# Patient Record
Sex: Female | Born: 1975
Health system: Southern US, Community
[De-identification: ages and names within clinical notes are randomized; demographics above are authoritative.]

## PROBLEM LIST (undated history)

## (undated) DIAGNOSIS — E669 Obesity, unspecified: Secondary | ICD-10-CM

## (undated) DIAGNOSIS — R42 Dizziness and giddiness: Secondary | ICD-10-CM

## (undated) DIAGNOSIS — K5909 Other constipation: Secondary | ICD-10-CM

## (undated) DIAGNOSIS — J45909 Unspecified asthma, uncomplicated: Secondary | ICD-10-CM

## (undated) DIAGNOSIS — F339 Major depressive disorder, recurrent, unspecified: Secondary | ICD-10-CM

## (undated) DIAGNOSIS — F411 Generalized anxiety disorder: Secondary | ICD-10-CM

## (undated) DIAGNOSIS — F4310 Post-traumatic stress disorder, unspecified: Secondary | ICD-10-CM

## (undated) DIAGNOSIS — R7309 Other abnormal glucose: Secondary | ICD-10-CM

## (undated) DIAGNOSIS — D219 Benign neoplasm of connective and other soft tissue, unspecified: Secondary | ICD-10-CM

## (undated) DIAGNOSIS — E0590 Thyrotoxicosis, unspecified without thyrotoxic crisis or storm: Secondary | ICD-10-CM

## (undated) HISTORY — DX: Major depressive disorder, recurrent, unspecified: F33.9

## (undated) HISTORY — DX: Generalized anxiety disorder: F41.1

## (undated) HISTORY — DX: Thyrotoxicosis, unspecified without thyrotoxic crisis or storm: E05.90

## (undated) HISTORY — DX: Dizziness and giddiness: R42

## (undated) HISTORY — DX: Post-traumatic stress disorder, unspecified: F43.10

## (undated) HISTORY — DX: Other constipation: K59.09

## (undated) HISTORY — DX: Unspecified asthma, uncomplicated: J45.909

## (undated) HISTORY — PX: PILONIDAL CYST EXCISION: SHX744

## (undated) HISTORY — DX: Other abnormal glucose: R73.09

## (undated) HISTORY — DX: Obesity, unspecified: E66.9

---

## 1998-02-25 ENCOUNTER — Inpatient Hospital Stay (HOSPITAL_COMMUNITY): Admission: AD | Admit: 1998-02-25 | Discharge: 1998-02-25 | Payer: Self-pay | Admitting: Obstetrics & Gynecology

## 1998-03-16 ENCOUNTER — Inpatient Hospital Stay (HOSPITAL_COMMUNITY): Admission: AD | Admit: 1998-03-16 | Discharge: 1998-03-16 | Payer: Self-pay | Admitting: *Deleted

## 1998-04-06 ENCOUNTER — Inpatient Hospital Stay (HOSPITAL_COMMUNITY): Admission: AD | Admit: 1998-04-06 | Discharge: 1998-04-06 | Payer: Self-pay | Admitting: *Deleted

## 1998-04-09 ENCOUNTER — Inpatient Hospital Stay (HOSPITAL_COMMUNITY): Admission: AD | Admit: 1998-04-09 | Discharge: 1998-04-11 | Payer: Self-pay | Admitting: Obstetrics

## 1998-04-16 ENCOUNTER — Inpatient Hospital Stay (HOSPITAL_COMMUNITY): Admission: AD | Admit: 1998-04-16 | Discharge: 1998-04-16 | Payer: Self-pay | Admitting: Obstetrics

## 1998-04-23 ENCOUNTER — Inpatient Hospital Stay (HOSPITAL_COMMUNITY): Admission: AD | Admit: 1998-04-23 | Discharge: 1998-04-23 | Payer: Self-pay | Admitting: Obstetrics & Gynecology

## 1998-05-14 ENCOUNTER — Inpatient Hospital Stay (HOSPITAL_COMMUNITY): Admission: AD | Admit: 1998-05-14 | Discharge: 1998-05-14 | Payer: Self-pay | Admitting: *Deleted

## 1998-05-17 ENCOUNTER — Inpatient Hospital Stay (HOSPITAL_COMMUNITY): Admission: AD | Admit: 1998-05-17 | Discharge: 1998-05-17 | Payer: Self-pay | Admitting: *Deleted

## 1998-06-07 ENCOUNTER — Inpatient Hospital Stay (HOSPITAL_COMMUNITY): Admission: AD | Admit: 1998-06-07 | Discharge: 1998-06-07 | Payer: Self-pay | Admitting: *Deleted

## 1998-06-08 ENCOUNTER — Inpatient Hospital Stay (HOSPITAL_COMMUNITY): Admission: AD | Admit: 1998-06-08 | Discharge: 1998-06-08 | Payer: Self-pay | Admitting: *Deleted

## 1998-06-24 ENCOUNTER — Inpatient Hospital Stay (HOSPITAL_COMMUNITY): Admission: AD | Admit: 1998-06-24 | Discharge: 1998-06-27 | Payer: Self-pay | Admitting: Obstetrics & Gynecology

## 1998-11-01 ENCOUNTER — Emergency Department (HOSPITAL_COMMUNITY): Admission: EM | Admit: 1998-11-01 | Discharge: 1998-11-01 | Payer: Self-pay | Admitting: Emergency Medicine

## 1999-09-20 ENCOUNTER — Emergency Department (HOSPITAL_COMMUNITY): Admission: EM | Admit: 1999-09-20 | Discharge: 1999-09-20 | Payer: Self-pay | Admitting: Emergency Medicine

## 1999-10-01 ENCOUNTER — Inpatient Hospital Stay (HOSPITAL_COMMUNITY): Admission: AD | Admit: 1999-10-01 | Discharge: 1999-10-01 | Payer: Self-pay | Admitting: *Deleted

## 1999-10-11 ENCOUNTER — Inpatient Hospital Stay (HOSPITAL_COMMUNITY): Admission: AD | Admit: 1999-10-11 | Discharge: 1999-10-11 | Payer: Self-pay | Admitting: *Deleted

## 1999-11-11 ENCOUNTER — Inpatient Hospital Stay (HOSPITAL_COMMUNITY): Admission: AD | Admit: 1999-11-11 | Discharge: 1999-11-11 | Payer: Self-pay | Admitting: *Deleted

## 2000-02-05 ENCOUNTER — Inpatient Hospital Stay (HOSPITAL_COMMUNITY): Admission: AD | Admit: 2000-02-05 | Discharge: 2000-02-05 | Payer: Self-pay | Admitting: *Deleted

## 2001-11-23 ENCOUNTER — Emergency Department (HOSPITAL_COMMUNITY): Admission: EM | Admit: 2001-11-23 | Discharge: 2001-11-24 | Payer: Self-pay | Admitting: Emergency Medicine

## 2002-05-04 ENCOUNTER — Emergency Department (HOSPITAL_COMMUNITY): Admission: EM | Admit: 2002-05-04 | Discharge: 2002-05-04 | Payer: Self-pay | Admitting: Emergency Medicine

## 2003-02-25 ENCOUNTER — Emergency Department (HOSPITAL_COMMUNITY): Admission: EM | Admit: 2003-02-25 | Discharge: 2003-02-26 | Payer: Self-pay | Admitting: Emergency Medicine

## 2003-03-29 ENCOUNTER — Inpatient Hospital Stay (HOSPITAL_COMMUNITY): Admission: AD | Admit: 2003-03-29 | Discharge: 2003-03-29 | Payer: Self-pay | Admitting: Obstetrics

## 2003-03-31 ENCOUNTER — Inpatient Hospital Stay (HOSPITAL_COMMUNITY): Admission: AD | Admit: 2003-03-31 | Discharge: 2003-03-31 | Payer: Self-pay | Admitting: Obstetrics

## 2003-04-01 ENCOUNTER — Ambulatory Visit (HOSPITAL_COMMUNITY): Admission: RE | Admit: 2003-04-01 | Discharge: 2003-04-01 | Payer: Self-pay | Admitting: Obstetrics

## 2003-04-08 ENCOUNTER — Ambulatory Visit (HOSPITAL_COMMUNITY): Admission: RE | Admit: 2003-04-08 | Discharge: 2003-04-08 | Payer: Self-pay | Admitting: Family Medicine

## 2003-04-10 ENCOUNTER — Inpatient Hospital Stay (HOSPITAL_COMMUNITY): Admission: AD | Admit: 2003-04-10 | Discharge: 2003-04-11 | Payer: Self-pay | Admitting: Obstetrics

## 2003-04-16 ENCOUNTER — Inpatient Hospital Stay (HOSPITAL_COMMUNITY): Admission: AD | Admit: 2003-04-16 | Discharge: 2003-04-18 | Payer: Self-pay | Admitting: Obstetrics

## 2003-04-30 ENCOUNTER — Inpatient Hospital Stay (HOSPITAL_COMMUNITY): Admission: AD | Admit: 2003-04-30 | Discharge: 2003-05-05 | Payer: Self-pay | Admitting: Obstetrics

## 2003-05-20 ENCOUNTER — Inpatient Hospital Stay (HOSPITAL_COMMUNITY): Admission: AD | Admit: 2003-05-20 | Discharge: 2003-05-25 | Payer: Self-pay | Admitting: Obstetrics

## 2003-08-26 ENCOUNTER — Inpatient Hospital Stay (HOSPITAL_COMMUNITY): Admission: AD | Admit: 2003-08-26 | Discharge: 2003-08-26 | Payer: Self-pay | Admitting: Obstetrics

## 2003-10-02 ENCOUNTER — Observation Stay (HOSPITAL_COMMUNITY): Admission: AD | Admit: 2003-10-02 | Discharge: 2003-10-03 | Payer: Self-pay | Admitting: Obstetrics

## 2003-10-13 ENCOUNTER — Ambulatory Visit (HOSPITAL_COMMUNITY): Admission: RE | Admit: 2003-10-13 | Discharge: 2003-10-13 | Payer: Self-pay | Admitting: Obstetrics

## 2003-11-02 ENCOUNTER — Inpatient Hospital Stay (HOSPITAL_COMMUNITY): Admission: AD | Admit: 2003-11-02 | Discharge: 2003-11-02 | Payer: Self-pay | Admitting: Obstetrics

## 2003-11-16 ENCOUNTER — Inpatient Hospital Stay (HOSPITAL_COMMUNITY): Admission: AD | Admit: 2003-11-16 | Discharge: 2003-11-18 | Payer: Self-pay | Admitting: Obstetrics

## 2004-02-23 ENCOUNTER — Ambulatory Visit: Payer: Self-pay | Admitting: Family Medicine

## 2004-03-25 ENCOUNTER — Ambulatory Visit: Payer: Self-pay | Admitting: Sports Medicine

## 2004-03-30 ENCOUNTER — Inpatient Hospital Stay (HOSPITAL_COMMUNITY): Admission: AD | Admit: 2004-03-30 | Discharge: 2004-03-30 | Payer: Self-pay | Admitting: Obstetrics and Gynecology

## 2004-04-01 ENCOUNTER — Ambulatory Visit: Payer: Self-pay | Admitting: Sports Medicine

## 2004-05-10 ENCOUNTER — Ambulatory Visit: Payer: Self-pay | Admitting: Family Medicine

## 2004-06-14 ENCOUNTER — Ambulatory Visit: Payer: Self-pay | Admitting: Family Medicine

## 2004-06-29 ENCOUNTER — Emergency Department (HOSPITAL_COMMUNITY): Admission: EM | Admit: 2004-06-29 | Discharge: 2004-06-29 | Payer: Self-pay | Admitting: Emergency Medicine

## 2004-08-02 ENCOUNTER — Ambulatory Visit: Payer: Self-pay | Admitting: Family Medicine

## 2004-08-08 ENCOUNTER — Emergency Department (HOSPITAL_COMMUNITY): Admission: EM | Admit: 2004-08-08 | Discharge: 2004-08-08 | Payer: Self-pay | Admitting: Emergency Medicine

## 2004-08-09 ENCOUNTER — Emergency Department (HOSPITAL_COMMUNITY): Admission: EM | Admit: 2004-08-09 | Discharge: 2004-08-09 | Payer: Self-pay | Admitting: Emergency Medicine

## 2004-09-27 ENCOUNTER — Ambulatory Visit: Payer: Self-pay | Admitting: Family Medicine

## 2004-10-10 ENCOUNTER — Ambulatory Visit: Payer: Self-pay | Admitting: Family Medicine

## 2004-10-11 ENCOUNTER — Ambulatory Visit: Payer: Self-pay | Admitting: Family Medicine

## 2004-10-17 ENCOUNTER — Ambulatory Visit: Payer: Self-pay | Admitting: Family Medicine

## 2004-10-18 ENCOUNTER — Ambulatory Visit: Payer: Self-pay | Admitting: Family Medicine

## 2004-10-28 ENCOUNTER — Ambulatory Visit: Payer: Self-pay | Admitting: Family Medicine

## 2004-12-12 ENCOUNTER — Ambulatory Visit: Payer: Self-pay | Admitting: Psychology

## 2004-12-22 ENCOUNTER — Ambulatory Visit: Payer: Self-pay | Admitting: Family Medicine

## 2004-12-28 ENCOUNTER — Ambulatory Visit: Payer: Self-pay | Admitting: Psychology

## 2005-01-11 ENCOUNTER — Ambulatory Visit: Payer: Self-pay | Admitting: Family Medicine

## 2005-02-17 ENCOUNTER — Ambulatory Visit: Payer: Self-pay | Admitting: Family Medicine

## 2005-04-04 ENCOUNTER — Ambulatory Visit: Payer: Self-pay | Admitting: Family Medicine

## 2005-04-05 ENCOUNTER — Encounter: Admission: RE | Admit: 2005-04-05 | Discharge: 2005-04-05 | Payer: Self-pay | Admitting: Sports Medicine

## 2005-07-21 ENCOUNTER — Emergency Department (HOSPITAL_COMMUNITY): Admission: EM | Admit: 2005-07-21 | Discharge: 2005-07-21 | Payer: Self-pay | Admitting: Family Medicine

## 2005-07-25 ENCOUNTER — Ambulatory Visit: Payer: Self-pay | Admitting: Sports Medicine

## 2005-09-05 ENCOUNTER — Ambulatory Visit: Payer: Self-pay | Admitting: Sports Medicine

## 2005-09-07 ENCOUNTER — Ambulatory Visit (HOSPITAL_COMMUNITY): Admission: RE | Admit: 2005-09-07 | Discharge: 2005-09-07 | Payer: Self-pay | Admitting: Family Medicine

## 2005-11-07 ENCOUNTER — Ambulatory Visit: Payer: Self-pay | Admitting: Family Medicine

## 2005-11-09 ENCOUNTER — Ambulatory Visit: Payer: Self-pay | Admitting: Family Medicine

## 2005-11-10 ENCOUNTER — Ambulatory Visit: Payer: Self-pay | Admitting: Family Medicine

## 2005-11-13 ENCOUNTER — Ambulatory Visit: Payer: Self-pay | Admitting: Family Medicine

## 2005-11-17 ENCOUNTER — Emergency Department (HOSPITAL_COMMUNITY): Admission: EM | Admit: 2005-11-17 | Discharge: 2005-11-17 | Payer: Self-pay | Admitting: Family Medicine

## 2006-01-09 ENCOUNTER — Ambulatory Visit: Payer: Self-pay | Admitting: Family Medicine

## 2006-02-02 ENCOUNTER — Ambulatory Visit: Payer: Self-pay | Admitting: Family Medicine

## 2006-02-22 ENCOUNTER — Emergency Department (HOSPITAL_COMMUNITY): Admission: EM | Admit: 2006-02-22 | Discharge: 2006-02-22 | Payer: Self-pay | Admitting: Family Medicine

## 2006-02-26 ENCOUNTER — Encounter (INDEPENDENT_AMBULATORY_CARE_PROVIDER_SITE_OTHER): Payer: Self-pay | Admitting: *Deleted

## 2006-02-26 HISTORY — PX: IUD REMOVAL: SHX5392

## 2006-03-06 ENCOUNTER — Encounter (INDEPENDENT_AMBULATORY_CARE_PROVIDER_SITE_OTHER): Payer: Self-pay | Admitting: *Deleted

## 2006-03-06 ENCOUNTER — Ambulatory Visit: Payer: Self-pay | Admitting: Family Medicine

## 2006-03-06 ENCOUNTER — Other Ambulatory Visit: Admission: RE | Admit: 2006-03-06 | Discharge: 2006-03-06 | Payer: Self-pay | Admitting: Family Medicine

## 2006-03-25 ENCOUNTER — Emergency Department (HOSPITAL_COMMUNITY): Admission: EM | Admit: 2006-03-25 | Discharge: 2006-03-25 | Payer: Self-pay | Admitting: Family Medicine

## 2006-03-31 ENCOUNTER — Emergency Department (HOSPITAL_COMMUNITY): Admission: EM | Admit: 2006-03-31 | Discharge: 2006-03-31 | Payer: Self-pay | Admitting: Family Medicine

## 2006-05-06 ENCOUNTER — Emergency Department (HOSPITAL_COMMUNITY): Admission: AD | Admit: 2006-05-06 | Discharge: 2006-05-06 | Payer: Self-pay | Admitting: Family Medicine

## 2006-05-29 HISTORY — PX: TUBAL LIGATION: SHX77

## 2006-06-18 ENCOUNTER — Inpatient Hospital Stay (HOSPITAL_COMMUNITY): Admission: AD | Admit: 2006-06-18 | Discharge: 2006-06-18 | Payer: Self-pay | Admitting: Obstetrics & Gynecology

## 2006-06-28 ENCOUNTER — Inpatient Hospital Stay (HOSPITAL_COMMUNITY): Admission: AD | Admit: 2006-06-28 | Discharge: 2006-06-28 | Payer: Self-pay | Admitting: Gynecology

## 2006-07-04 ENCOUNTER — Inpatient Hospital Stay (HOSPITAL_COMMUNITY): Admission: AD | Admit: 2006-07-04 | Discharge: 2006-07-05 | Payer: Self-pay | Admitting: Obstetrics and Gynecology

## 2006-07-17 ENCOUNTER — Inpatient Hospital Stay (HOSPITAL_COMMUNITY): Admission: AD | Admit: 2006-07-17 | Discharge: 2006-07-17 | Payer: Self-pay | Admitting: Obstetrics and Gynecology

## 2006-07-26 DIAGNOSIS — F411 Generalized anxiety disorder: Secondary | ICD-10-CM

## 2006-07-26 DIAGNOSIS — F339 Major depressive disorder, recurrent, unspecified: Secondary | ICD-10-CM

## 2006-07-26 DIAGNOSIS — J45909 Unspecified asthma, uncomplicated: Secondary | ICD-10-CM

## 2006-07-26 DIAGNOSIS — K5900 Constipation, unspecified: Secondary | ICD-10-CM | POA: Insufficient documentation

## 2006-07-26 DIAGNOSIS — F39 Unspecified mood [affective] disorder: Secondary | ICD-10-CM

## 2006-07-26 HISTORY — DX: Unspecified asthma, uncomplicated: J45.909

## 2006-07-26 HISTORY — DX: Generalized anxiety disorder: F41.1

## 2006-07-26 HISTORY — DX: Major depressive disorder, recurrent, unspecified: F33.9

## 2006-07-27 ENCOUNTER — Encounter (INDEPENDENT_AMBULATORY_CARE_PROVIDER_SITE_OTHER): Payer: Self-pay | Admitting: *Deleted

## 2006-07-30 ENCOUNTER — Ambulatory Visit: Payer: Self-pay | Admitting: Endocrinology

## 2006-09-20 ENCOUNTER — Ambulatory Visit: Payer: Self-pay | Admitting: Endocrinology

## 2006-09-21 ENCOUNTER — Inpatient Hospital Stay (HOSPITAL_COMMUNITY): Admission: AD | Admit: 2006-09-21 | Discharge: 2006-09-22 | Payer: Self-pay | Admitting: Obstetrics and Gynecology

## 2006-10-01 ENCOUNTER — Inpatient Hospital Stay (HOSPITAL_COMMUNITY): Admission: AD | Admit: 2006-10-01 | Discharge: 2006-10-01 | Payer: Self-pay | Admitting: Obstetrics and Gynecology

## 2006-11-07 ENCOUNTER — Inpatient Hospital Stay (HOSPITAL_COMMUNITY): Admission: AD | Admit: 2006-11-07 | Discharge: 2006-11-07 | Payer: Self-pay | Admitting: Obstetrics and Gynecology

## 2006-11-18 ENCOUNTER — Inpatient Hospital Stay (HOSPITAL_COMMUNITY): Admission: AD | Admit: 2006-11-18 | Discharge: 2006-11-19 | Payer: Self-pay | Admitting: Obstetrics and Gynecology

## 2006-11-18 ENCOUNTER — Emergency Department (HOSPITAL_COMMUNITY): Admission: EM | Admit: 2006-11-18 | Discharge: 2006-11-18 | Payer: Self-pay | Admitting: Family Medicine

## 2006-12-31 ENCOUNTER — Ambulatory Visit: Payer: Self-pay | Admitting: Endocrinology

## 2006-12-31 LAB — CONVERTED CEMR LAB
Free T4: 0.5 ng/dL — ABNORMAL LOW (ref 0.6–1.6)
TSH: 1.07 microintl units/mL (ref 0.35–5.50)

## 2007-01-03 ENCOUNTER — Ambulatory Visit: Payer: Self-pay | Admitting: Endocrinology

## 2007-01-20 ENCOUNTER — Inpatient Hospital Stay (HOSPITAL_COMMUNITY): Admission: AD | Admit: 2007-01-20 | Discharge: 2007-01-21 | Payer: Self-pay | Admitting: Obstetrics and Gynecology

## 2007-01-27 ENCOUNTER — Inpatient Hospital Stay (HOSPITAL_COMMUNITY): Admission: AD | Admit: 2007-01-27 | Discharge: 2007-01-27 | Payer: Self-pay | Admitting: Obstetrics and Gynecology

## 2007-01-30 ENCOUNTER — Inpatient Hospital Stay (HOSPITAL_COMMUNITY): Admission: AD | Admit: 2007-01-30 | Discharge: 2007-02-02 | Payer: Self-pay | Admitting: Obstetrics and Gynecology

## 2007-02-01 ENCOUNTER — Encounter (INDEPENDENT_AMBULATORY_CARE_PROVIDER_SITE_OTHER): Payer: Self-pay | Admitting: Obstetrics and Gynecology

## 2007-02-09 ENCOUNTER — Emergency Department (HOSPITAL_COMMUNITY): Admission: EM | Admit: 2007-02-09 | Discharge: 2007-02-09 | Payer: Self-pay | Admitting: Emergency Medicine

## 2007-03-28 ENCOUNTER — Emergency Department (HOSPITAL_COMMUNITY): Admission: EM | Admit: 2007-03-28 | Discharge: 2007-03-28 | Payer: Self-pay | Admitting: Family Medicine

## 2007-05-01 ENCOUNTER — Telehealth: Payer: Self-pay | Admitting: *Deleted

## 2007-05-01 ENCOUNTER — Emergency Department (HOSPITAL_COMMUNITY): Admission: EM | Admit: 2007-05-01 | Discharge: 2007-05-01 | Payer: Self-pay | Admitting: Family Medicine

## 2007-05-30 ENCOUNTER — Emergency Department (HOSPITAL_COMMUNITY): Admission: EM | Admit: 2007-05-30 | Discharge: 2007-05-30 | Payer: Self-pay | Admitting: Emergency Medicine

## 2007-07-19 ENCOUNTER — Ambulatory Visit: Payer: Self-pay | Admitting: Family Medicine

## 2007-07-19 ENCOUNTER — Telehealth: Payer: Self-pay | Admitting: *Deleted

## 2007-07-22 ENCOUNTER — Telehealth: Payer: Self-pay | Admitting: *Deleted

## 2007-08-14 ENCOUNTER — Emergency Department (HOSPITAL_COMMUNITY): Admission: EM | Admit: 2007-08-14 | Discharge: 2007-08-14 | Payer: Self-pay | Admitting: Emergency Medicine

## 2007-08-14 ENCOUNTER — Ambulatory Visit: Payer: Self-pay | Admitting: Endocrinology

## 2007-08-14 LAB — CONVERTED CEMR LAB: Free T4: 0.9 ng/dL (ref 0.6–1.6)

## 2007-08-20 ENCOUNTER — Telehealth: Payer: Self-pay | Admitting: Endocrinology

## 2008-01-31 ENCOUNTER — Ambulatory Visit: Payer: Self-pay | Admitting: Family Medicine

## 2008-01-31 ENCOUNTER — Encounter: Payer: Self-pay | Admitting: Family Medicine

## 2008-01-31 DIAGNOSIS — E669 Obesity, unspecified: Secondary | ICD-10-CM

## 2008-01-31 HISTORY — DX: Obesity, unspecified: E66.9

## 2008-02-17 ENCOUNTER — Emergency Department (HOSPITAL_COMMUNITY): Admission: EM | Admit: 2008-02-17 | Discharge: 2008-02-17 | Payer: Self-pay | Admitting: Family Medicine

## 2008-02-19 ENCOUNTER — Telehealth: Payer: Self-pay | Admitting: *Deleted

## 2008-02-21 ENCOUNTER — Encounter: Payer: Self-pay | Admitting: Family Medicine

## 2008-02-28 ENCOUNTER — Telehealth: Payer: Self-pay | Admitting: Family Medicine

## 2008-03-02 ENCOUNTER — Ambulatory Visit: Payer: Self-pay | Admitting: Endocrinology

## 2008-05-01 ENCOUNTER — Ambulatory Visit: Payer: Self-pay | Admitting: Family Medicine

## 2008-05-27 ENCOUNTER — Emergency Department (HOSPITAL_COMMUNITY): Admission: EM | Admit: 2008-05-27 | Discharge: 2008-05-27 | Payer: Self-pay | Admitting: Family Medicine

## 2008-05-31 ENCOUNTER — Emergency Department (HOSPITAL_COMMUNITY): Admission: EM | Admit: 2008-05-31 | Discharge: 2008-05-31 | Payer: Self-pay | Admitting: Family Medicine

## 2008-07-08 ENCOUNTER — Telehealth: Payer: Self-pay | Admitting: *Deleted

## 2008-07-28 ENCOUNTER — Emergency Department (HOSPITAL_COMMUNITY): Admission: EM | Admit: 2008-07-28 | Discharge: 2008-07-28 | Payer: Self-pay | Admitting: Family Medicine

## 2008-08-09 ENCOUNTER — Emergency Department (HOSPITAL_COMMUNITY): Admission: EM | Admit: 2008-08-09 | Discharge: 2008-08-09 | Payer: Self-pay | Admitting: Family Medicine

## 2008-08-13 ENCOUNTER — Encounter: Payer: Self-pay | Admitting: *Deleted

## 2008-10-27 ENCOUNTER — Emergency Department (HOSPITAL_COMMUNITY): Admission: EM | Admit: 2008-10-27 | Discharge: 2008-10-27 | Payer: Self-pay | Admitting: Family Medicine

## 2008-11-04 ENCOUNTER — Ambulatory Visit: Payer: Self-pay | Admitting: Family Medicine

## 2008-11-04 ENCOUNTER — Encounter: Payer: Self-pay | Admitting: *Deleted

## 2008-12-28 ENCOUNTER — Telehealth: Payer: Self-pay | Admitting: *Deleted

## 2009-01-01 ENCOUNTER — Encounter: Payer: Self-pay | Admitting: Family Medicine

## 2009-01-01 ENCOUNTER — Ambulatory Visit: Payer: Self-pay | Admitting: Family Medicine

## 2009-01-01 DIAGNOSIS — L708 Other acne: Secondary | ICD-10-CM | POA: Insufficient documentation

## 2009-01-06 ENCOUNTER — Ambulatory Visit: Payer: Self-pay | Admitting: Endocrinology

## 2009-01-19 ENCOUNTER — Telehealth: Payer: Self-pay | Admitting: Internal Medicine

## 2009-01-20 ENCOUNTER — Ambulatory Visit: Payer: Self-pay | Admitting: Internal Medicine

## 2009-01-21 ENCOUNTER — Telehealth: Payer: Self-pay | Admitting: Family Medicine

## 2009-01-22 ENCOUNTER — Encounter: Payer: Self-pay | Admitting: *Deleted

## 2009-02-11 ENCOUNTER — Emergency Department (HOSPITAL_COMMUNITY): Admission: EM | Admit: 2009-02-11 | Discharge: 2009-02-11 | Payer: Self-pay | Admitting: Emergency Medicine

## 2009-02-22 ENCOUNTER — Ambulatory Visit: Payer: Self-pay | Admitting: Endocrinology

## 2009-02-22 ENCOUNTER — Encounter (INDEPENDENT_AMBULATORY_CARE_PROVIDER_SITE_OTHER): Payer: Self-pay | Admitting: *Deleted

## 2009-02-25 LAB — CONVERTED CEMR LAB
Free T4: 0.8 ng/dL (ref 0.6–1.6)
TSH: 0.02 microintl units/mL — ABNORMAL LOW (ref 0.35–5.50)

## 2009-03-01 ENCOUNTER — Emergency Department (HOSPITAL_COMMUNITY): Admission: EM | Admit: 2009-03-01 | Discharge: 2009-03-02 | Payer: Self-pay | Admitting: Emergency Medicine

## 2009-03-16 ENCOUNTER — Telehealth: Payer: Self-pay | Admitting: *Deleted

## 2009-03-29 ENCOUNTER — Ambulatory Visit: Payer: Self-pay | Admitting: Endocrinology

## 2009-03-31 ENCOUNTER — Ambulatory Visit: Payer: Self-pay | Admitting: Endocrinology

## 2009-03-31 LAB — CONVERTED CEMR LAB: TSH: 0.47 microintl units/mL (ref 0.35–5.50)

## 2009-07-15 ENCOUNTER — Emergency Department (HOSPITAL_COMMUNITY): Admission: EM | Admit: 2009-07-15 | Discharge: 2009-07-15 | Payer: Self-pay | Admitting: Emergency Medicine

## 2009-07-20 ENCOUNTER — Ambulatory Visit: Payer: Self-pay | Admitting: Endocrinology

## 2009-07-20 DIAGNOSIS — E0590 Thyrotoxicosis, unspecified without thyrotoxic crisis or storm: Secondary | ICD-10-CM

## 2009-07-20 HISTORY — DX: Thyrotoxicosis, unspecified without thyrotoxic crisis or storm: E05.90

## 2009-07-20 LAB — CONVERTED CEMR LAB: TSH: 1.77 microintl units/mL (ref 0.35–5.50)

## 2009-08-27 ENCOUNTER — Emergency Department (HOSPITAL_COMMUNITY): Admission: EM | Admit: 2009-08-27 | Discharge: 2009-08-27 | Payer: Self-pay | Admitting: Internal Medicine

## 2009-09-17 ENCOUNTER — Emergency Department (HOSPITAL_COMMUNITY): Admission: EM | Admit: 2009-09-17 | Discharge: 2009-09-17 | Payer: Self-pay | Admitting: Family Medicine

## 2009-09-27 ENCOUNTER — Emergency Department (HOSPITAL_COMMUNITY): Admission: EM | Admit: 2009-09-27 | Discharge: 2009-09-27 | Payer: Self-pay | Admitting: Emergency Medicine

## 2009-09-28 ENCOUNTER — Ambulatory Visit: Payer: Self-pay | Admitting: Family Medicine

## 2009-09-28 ENCOUNTER — Encounter: Payer: Self-pay | Admitting: Family Medicine

## 2009-09-28 DIAGNOSIS — R7309 Other abnormal glucose: Secondary | ICD-10-CM

## 2009-09-28 HISTORY — DX: Other abnormal glucose: R73.09

## 2009-09-29 ENCOUNTER — Ambulatory Visit: Payer: Self-pay | Admitting: Family Medicine

## 2009-09-30 ENCOUNTER — Ambulatory Visit: Payer: Self-pay | Admitting: Endocrinology

## 2009-10-05 ENCOUNTER — Telehealth (INDEPENDENT_AMBULATORY_CARE_PROVIDER_SITE_OTHER): Payer: Self-pay | Admitting: *Deleted

## 2009-10-05 ENCOUNTER — Encounter: Admission: RE | Admit: 2009-10-05 | Discharge: 2009-10-19 | Payer: Self-pay | Admitting: Family Medicine

## 2009-10-14 ENCOUNTER — Telehealth: Payer: Self-pay | Admitting: Family Medicine

## 2009-10-19 ENCOUNTER — Encounter: Payer: Self-pay | Admitting: Family Medicine

## 2009-10-20 ENCOUNTER — Emergency Department (HOSPITAL_COMMUNITY): Admission: EM | Admit: 2009-10-20 | Discharge: 2009-10-20 | Payer: Self-pay | Admitting: Internal Medicine

## 2009-11-24 ENCOUNTER — Emergency Department (HOSPITAL_COMMUNITY): Admission: EM | Admit: 2009-11-24 | Discharge: 2009-11-24 | Payer: Self-pay | Admitting: Emergency Medicine

## 2009-11-26 ENCOUNTER — Ambulatory Visit: Payer: Self-pay | Admitting: Family Medicine

## 2009-11-26 ENCOUNTER — Telehealth: Payer: Self-pay | Admitting: Family Medicine

## 2009-11-26 ENCOUNTER — Ambulatory Visit (HOSPITAL_COMMUNITY): Admission: RE | Admit: 2009-11-26 | Discharge: 2009-11-26 | Payer: Self-pay | Admitting: Family Medicine

## 2009-11-26 DIAGNOSIS — M2550 Pain in unspecified joint: Secondary | ICD-10-CM

## 2009-12-30 ENCOUNTER — Ambulatory Visit: Payer: Self-pay | Admitting: Family Medicine

## 2010-01-18 ENCOUNTER — Encounter: Admission: RE | Admit: 2010-01-18 | Discharge: 2010-03-09 | Payer: Self-pay | Admitting: Family Medicine

## 2010-01-28 ENCOUNTER — Ambulatory Visit: Payer: Self-pay | Admitting: Endocrinology

## 2010-02-16 ENCOUNTER — Encounter: Payer: Self-pay | Admitting: Family Medicine

## 2010-02-25 ENCOUNTER — Ambulatory Visit: Payer: Self-pay | Admitting: Family Medicine

## 2010-03-18 ENCOUNTER — Ambulatory Visit: Payer: Self-pay | Admitting: Family Medicine

## 2010-03-18 DIAGNOSIS — R079 Chest pain, unspecified: Secondary | ICD-10-CM | POA: Insufficient documentation

## 2010-03-29 ENCOUNTER — Encounter: Payer: Self-pay | Admitting: Family Medicine

## 2010-04-11 ENCOUNTER — Encounter: Payer: Self-pay | Admitting: Family Medicine

## 2010-04-14 ENCOUNTER — Encounter: Payer: Self-pay | Admitting: Family Medicine

## 2010-05-05 ENCOUNTER — Ambulatory Visit: Payer: Self-pay | Admitting: Family Medicine

## 2010-05-09 ENCOUNTER — Ambulatory Visit: Payer: Self-pay | Admitting: Endocrinology

## 2010-05-10 LAB — CONVERTED CEMR LAB: TSH: 1.35 microintl units/mL (ref 0.35–5.50)

## 2010-05-12 ENCOUNTER — Telehealth: Payer: Self-pay | Admitting: Endocrinology

## 2010-06-19 ENCOUNTER — Encounter: Payer: Self-pay | Admitting: Obstetrics

## 2010-06-19 ENCOUNTER — Encounter: Payer: Self-pay | Admitting: Endocrinology

## 2010-06-22 ENCOUNTER — Ambulatory Visit
Admission: RE | Admit: 2010-06-22 | Discharge: 2010-06-22 | Payer: Self-pay | Source: Home / Self Care | Attending: Family Medicine | Admitting: Family Medicine

## 2010-06-22 DIAGNOSIS — K5909 Other constipation: Secondary | ICD-10-CM | POA: Insufficient documentation

## 2010-06-22 HISTORY — DX: Other constipation: K59.09

## 2010-06-23 ENCOUNTER — Ambulatory Visit: Admission: RE | Admit: 2010-06-23 | Discharge: 2010-06-23 | Payer: Self-pay | Source: Home / Self Care

## 2010-06-23 ENCOUNTER — Other Ambulatory Visit: Payer: Self-pay

## 2010-06-23 ENCOUNTER — Ambulatory Visit
Admission: RE | Admit: 2010-06-23 | Discharge: 2010-06-23 | Payer: Self-pay | Source: Home / Self Care | Attending: Family Medicine | Admitting: Family Medicine

## 2010-06-23 ENCOUNTER — Encounter: Payer: Self-pay | Admitting: Family Medicine

## 2010-06-23 DIAGNOSIS — R5381 Other malaise: Secondary | ICD-10-CM | POA: Insufficient documentation

## 2010-06-23 DIAGNOSIS — R5383 Other fatigue: Secondary | ICD-10-CM

## 2010-06-23 LAB — CONVERTED CEMR LAB
Platelets: 351 10*3/uL (ref 150–400)
Sodium: 136 meq/L (ref 135–145)
T3, Free: 3.2 pg/mL (ref 2.3–4.2)
TSH: 1.328 microintl units/mL (ref 0.350–4.500)

## 2010-06-24 ENCOUNTER — Encounter: Payer: Self-pay | Admitting: Family Medicine

## 2010-06-28 NOTE — Consult Note (Signed)
Summary: [facility]  [facility]   Imported By: Clydell Hakim 03/09/2010 09:14:58  _____________________________________________________________________  External Attachment:    Type:   Image     Comment:   External Document
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
## 2010-06-28 NOTE — Assessment & Plan Note (Signed)
Summary: post hosp/#/cd   Vital Signs:  Patient profile:   34 year old female Height:      71 inches (180.34 cm) Weight:      234.25 pounds (106.48 kg) O2 Sat:      97 % on Room air Temp:     96.8 degrees F (36 degrees C) oral Pulse rate:   73 / minute BP sitting:   104 / 72  (left arm) Cuff size:   large  Vitals Entered By: Josph Macho RMA (Sep 30, 2009 8:16 AM)  O2 Flow:  Room air CC: Post Hospital follow up/ refill on Methimazole/ pt states she needs to pick up med for Vertigo but doesn't remember the name of med/ CF Is Patient Diabetic? No   Primary Provider:  pratt  CC:  Post Hospital follow up/ refill on Methimazole/ pt states she needs to pick up med for Vertigo but doesn't remember the name of med/ CF.  History of Present Illness: pt was seen in er 3 days ago for dizziness.  she still has sxs.  she takes tapazole 5 mg once daily, as rx'ed.  Current Medications (verified): 1)  Albuterol .... 2 Puffs Q 4 Hours As Needed 2)  Methimazole 5 Mg Tabs (Methimazole) .Marland Kitchen.. 1 Daily  Allergies (verified): No Known Drug Allergies  Past History:  Past Medical History: Last updated: 07/20/2009 HYPERTHYROIDISM (ICD-242.90)  Review of Systems  The patient denies fever.    Physical Exam  General:  no distress  Neck:  thyroid is 3x normal size, no nodule Additional Exam:  tsh in emergancy room was normal   Impression & Recommendations:  Problem # 1:  HYPERTHYROIDISM (ICD-242.90) well-controlled  Other Orders: Est. Patient Level III (16109)  Patient Instructions: 1)  continue methimazole 5 mg once daily. 2)  if ever you have fever while taking this medication, stop it and call us, because of the risk of a rare side-effect 3)  return 4 months Prescriptions: METHIMAZOLE 5 MG TABS (METHIMAZOLE) 1 daily  #30 Tablet x 4   Entered and Authorized by:   Minus Breeding MD   Signed by:   Minus Breeding MD on 09/30/2009   Method used:   Electronically to   [facility] [address] [phone]* (retail)       [address]       [place], [place]  [address]       Ph: [phone]       Fax: [phone]   RxID:   [id number]
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
## 2010-06-28 NOTE — Assessment & Plan Note (Signed)
Summary: REMOVAL HOLTER MONITOR/[facility]  Nurse Visit Patient in office to remove Holter Monitor. Electrodes are misplaced. one is on left breast and one is on  right bra on outside.. holter monitor removed. Theresia Lo RN  Sep 30, 2009 9:58 AM   holter monitor taken to EKG lab at [facility] and given to [place]. she will read report and contact us if needs to be repeated. Theresia Lo RN  Sep 30, 2009 9:59 AM   Allergies: 1)  ! Penicillin  Orders Added: 1)  No Charge Patient Arrived (NCPA0) [NCPA0]  Appended Document: REMOVAL HOLTER MONITOR/[facility] [place] calls back stating she is able to read Holter moniter with the one lead that was still in place on chest.

## 2010-06-28 NOTE — Progress Notes (Signed)
Summary: Holter Results  Phone Note Outgoing Call Call back at Home Phone [phone]   Call placed by: Romero Belling MD,  Oct 14, 2009 9:03 AM Call placed to: Patient Summary of Call: Called to discuss Holter monitor results.  No answer.  Left voicemail that Holter was relatively normal.  Asked to call office if she hasn't heard from [place] about appointment for stress test.  Advised to call if she has chest pain, dyspnea, increasing dizziness.  Holter monitor (Sinus with PAC, CP/SOB/dizzy associated with NSR) report placed in to be scanned box.

## 2010-06-28 NOTE — Assessment & Plan Note (Signed)
Summary: f/u appt per pt/#/cd   Vital Signs:  Patient profile:   35 year old female Height:      71 inches (180.34 cm) Weight:      235.13 pounds (106.88 kg) BMI:     32.91 O2 Sat:      97 % on Room air Temp:     97.0 degrees F (36.11 degrees C) oral Pulse rate:   74 / minute BP sitting:   108 / 70  (left arm) Cuff size:   large  Vitals Entered By: Josph Macho RMA (July 20, 2009 3:07 PM)  O2 Flow:  Room air CC: Follow-up visit/ CF   CC:  Follow-up visit/ CF.  History of Present Illness: pt has refused i-131 rx for her hyperthyroidism.  she takes the methimazole 5 mg once daily, as rx'ed.  she reports palpitations, "sweaty palms," and lightheadedness.  sxs are worse just prior to menses.  Current Medications (verified): 1)  Albuterol .... 2 Puffs Q 4 Hours As Needed 2)  Methimazole 5 Mg Tabs (Methimazole) .Marland Kitchen.. 1 Daily  Allergies (verified): No Known Drug Allergies  Past History:  Past Medical History: HYPERTHYROIDISM (ICD-242.90)  Review of Systems  The patient denies fever.         she also has fatigue  Physical Exam  General:  normal appearance.   Neck:  thyroid is 3x normal size, no nodule Neurologic:  no tremor Skin:  not diaphoretic Additional Exam:  FastTSH                   1.77 uIU/mL                 0.35-5.50 Free T4                   0.7 ng/dL    Impression & Recommendations:  Problem # 1:  HYPERTHYROIDISM (ICD-242.90) well-controlled  Medications Added to Medication List This Visit: 1)  Albuterol  .... 2 puffs q 4 hours as needed 2)  Methimazole 5 Mg Tabs (Methimazole) .Marland Kitchen.. 1 daily  Other Orders: TLB-TSH (Thyroid Stimulating Hormone) (84443-TSH) TLB-T4 (Thyrox), Free [phone]) Est. Patient Level III (18299)  Patient Instructions: 1)  tests are being ordered for you today.  a few days after the test(s), please call [phone] to hear your test results. 2)  pending the test results, please continue the same medications  for now 3)  Please schedule a follow-up appointment in 3 months.

## 2010-06-28 NOTE — Consult Note (Signed)
Summary: [facility]-Discharge Summary  [facility]-Discharge Summary   Imported By: Clydell Hakim 10/29/2009 11:27:12  _____________________________________________________________________  External Attachment:    Type:   Image     Comment:   External Document

## 2010-06-28 NOTE — Assessment & Plan Note (Signed)
Summary: poor circulation,df   Vital Signs:  Patient profile:   35 year old female Height:      70 inches Weight:      233.7 pounds BMI:     33.65 Temp:     97.9 degrees F oral Pulse rate:   77 / minute BP sitting:   105 / 68  (left arm) Cuff size:   regular  Vitals Entered By: Garen Grams LPN (Sep 28, 1608 2:58 PM) CC: dizziness and numbness in left arm Is Patient Diabetic? No Pain Assessment Patient in pain? no        Primary Care Provider:  Tinnie Gens MD  CC:  dizziness and numbness in left arm.  History of Present Illness: 35 yo female with 1-1/2 week h/o dizziness with standing.  Dizziness occurs throughout the day anytime she stands.  It is not associated with exertion or relieved by rest.  It is associated with left axillary pain, tingling in the left arm, dyspnea, palpitations. Seen in ED yesterday for dizziness.  Diagnosed with Vertigo, discharged on Meclizine.  Labs, EKG, radiology from that visit reviewed as below:   - CMet:  Hypokalemia (3.4), Hyperglycemia (110)  - U/A:  wnl  - CBC w/ diff:  wnl  - Upreg:  negative  - EKG:  NSR @ 83 bpm, normal EKG  - No radiology  - Orthostatics  PMH significant for hyperthyroidism.  Habits & Providers  Alcohol-Tobacco-Diet     Tobacco Status: never  Current Medications (verified): 1)  Albuterol 90 Mcg/act  Aers (Albuterol) .... 2 Puffs Q 4 Hours As Needed 2)  Methimazole 5 Mg Tabs (Methimazole) .Marland Kitchen.. 1 Qd 3)  Antivert 25 Mg Tabs (Meclizine Hcl) .Marland Kitchen.. 1 Tab By Mouth Tid As Needed Dizziness  Allergies (verified): 1)  ! Penicillin  Physical Exam  Additional Exam:  VITALS:  Reviewed, normal GEN: Alert & oriented, no acute distress NECK: Midline trachea, no masses/thyromegaly, no cervical lymphadenopathy CARDIO: Regular rate and rhythm, no murmurs/rubs/gallops, 2+ bilateral radial pulses RESP: Clear to auscultation, normal work of breathing, no retractions/accessory muscle use EXT: Nontender, no edema NEURO:   CN II-XII intact, 5/5 strength x4 extremities EYES:  No corneal or conjunctival inflammation noted. EOMI. PERRLA.  Vision grossly normal. EARS:  External ear without significant lesions or deformities.  Clear canals, TM intact bilaterally without bulging, retraction, inflammation or discharge. Hearing grossly normal bilaterally. MOUTH:  Oral mucosa and oropharynx without lesions or exudates.    Impression & Recommendations:  Problem # 1:  DIZZINESS (ICD-780.4) Assessment New  Likely vertigo.  Refer for vestibular exercises and continue Antivert.  Given concomittant symptoms, will also check 24h Holter and refer for exercise stress test. Her updated medication list for this problem includes:    Antivert 25 Mg Tabs (Meclizine hcl) .Marland Kitchen... 1 tab by mouth tid as needed dizziness  Orders: [facility]- Est  Level 4 (99214) 24 Hr Holter (24 Hr Holter) ETT (ETT) Physical Therapy Referral (PT)  Problem # 2:  HYPERGLYCEMIA (ICD-790.29) Assessment: New  Noted on ED.  Check A1c today.  Orders: [facility]- Est  Level 4 (99214) A1C-[facility] (96045)  Complete Medication List: 1)  Albuterol 90 Mcg/act Aers (Albuterol) .... 2 puffs q 4 hours as needed 2)  Methimazole 5 Mg Tabs (Methimazole) .Marland Kitchen.. 1 qd 3)  Antivert 25 Mg Tabs (Meclizine hcl) .Marland Kitchen.. 1 tab by mouth tid as needed dizziness  Patient Instructions: 1)  Pleasure to meet you today. 2)  I believe this is vertigo--continue to  take the Antivert as needed.  I have scheduled you for physical therapy which should help this. 3)  I also want you to wear a heart monitor for 24 hours and to have a stress test to evaluate your heart--we will arrange this.  Appended Document: A1c  5.1%    Lab Visit  Laboratory Results   Blood Tests   Date/Time Received: Sep 28, 2009 3:50 PM  Date/Time Reported: Sep 28, 2009 4:00 PM   HGBA1C: 5.1%   (Normal Range: Non-Diabetic - 3-6%   Control Diabetic - 6-8%)  Comments: ...........test performed by..........Marland Kitchen Ashlee  Dais, SMA .............entered by...........Marland KitchenBonnie A. [place], MLS (ASCP)cm    Orders Today:

## 2010-06-28 NOTE — Procedures (Signed)
Summary: Holter and Event  Holter and Event   Imported By: De Nurse 10/19/2009 16:06:41  _____________________________________________________________________  External Attachment:    Type:   Image     Comment:   External Document

## 2010-06-28 NOTE — Progress Notes (Signed)
Summary: phn msg  Phone Note Call from Patient Call back at [phone]   Caller: Patient Summary of Call: wants to know about her stress test and results of holter moniter Initial call taken by: De Nurse,  Oct 05, 2009 11:39 AM  Follow-up for Phone Call        called EKG and they will fax over Holter monitor results now. patient advised that  we  will call her back with report. Follow-up by: Theresia Lo RN,  Oct 05, 2009 4:39 PM  Additional Follow-up for Phone Call Additional follow up Details #1::        report received and will give to Dr. Lafonda Mosses to please advise.  the appointment for ETT is currently being worked on and will notify patient as soon as it has been scheduled. Additional Follow-up by: Theresia Lo RN,  Oct 06, 2009 9:55 AM    Additional Follow-up for Phone Call Additional follow up Details #2::     Dr. Lafonda Mosses reviewed  Holter  monitor results and states she feels it is ok  , but will give to Dr. Constance Goltz upon his return  next for further review.  patient notified. Follow-up by: Theresia Lo RN,  Oct 06, 2009 12:18 PM
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
## 2010-06-28 NOTE — Miscellaneous (Signed)
Summary: Re: appointment for EKG  Clinical Lists Changes patient has cancelled two appointment where she was scheduled for the EKG. called and left message this AM for her to call and reschedule . Theresia Lo RN  March 29, 2010 10:25 AM
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
## 2010-06-28 NOTE — Miscellaneous (Signed)
   Clinical Lists Changes  Problems: Changed problem from ASTHMA, UNSPECIFIED (ICD-493.90) to ASTHMA, INTERMITTENT (ICD-493.90) 

## 2010-06-30 NOTE — Assessment & Plan Note (Signed)
Summary: f/u appt/#/cd   Vital Signs:  Patient profile:   35 year old female Height:      71 inches (180.34 cm) Weight:      238.25 pounds (108.30 kg) BMI:     33.35 O2 Sat:      97 % on Room air Temp:     97.6 degrees F (36.44 degrees C) oral Pulse rate:   76 / minute BP sitting:   112 / 78  (left arm) Cuff size:   large  Vitals Entered By: Brenton Grills CMA Duncan Dull) (May 09, 2010 10:54 AM)  O2 Flow:  Room air CC: Follow-up on thyroid/refill on Methimazole/aj Is Patient Diabetic? No Comments PT is no longer taking Antivert or Naprosyn   Primary Provider:  pratt  CC:  Follow-up on thyroid/refill on Methimazole/aj.  History of Present Illness: pt returns for f/u of hyperthyroidism.  she takes tapazole as rx'ed, except she ran out 4 days ago.   Current Medications (verified): 1)  Albuterol 90 Mcg/act  Aers (Albuterol) .... 2 Puffs Q 4 Hours As Needed 2)  Methimazole 5 Mg Tabs (Methimazole) .Marland Kitchen.. 1 Qd 3)  Albuterol .... 2 Puffs Q 4 Hours As Needed 4)  Methimazole 5 Mg Tabs (Methimazole) .Marland Kitchen.. 1 Daily 5)  Antivert 25 Mg Tabs (Meclizine Hcl) .Marland Kitchen.. 1 Tab By Mouth Tid As Needed Dizziness 6)  Cyclobenzaprine Hcl 10 Mg Tabs (Cyclobenzaprine Hcl) .... Take 1 Tablet At Night, But May Take 1 During The Day For Muscle Tightness 7)  Naprosyn 500 Mg Tabs (Naproxen) .Marland Kitchen.. 1 By Mouth Two Times A Day 8)  Klonopin 0.5 Mg Tabs (Clonazepam) .Marland Kitchen.. 1-2 By Mouth Three Times A Day As Needed Anxiety  Allergies (verified): 1)  ! Penicillin 2)  ! Penicillin  Past History:  Past Medical History: Last updated: 07/20/2009 HYPERTHYROIDISM (ICD-242.90)  Past Surgical History: mirena-removed 10/07 - 05/10/2004 Tubal ligation 2008  Review of Systems  The patient denies fever.    Physical Exam  General:  normal appearance.   Neck:  thyroid is 3x normal size, no nodule Skin:  not diaphoretic Additional Exam:  FastTSH                   1.35 uIU/mL                 0.35-5.50 Free T4                    0.76 ng/dL                  0.60-1.60    Impression & Recommendations:  Problem # 1:  HYPERTHYROIDISM (ICD-242.90) well-controlled  Other Orders: TLB-TSH (Thyroid Stimulating Hormone) (84443-TSH) TLB-T4 (Thyrox), Free [phone]) Est. Patient Level III (44010)  Patient Instructions: 1)  blood tests are being ordered for you today.  please call [phone] to hear your test results. 2)  if ever you have fever while taking this medication, stop it and call us, because of the risk of a rare side-effect 3)  return 4 months.   Orders Added: 1)  TLB-TSH (Thyroid Stimulating Hormone) [84443-TSH] 2)  TLB-T4 (Thyrox), Free [44034-VQ2V] 3)  Est. Patient Level III [95638]

## 2010-06-30 NOTE — Progress Notes (Signed)
Summary: Rx req  Phone Note Call from Patient Call back at Home Phone [phone]   Caller: Patient Summary of Call: Pt called stating Rx that SAE was to send to pharmacy is not there. Pt is requesting Rx to [facility] [address] Initial call taken by: Margaret Pyle, CMA,  May 12, 2010 2:29 PM  Follow-up for Phone Call        sent Follow-up by: Minus Breeding MD,  May 12, 2010 3:19 PM  Additional Follow-up for Phone Call Additional follow up Details #1::        Pt informed via VM Additional Follow-up by: Margaret Pyle, CMA,  May 12, 2010 3:25 PM    Prescriptions: METHIMAZOLE 5 MG TABS (METHIMAZOLE) 1 qd  #30 x 5   Entered and Authorized by:   Minus Breeding MD   Signed by:   Minus Breeding MD on 05/12/2010   Method used:   Electronically to        [facility] [address] [address] [phone]* (retail)       [address]       [place], [place]  [address]       Ph: [phone]       Fax: [phone]   RxID:   [id number]

## 2010-06-30 NOTE — Assessment & Plan Note (Signed)
Summary: [facility]   Vital Signs:  Patient profile:   35 year old female Height:      71 inches Weight:      246.50 pounds BMI:     34.50 BSA:     2.31 Temp:     98.1 degrees F Pulse rate:   74 / minute BP sitting:   118 / 80  Vitals Entered By: Jone Baseman CMA (June 23, 2010 11:17 AM) CC: f/u Is Patient Diabetic? No Pain Assessment Patient in pain? no        Primary Care Provider:  Tinnie Gens MD  CC:  f/u.  History of Present Illness: Pt. returns today.  she is not doing well since the death of her mother.  She continues to have problems with sadness.  She is not seeing anyone.  She is in school, has quit working, does have a house.  She is not suicidal but continues to shut down her emotions and not deal with them.  She has continued issues with chest pain that is assoc. with left arm numbness.  Chest pain is not with exertion, but with emotional stress.  She is familiar with panic attacks and does not feel like she is having these.  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 2 Puffs Q 4 Hours As Needed 2)  Methimazole 5 Mg Tabs (Methimazole) .Marland Kitchen.. 1 Qd 3)  Cyclobenzaprine Hcl 10 Mg Tabs (Cyclobenzaprine Hcl) .... Take 1 Tablet At Night, But  May Take 1 During The Day For Muscle Tightness 4)  Magnesium Citrate 1.745 Gm/39ml Soln (Magnesium Citrate) .Marland Kitchen.. 1 Bottle Daily As Needed For Constipation 5)  Fleet Enema 7-19 Gm/147ml Enem (Sodium Phosphates)  Allergies (verified): 1)  ! Penicillin  Past History:  Past Surgical History: Last updated: 05/09/2010 mirena-removed 10/07 - 05/10/2004 Tubal ligation 2008  Family History: Last updated: 07/26/2006 hypertension  Social History: Last updated: 01/31/2008 No tobacco, Etoh, drug use Single  Risk Factors: Smoking Status: quit > 6 months (06/23/2010) Packs/Day: 3-4 cigs a day (06/23/2010)  Review of Systems       The patient complains of weight gain and chest pain.  The patient denies anorexia, dyspnea on exertion, peripheral edema, prolonged cough, headaches, abdominal pain, and severe indigestion/heartburn.    Physical Exam  General:  alert, well-developed, and well-nourished.   Head:  normocephalic and atraumatic.   Heart:  normal rate and regular rhythm.   Abdomen:  soft and non-tender.   Psych:  Oriented X3, memory intact for recent and remote, dysphoric affect, depressed affect, withdrawn, tearful, and slightly anxious.     Impression & Recommendations:  Problem # 1:  HYPERGLYCEMIA (ICD-790.29)  Problem # 2:  HYPERTHYROIDISM (ICD-242.90)  The  following medications were removed from the medication list:    Methimazole 5 Mg Tabs (Methimazole) .Marland Kitchen... 1 daily Her updated medication list for this problem includes:    Methimazole 5 Mg Tabs (Methimazole) .Marland Kitchen... 1 qd  Orders: Free T3-[facility] [phone]) Free T4-[facility] [phone]) TSH-[facility] [phone]) [facility]- Est Level  3 (23557)  Problem # 3:  OBESITY (ICD-278.00)  Orders: [facility]- Est Level  3 (32202)  Complete Medication List: 1)  Albuterol 90 Mcg/act Aers (Albuterol) .... 2 puffs q 4 hours as needed 2)  Methimazole 5 Mg Tabs (Methimazole) .Marland Kitchen.. 1 qd 3)  Cyclobenzaprine Hcl 10 Mg Tabs (Cyclobenzaprine hcl) ....  Take 1 tablet at night, but may take 1 during the day for muscle tightness 4)  Magnesium Citrate 1.745 Gm/17ml Soln (Magnesium citrate) .Marland Kitchen.. 1 bottle daily as needed for constipation 5)  Fleet Enema 7-19 Gm/17ml Enem (Sodium phosphates)  Other Orders: Basic Met-[facility] ([id number]) CBC-[facility] (28315) 12 Lead EKG (12 Lead EKG)  Patient Instructions: 1)  Please schedule a follow-up appointment in 1 month.  Prescriptions: ALBUTEROL 90 MCG/ACT  AERS (ALBUTEROL) 2 puffs q 4 hours as needed  #1 mdi x 11   Entered and Authorized by:   Tinnie Gens MD   Signed by:   Tinnie Gens MD on 06/23/2010   Method used:   Electronically to        [facility] [phone]* (retail)       [address]       [place], [place]  [address]       Ph: [phone]       Fax: [phone]   RxID:   [id number]    Orders Added: 1)  Basic Met-[facility] [10175-10258] 2)  Free T3-[facility] [[id number]] 3)  Free T4-[facility] [36144-31540] 4)  TSH-[facility] [08676-19509] 5)  CBC-[facility] [85027] 6)  12 Lead EKG [12 Lead EKG] 7)  [facility]- Est Level  3 [32671]

## 2010-06-30 NOTE — Assessment & Plan Note (Signed)
Summary: abd pain,df   Vital Signs:  Patient profile:   35 year old female Height:      71 inches Weight:      246.25 pounds BMI:     34.47 BSA:     2.31 Temp:     97.7 degrees F Pulse rate:   67 / minute BP sitting:   117 / 73  Vitals Entered By: Jone Baseman CMA (June 22, 2010 10:39 AM) CC: abdominal pain Is Patient Diabetic? No Pain Assessment Patient in pain? yes     Location: abdomen Intensity: 8   Primary Care Provider:  pratt  CC:  abdominal pain.  History of Present Illness: 1. Abdominal pain:  Pt has had abdominal pain for the past couple of weeks.  She has a hx of constipation and thinks that it is related to this.  She has had adequate BMs for the past 3 weeks.  She has been seen by GI who diagnosed her with chronic constipation.  She has been taking Miralax daily.  Her last BM was a couple of days ago.  This is associated with nausea.  She doesn't drink much water (maybe 2 bottles / day) and only eats junk food.  To have a BM she has to usually take Mag Citrate.  She hasn't taken any of that recently.  She has tried enemas before but she doesn't think that they help.  ROS: denies emesis, rectal bleeding, fevers  Habits & Providers  Alcohol-Tobacco-Diet     Tobacco Status: quit > 6 months     Tobacco Counseling: to remain off tobacco products     Cigarette Packs/Day: 3-4 cigs a day     Year Started: 3-4 cigs a day     Year Quit: 2006  Current Medications (verified): 1)  Albuterol 90 Mcg/act  Aers (Albuterol) .... 2 Puffs Q 4 Hours As Needed 2)  Methimazole 5 Mg Tabs (Methimazole) .Marland Kitchen.. 1 Qd 3)  Albuterol .... 2 Puffs Q 4 Hours As Needed 4)  Methimazole 5 Mg Tabs (Methimazole) .Marland Kitchen.. 1 Daily 5)  Antivert 25 Mg Tabs (Meclizine Hcl) .Marland Kitchen.. 1 Tab By Mouth Tid As Needed Dizziness 6)  Cyclobenzaprine Hcl 10 Mg Tabs (Cyclobenzaprine Hcl) .... Take 1 Tablet At Night, But May Take 1 During The Day For Muscle Tightness 7)  Naprosyn 500 Mg Tabs (Naproxen) .Marland Kitchen..  1 By Mouth Two Times A Day 8)  Klonopin 0.5 Mg Tabs (Clonazepam) .Marland Kitchen.. 1-2 By Mouth Three Times A Day As Needed Anxiety 9)  Magnesium Citrate 1.745 Gm/75ml Soln (Magnesium Citrate) .Marland Kitchen.. 1 Bottle Daily As Needed For Constipation 10)  Fleet Enema 7-19 Gm/181ml Enem (Sodium Phosphates)  Allergies: 1)  ! Penicillin 2)  ! Penicillin  Past History:  Past Medical History: Reviewed history from 07/20/2009 and no changes required. HYPERTHYROIDISM (ICD-242.90)  Social History: Reviewed history from 01/31/2008 and no changes required. No tobacco, Etoh, drug use Single  Physical Exam  General:  vitals reviewed.  sitting comfortably. no acute distress.   Lungs:  normal respiratory effort.   Heart:  normal rate.   Abdomen:  soft, non-tender, normal bowel sounds, no distention, no masses, no guarding, no rigidity, and no rebound tenderness.  Nauseas after exam   Impression & Recommendations:  Problem # 1:  CONSTIPATION (ICD-564.0) Assessment Deteriorated Chronic constipation.  No significant BM in the past 3 weeks.  Will try and clean her out as an outpatient.  Will try MagCitrate 1 bottle daily and Fleets Enema (soaps suds)  daily. F/U tomorrow to recheck. Her updated medication list for this problem includes:    Magnesium Citrate 1.745 Gm/41ml Soln (Magnesium citrate) .Marland Kitchen... 1 bottle daily as needed for constipation    Fleet Enema 7-19 Gm/131ml Enem (Sodium phosphates)  Complete Medication List: 1)  Albuterol 90 Mcg/act Aers (Albuterol) .... 2 puffs q 4 hours as needed 2)  Methimazole 5 Mg Tabs (Methimazole) .Marland Kitchen.. 1 qd 3)  Albuterol  .... 2 puffs q 4 hours as needed 4)  Methimazole 5 Mg Tabs (Methimazole) .Marland Kitchen.. 1 daily 5)  Antivert 25 Mg Tabs (Meclizine hcl) .Marland Kitchen.. 1 tab by mouth tid as needed dizziness 6)  Cyclobenzaprine Hcl 10 Mg Tabs (Cyclobenzaprine hcl) .... Take 1 tablet at night, but may take 1 during the day for muscle tightness 7)  Naprosyn 500 Mg Tabs (Naproxen) .Marland Kitchen.. 1 by  mouth two times a day 8)  Klonopin 0.5 Mg Tabs (Clonazepam) .Marland Kitchen.. 1-2 by mouth three times a day as needed anxiety 9)  Magnesium Citrate 1.745 Gm/25ml Soln (Magnesium citrate) .Marland Kitchen.. 1 bottle daily as needed for constipation 10)  Fleet Enema 7-19 Gm/185ml Enem (Sodium phosphates)  Other Orders: [facility]- Est Level  3 (45409)  Patient Instructions: 1)  Drink 1 bottle a day of the Magnesium Citrate a day until you are having 2-3 BM's a day 2)  Also start using over the counter, soaps suds enema daily until adequate BMs. 3)  Please schedule an appointment tomorrow with me in nutrition clinic to check on how you are doing and to start talking about some nutrition changes   Orders Added: 1)  [facility]- Est Level  3 [81191]

## 2010-06-30 NOTE — Letter (Signed)
Summary: Results Follow-up Letter  All     ,     Phone:   Fax:     06/24/2010  Aldine Contes. [address] [place], [place]  [address]  Dear Ms. HANNIBAL,   The following are the results of your recent test(s):  Your chemistries, electrolytes, blood counts, kidney and thyroid function all appear normal.  Your EKG was also normal and not suggestive of heart disease.  Sincerely,    Tinnie Gens MD

## 2010-06-30 NOTE — Assessment & Plan Note (Signed)
Summary: NUTRI APPT/KH   Vital Signs:  Patient profile:   35 year old female Height:      71 inches Weight:      248 pounds  Vitals Entered By: Wyona Almas PHD (June 23, 2010 1:33 PM)  Primary Care Provider:  pratt   History of Present Illness: 1. Constipation:  Not much better.  Has not gotten the Mag Citrate yet.  Small BM this morning.  Abdominal discomfort unchanged.    ROS: endorses mild nausea  3. Hyperthroidism:  She is tired of taking the thyroid medication.  She feels terrible when taking it.  She has been off of it for the past 2 weeks and feels a little bit better.  Since starting the medicine she has gained weight, had more constipation, increased fatigue, and has been depressed  2. Obesity:  Pt comes in today for diet and nutrition advice.  Her concerns are her weight, constipation, and her increased risk of cancer (mom and aunt deceased from breast cancer)  24 Hour diet history: -Breakfast: [employer] surgeon -Snack: nothing -Lunch: nothing -Snack: nothing -Dinner: Chicken or Fish / 2 vegetables / starch  Water intake: 2 bottles of water daily  Sleep: only getting 4 hours of sleep a night  Current Medications (verified): 1)  Albuterol 90 Mcg/act  Aers (Albuterol) .... 2 Puffs Q 4 Hours As Needed 2)  Methimazole 5 Mg Tabs (Methimazole) .Marland Kitchen.. 1 Qd 3)  Albuterol .... 2 Puffs Q 4 Hours As Needed 4)  Methimazole 5 Mg Tabs (Methimazole) .Marland Kitchen.. 1 Daily 5)  Antivert 25 Mg Tabs (Meclizine Hcl) .Marland Kitchen.. 1 Tab By Mouth Tid As Needed Dizziness 6)  Cyclobenzaprine Hcl 10 Mg Tabs (Cyclobenzaprine Hcl) .... Take 1 Tablet At Night, But May Take 1 During The Day For Muscle Tightness 7)  Naprosyn 500 Mg Tabs (Naproxen) .Marland Kitchen.. 1 By Mouth Two Times A Day 8)  Klonopin 0.5 Mg Tabs (Clonazepam) .Marland Kitchen.. 1-2 By Mouth Three Times A Day As Needed Anxiety 9)  Magnesium Citrate 1.745 Gm/79ml Soln (Magnesium Citrate) .Marland Kitchen.. 1 Bottle Daily As Needed For Constipation 10)  Fleet Enema 7-19 Gm/168ml  Enem (Sodium Phosphates)  Allergies: 1)  ! Penicillin 2)  ! Penicillin  Past History:  Past Medical History: Reviewed history from 07/20/2009 and no changes required. HYPERTHYROIDISM (ICD-242.90)  Social History: Reviewed history from 01/31/2008 and no changes required. No tobacco, Etoh, drug use Single  Physical Exam  General:  vitals reviewed.  sitting comfortably. no acute distress.   Eyes:  Thinning eyebrows.  No exopthalmos Neck:  supple.   Lungs:  normal respiratory effort.   Heart:  normal rate.   Abdomen:  soft, non-tender, normal bowel sounds, no distention, no masses, no guarding, no rigidity, and no rebound tenderness.   Psych:  normally interactive, good eye contact, not anxious appearing, and not depressed appearing.     Impression & Recommendations:  Problem # 1:  CONSTIPATION, CHRONIC (ICD-564.09) Assessment Unchanged  Persists.  Advised her to start with the regimen recommended yesterday Her updated medication list for this problem includes:    Magnesium Citrate 1.745 Gm/23ml Soln (Magnesium citrate) .Marland Kitchen... 1 bottle daily as needed for constipation    Fleet Enema 7-19 Gm/140ml Enem (Sodium phosphates)  Orders: [facility]- Est  Level 4 (88416)  Problem # 2:  HYPERTHYROIDISM (ICD-242.90) Assessment: Unchanged  She is tired of taking the Methimazole.  She feels like it is making her feel miserable.  Discussed the risks and benefits of the other alternative treatments  for hyperthyroidism.  She will discuss with PCP and endocrinologist Her updated medication list for this problem includes:    Methimazole 5 Mg Tabs (Methimazole) .Marland Kitchen... 1 qd    Methimazole 5 Mg Tabs (Methimazole) .Marland Kitchen... 1 daily  Orders: [facility]- Est  Level 4 (16109)  Problem # 3:  OBESITY (ICD-278.00) Assessment: Unchanged  Went over dietary history.  Her main problems that are making it difficult for her to lose weight are: 1. Only eating one meal a day 2. Snacking on starchy carbs throughout the  day 3. Not enough sleep 4. Not enough water 5. Not enough fiber 6. Not enough exercise Recommended the dietary and lifestyle changes to address those deficiciencies.  Emphasized the importance of: 1. Frequent small meals throughout the day.  At least 3 regular meals and 2 snacks.  Meals must be balanced with equal amounts of good carbs (vegetables), lean proteins, and healthy fats 2. Drink at least 8 glasses of water daily 3. Reviewed exercise that will help with weight loss including interval training 4. Recommended at least 7-8 hours of sleep  Orders: [facility]- Est  Level 4 (99214)  Complete Medication List: 1)  Albuterol 90 Mcg/act Aers (Albuterol) .... 2 puffs q 4 hours as needed 2)  Methimazole 5 Mg Tabs (Methimazole) .Marland Kitchen.. 1 qd 3)  Albuterol  .... 2 puffs q 4 hours as needed 4)  Methimazole 5 Mg Tabs (Methimazole) .Marland Kitchen.. 1 daily 5)  Antivert 25 Mg Tabs (Meclizine hcl) .Marland Kitchen.. 1 tab by mouth tid as needed dizziness 6)  Cyclobenzaprine Hcl 10 Mg Tabs (Cyclobenzaprine hcl) .... Take 1 tablet at night, but may take 1 during the day for muscle tightness 7)  Naprosyn 500 Mg Tabs (Naproxen) .Marland Kitchen.. 1 by mouth two times a day 8)  Klonopin 0.5 Mg Tabs (Clonazepam) .Marland Kitchen.. 1-2 by mouth three times a day as needed anxiety 9)  Magnesium Citrate 1.745 Gm/49ml Soln (Magnesium citrate) .Marland Kitchen.. 1 bottle daily as needed for constipation 10)  Fleet Enema 7-19 Gm/158ml Enem (Sodium phosphates)  Patient Instructions: 1)  Please schedule a follow up appointment with me in 1 month to discuss your diet and lifestyle changes   Orders Added: 1)  [facility]- Est  Level 4 [60454]

## 2010-08-03 ENCOUNTER — Ambulatory Visit (INDEPENDENT_AMBULATORY_CARE_PROVIDER_SITE_OTHER): Payer: Medicaid Other | Admitting: Family Medicine

## 2010-08-03 ENCOUNTER — Encounter: Payer: Self-pay | Admitting: Family Medicine

## 2010-08-03 VITALS — BP 123/78 | HR 105 | Ht 71.0 in | Wt 253.0 lb

## 2010-08-03 DIAGNOSIS — E0590 Thyrotoxicosis, unspecified without thyrotoxic crisis or storm: Secondary | ICD-10-CM

## 2010-08-03 DIAGNOSIS — F411 Generalized anxiety disorder: Secondary | ICD-10-CM

## 2010-08-03 DIAGNOSIS — J45909 Unspecified asthma, uncomplicated: Secondary | ICD-10-CM

## 2010-08-03 DIAGNOSIS — E669 Obesity, unspecified: Secondary | ICD-10-CM

## 2010-08-03 MED ORDER — AMITRIPTYLINE HCL 100 MG PO TABS: 100.0000 mg | ORAL_TABLET | Freq: Every day | ORAL | Status: DC

## 2010-08-03 MED ORDER — ALBUTEROL 90 MCG/ACT IN AERS: 2.0000 | INHALATION_SPRAY | RESPIRATORY_TRACT | Status: DC | PRN

## 2010-08-03 NOTE — Assessment & Plan Note (Signed)
Not taking meds as directed---c/o weight gain. Not interested in RAI or surgical treatment.

## 2010-08-03 NOTE — Progress Notes (Signed)
  Subjective:    Patient ID: Monica Chase, female    DOB: Sep 26, 1975, 35 y.o.   MRN: [id number]  Anxiety Presents for initial visit. Onset was 1 to 6 months ago. The problem has been gradually worsening. Symptoms include chest pain, depressed mood, dizziness, excessive worry, insomnia, nervous/anxious behavior and panic. Patient reports no confusion, dry mouth, muscle tension, nausea, shortness of breath or suicidal ideas. Symptoms occur most days. The severity of symptoms is moderate. The symptoms are aggravated by family issues. The quality of sleep is poor. Nighttime awakenings: several.   Risk factors include a major life event. Her past medical history is significant for asthma and hyperthyroidism. Past treatments include benzodiazephines. The treatment provided no relief. Compliance with prior treatments has been poor. Prior compliance problems include medication issues.      Review of Systems  Constitutional: Negative for fever, chills and fatigue.  Respiratory: Negative for shortness of breath.   Cardiovascular: Positive for chest pain.  Gastrointestinal: Negative for nausea.  Neurological: Positive for dizziness.  Psychiatric/Behavioral: Positive for dysphoric mood. Negative for suicidal ideas and confusion. The patient is nervous/anxious and has insomnia.        Objective:   Physical Exam  Constitutional: She appears well-developed and well-nourished.  HENT:  Head: Normocephalic and atraumatic.  Eyes: No scleral icterus.  Cardiovascular: Normal rate.   Pulmonary/Chest: Effort normal and breath sounds normal.  Abdominal: Soft. Bowel sounds are normal. There is no tenderness.  Psychiatric: Judgment normal. Her speech is not rapid and/or pressured. She is not slowed and not withdrawn. Thought content is not delusional. Cognition and memory are normal. She does not exhibit a depressed mood. She expresses no homicidal and no suicidal ideation.          Assessment & Plan:

## 2010-08-03 NOTE — Assessment & Plan Note (Signed)
Will not accept LABA or ICS.

## 2010-08-03 NOTE — Assessment & Plan Note (Addendum)
Will refer to [facility] for therapy. Needs something for sleep

## 2010-08-03 NOTE — Patient Instructions (Signed)
Obesity Obesity is defined as having a Body Mass Index (BMI) of 30 or more. To calculate your BMI divide your weight in pounds by your height in inches squared and multiply that product by 703. Major illnesses resulting from long-term obesity include:  Stroke.   Heart disease.   Diabetes.   Many cancers.   Arthritis.  Obesity also complicates recovery from many other medical problems.  CAUSES  A history of obesity in your parents.   Thyroid hormone imbalance.   Environmental factors such as excess calorie intake and physical inactivity.  TREATMENT A healthy weight loss program includes:  A calorie restricted diet based on individual calorie needs.   Increased physical activity (exercise).  An exercise program is just as important as the right low calorie diet.  Weight-loss medicines should be used only under the supervision of your physician. These medicines help, but only if they are used with diet and exercise programs. Medicines can have side effects including nervousness, nausea, abdominal pain, diarrhea, headache, drowsiness, and depression.  An unhealthy weight loss program includes:  Fasting.   Fad diets.   Supplements and drugs.  These choices do not succeed in long-term weight control.  HOME CARE INSTRUCTIONS To help you make the needed dietary changes:   Keep a daily record of everything you eat. There are many free websites to help you with this. It may be helpful to measure your foods so you can determine if you are eating the correct portion sizes.   Use low-calorie cookbooks or take special cooking classes.   Avoid alcohol. Drink more water and drinks with no calories.   Take vitamins and supplements only as recommended by your caregiver.   Weight-loss support groups, Optometrist, counselors, and stress reduction education can also be very helpful.  PREVENTION Losing weight and keeping it off takes time, discipline, a healthy diet and regular  exercise. Document Released: 06/22/2004 Document Re-Released: 06/04/2007 [facility] Patient Information 2011 [place], [place].Anxiety and Panic Attacks Your caregiver has informed you that you are having an anxiety or panic attack. There may be many forms of this. Most of the time these attacks come suddenly and without warning. They come at any time of day, including periods of sleep, and at any time of life. They may be strong and unexplained. Although panic attacks are very scary, they are physically harmless. Sometimes the cause of your anxiety is not known. Anxiety is a protective mechanism of the body in its fight or flight mechanism. Most of these perceived danger situations are actually nonphysical situations (such as anxiety over losing a job). CAUSES The causes of an anxiety or panic attack are many. Panic attacks may occur in otherwise healthy people given a certain set of circumstances. There may be a genetic cause for panic attacks. Some medications may also have anxiety as a side effect. SYMPTOMS Some of the most common feelings are:  Intense terror.  Dizziness, feeling faint.   Hot and cold flashes.   Fear of going crazy.   Feelings that nothing is real.   Sweating.   Shaking.   Chest pain or a fast heartbeat (palpitations).  Smothering, choking sensations.   Feelings of impending doom and that death is near.   Tingling of extremities, this may be from over breathing.   Altered reality (derealization).   Being detached from yourself (depersonalization).   Several symptoms can be present to make up anxiety or panic attacks. DIAGNOSIS The evaluation by your caregiver will depend on the  type of symptoms you are experiencing. The diagnosis of anxiety or pain attack is made when no physical illness can be determined to be a cause of the symptoms. TREATMENT Treatment to prevent anxiety and panic attacks may include:  Avoidance of circumstances that cause anxiety.    Reassurance and relaxation.   Regular exercise.   Relaxation therapies, such as yoga.   Psychotherapy with a psychiatrist or therapist.   Avoidance of caffeine, alcohol and illegal drugs.   Prescribed medication.  SEEK IMMEDIATE MEDICAL CARE IF:  You experience panic attack symptoms that are different than your usual symptoms.   You have any worsening or concerning symptoms.  Document Released: 05/15/2005 Document Re-Released: 11/02/2009 [facility] Patient Information 2011 [place], [place].Depression, Adolescent and Adult Depression is a true and treatable medical condition. In general there are two kinds of depression:  Depression we all experience in some form. For example depression from the death of a loved one, financial distress or natural disasters will trigger or increase depression.   Clinical depression, on the other hand, appears without an apparent cause or reason. This depression is a disease. Depression may be caused by chemical imbalance in the body and brain or may come as a response to a physical illness. Alcohol and other drugs can cause depression.  DIAGNOSIS  The diagnosis of depression is usually based upon symptoms and medical history. TREATMENT Treatments for depression fall into three categories. These are:  Drug therapy. There are many medicines that treat depression. Responses may vary and sometimes trial and error is necessary to determine the best medicines and dosage for a particular patient.   Psychotherapy, also called talking treatments, helps people resolve their problems by looking at them from a different point of view and by giving people insight into their own personal makeup. Traditional psychotherapy looks at a childhood source of a problem. Other psychotherapy will look at current conflicts and move toward solving those. If the cause of depression is drug use, counseling is available to help abstain. In time the depression will usually improve.  If there were underlying causes for the chemical use, they can be addressed.   ECT (electroconvulsive therapy) or shock treatment is not as commonly used today. It is a very effective treatment for severe suicidal depression. During ECT electrical impulses are applied to the head. These impulses cause a generalized seizure. It can be effective but causes a loss of memory for recent events. Sometimes this loss of memory may include the last several months.  Treat all depression or suicide threats as serious. Obtain professional help. Do not wait to see if serious depression will get better over time without help. Seek help for yourself or those around you. In the [place] the number to the National Suicide Help Lines With 24 Hour Help Are: 1-800-SUICIDE (228) 675-0586 Document Released: 05/12/2000 Document Re-Released: 08/11/2008 [facility] Patient Information 2011 [place], [place].

## 2010-08-03 NOTE — Assessment & Plan Note (Addendum)
Is seeing nutritioinist.  Advised on types of exercise that may be beneficial.  Requests meds but none are given and explanation for why not also given.

## 2010-08-16 LAB — URINALYSIS, ROUTINE W REFLEX MICROSCOPIC
Bilirubin Urine: NEGATIVE
Hgb urine dipstick: NEGATIVE
Ketones, ur: NEGATIVE mg/dL
Protein, ur: NEGATIVE mg/dL

## 2010-08-16 LAB — CBC
HCT: 40.4 % (ref 36.0–46.0)
Hemoglobin: 14.1 g/dL (ref 12.0–15.0)
MCHC: 34.9 g/dL (ref 30.0–36.0)
MCV: 91.4 fL (ref 78.0–100.0)
Platelets: 312 10*3/uL (ref 150–400)
RDW: 13.3 % (ref 11.5–15.5)
WBC: 8.1 10*3/uL (ref 4.0–10.5)

## 2010-08-16 LAB — DIFFERENTIAL
Basophils Absolute: 0 10*3/uL (ref 0.0–0.1)
Eosinophils Absolute: 0.4 10*3/uL (ref 0.0–0.7)
Lymphs Abs: 2.4 10*3/uL (ref 0.7–4.0)
Monocytes Absolute: 0.4 10*3/uL (ref 0.1–1.0)
Monocytes Relative: 4 % (ref 3–12)
Neutro Abs: 4.9 10*3/uL (ref 1.7–7.7)

## 2010-08-16 LAB — COMPREHENSIVE METABOLIC PANEL
AST: 17 U/L (ref 0–37)
Albumin: 3.8 g/dL (ref 3.5–5.2)
BUN: 5 mg/dL — ABNORMAL LOW (ref 6–23)
CO2: 27 mEq/L (ref 19–32)
Calcium: 8.7 mg/dL (ref 8.4–10.5)
Chloride: 104 mEq/L (ref 96–112)
GFR calc Af Amer: >60 mL/min (ref >60)
Glucose, Bld: 110 mg/dL — ABNORMAL HIGH (ref 70–99)
Sodium: 136 mEq/L (ref 135–145)
Total Protein: 7 g/dL (ref 6.0–8.3)

## 2010-08-16 LAB — POCT PREGNANCY, URINE: Preg Test, Ur: NEGATIVE

## 2010-09-01 LAB — POCT I-STAT, CHEM 8
BUN: 7 mg/dL (ref 6–23)
Calcium, Ion: 1.12 mmol/L (ref 1.12–1.32)
HCT: 40 % (ref 36.0–46.0)
Hemoglobin: 13.6 g/dL (ref 12.0–15.0)
Potassium: 3.7 mEq/L (ref 3.5–5.1)

## 2010-09-01 LAB — URINALYSIS, ROUTINE W REFLEX MICROSCOPIC
Glucose, UA: NEGATIVE mg/dL
Protein, ur: NEGATIVE mg/dL
Specific Gravity, Urine: 1.029 (ref 1.005–1.030)
Urobilinogen, UA: 1 mg/dL (ref 0.0–1.0)

## 2010-09-01 LAB — GC/CHLAMYDIA PROBE AMP, GENITAL: GC Probe Amp, Genital: NEGATIVE

## 2010-09-12 LAB — CULTURE, ROUTINE-ABSCESS

## 2010-09-14 ENCOUNTER — Ambulatory Visit (INDEPENDENT_AMBULATORY_CARE_PROVIDER_SITE_OTHER): Payer: Medicaid Other | Admitting: Family Medicine

## 2010-09-14 ENCOUNTER — Encounter: Payer: Self-pay | Admitting: Family Medicine

## 2010-09-14 DIAGNOSIS — K5900 Constipation, unspecified: Secondary | ICD-10-CM

## 2010-09-14 DIAGNOSIS — E0590 Thyrotoxicosis, unspecified without thyrotoxic crisis or storm: Secondary | ICD-10-CM

## 2010-09-14 DIAGNOSIS — F339 Major depressive disorder, recurrent, unspecified: Secondary | ICD-10-CM

## 2010-09-14 DIAGNOSIS — J329 Chronic sinusitis, unspecified: Secondary | ICD-10-CM

## 2010-09-14 LAB — TSH: TSH: 1.124 u[IU]/mL (ref 0.350–4.500)

## 2010-09-14 LAB — T3, FREE: T3, Free: 2.9 pg/mL (ref 2.3–4.2)

## 2010-09-14 MED ORDER — BENZONATATE 100 MG PO CAPS: 100.0000 mg | ORAL_CAPSULE | Freq: Four times a day (QID) | ORAL | Status: DC | PRN

## 2010-09-14 MED ORDER — AZITHROMYCIN 250 MG PO TABS: ORAL_TABLET | ORAL | Status: AC

## 2010-09-14 NOTE — Assessment & Plan Note (Signed)
Desires referral to GI

## 2010-09-14 NOTE — Progress Notes (Signed)
  Subjective:    Patient ID: Monica Chase, female    DOB: 03/27/1976, 35 y.o.   MRN: [id number]  HPI Comments: Has had benadryl.  Pain behind eyes and up in face. Still with constipation.  MagCitrate and Epsom salts are not working. Having sweats again.  Sinusitis This is a recurrent problem. The current episode started 1 to 4 weeks ago. The problem has been gradually worsening since onset. There has been no fever. Associated symptoms include congestion, coughing and shortness of breath. Pertinent negatives include no sneezing. Past treatments include nothing. The treatment provided no relief.      Review of Systems  Constitutional: Positive for activity change and fatigue. Negative for fever.  HENT: Positive for congestion, rhinorrhea and postnasal drip. Negative for sneezing.   Respiratory: Positive for cough and shortness of breath.        Objective:   Physical Exam  Constitutional: She appears well-developed and well-nourished.  HENT:  Head: Normocephalic and atraumatic.  Nose: Mucosal edema present. Left sinus exhibits maxillary sinus tenderness and frontal sinus tenderness.  Mouth/Throat: Oropharynx is clear and moist. No posterior oropharyngeal edema or posterior oropharyngeal erythema.  Eyes: Pupils are equal, round, and reactive to light.  Neck: Thyromegaly present.  Cardiovascular: Normal rate and regular rhythm.   Pulmonary/Chest: Effort normal and breath sounds normal.  Skin: She is not diaphoretic.          Assessment & Plan:

## 2010-09-14 NOTE — Assessment & Plan Note (Signed)
Have re-referred to [facility].

## 2010-09-14 NOTE — Assessment & Plan Note (Signed)
Will repeat labs secondary to symptoms

## 2010-09-14 NOTE — Patient Instructions (Signed)
Constipation in Adults Constipation is having fewer than 2 bowel movements per week. Usually, the stools are hard. As we grow older, constipation is more common. If you try to fix constipation with laxatives, the problem may get worse. This is because laxatives taken over a long period of time make the colon muscles weaker. A low-fiber diet, not taking in enough fluids, and taking some medicines may make these problems worse. MEDICATIONS THAT MAY CAUSE CONSTIPATION  Water pills (diuretics).  Calcium channel blockers (used to control blood pressure and for the heart).   Certain pain medicines (narcotics).   Anticholinergics.  Anti-inflammatory agents.   Antacids that contain aluminum.   DISEASES THAT CONTRIBUTE TO CONSTIPATION  Diabetes.  Parkinson's disease.   Dementia.   Stroke.  Depression.   Illnesses that cause problems with salt and water metabolism.   HOME CARE INSTRUCTIONS  Constipation is usually best cared for without medicines. Increasing dietary fiber and eating more fruits and vegetables is the best way to manage constipation.   Slowly increase fiber intake to 25 to 38 grams per day. Whole grains, fruits, vegetables, and legumes are good sources of fiber. A dietitian can further help you incorporate high-fiber foods into your diet.   Drink enough water and fluids to keep your urine clear or pale yellow.   A fiber supplement may be added to your diet if you cannot get enough fiber from foods.   Increasing your activities also helps improve regularity.   Suppositories, as suggested by your caregiver, will also help. If you are using antacids, such as aluminum or calcium containing products, it will be helpful to switch to products containing magnesium if your caregiver says it is okay.   If you have been given a liquid injection (enema) today, this is only a temporary measure. It should not be relied on for treatment of longstanding (chronic) constipation.    Stronger measures, such as magnesium sulfate, should be avoided if possible. This may cause uncontrollable diarrhea. Using magnesium sulfate may not allow you time to make it to the bathroom.  SEEK IMMEDIATE MEDICAL CARE IF:  There is bright red blood in the stool.   The constipation stays for more than 4 days.   There is belly (abdominal) or rectal pain.   You do not seem to be getting better.   You have any questions or concerns.  MAKE SURE YOU:  Understand these instructions.   Will watch your condition.   Will get help right away if you are not doing well or get worse.  Document Released: 02/11/2004 Document Re-Released: 08/09/2009 [facility] Patient Information 2011 [place], [place].Sinusitis Sinuses are air pockets within the bones of your face. The growth of bacteria within a sinus leads to infection. Infection keeps the sinuses from draining. This infection is called sinusitis. SYMPTOMS There will be different areas of pain depending on which sinuses have become infected.  The maxillary sinuses often produce pain beneath the eyes.   Frontal sinusitis may cause pain in the middle of the forehead and above the eyes.  Other problems (symptoms) include:  Toothaches.   Colored, pus-like (purulent) drainage from the nose.   Any swelling, warmth, or tenderness over the sinus areas may be signs of infection.  TREATMENT Sinusitis is most often determined by an exam and you may have x-rays taken. If x-rays have been taken, make sure you obtain your results. Or find out how you are to obtain them. Your caregiver may give you medications (antibiotics). These are  medications that will help kill the infection. You may also be given a medication (decongestant) that helps to reduce sinus swelling.  HOME CARE INSTRUCTIONS  Only take over-the-counter or prescription medicines for pain, discomfort, or fever as directed by your caregiver.   Drink extra fluids. Fluids help thin the mucus  so your sinuses can drain more easily.   Applying either moist heat or ice packs to the sinus areas may help relieve discomfort.   Use saline nasal sprays to help moisten your sinuses. The sprays can be found at your local drugstore.  SEEK IMMEDIATE MEDICAL CARE IF YOU DEVELOP:  High fever that is still present after two days of antibiotic treatment.   Increasing pain, severe headaches, or toothache.   Nausea, vomiting, or drowsiness.   Unusual swelling around the face or trouble seeing.  MAKE SURE YOU:   Understand these instructions.   Will watch your condition.   Will get help right away if you are not doing well or get worse.  Document Released: 05/15/2005 Document Re-Released: 04/27/2008 [facility] Patient Information 2011 [place], [place].Calorie Counting Diet A calorie counting diet requires you to eat the number of calories that are right for you during a day. Calories are the measurement of how much energy you get from the food you eat. Eating the right amount of calories is important for staying at a healthy weight. If you eat too many calories your body will store them as fat and you may gain weight. If you eat too few calories you may lose weight. Counting the number of calories that you eat during a day will help you to know if you're eating the right amount. A Registered Dietitian can determine how many calories you need in a day. The amount of calories you need varies from person to person. If your goal is to lose weight you will need to eat fewer calories. Losing weight can benefit you if you are overweight or have health problems such as heart disease, high blood pressure or diabetes. If your goal is to gain weight, you will need to eat more calories. Gaining weight may be necessary if you have a certain health problem that causes your body to need more energy. TIPS Whether you are increasing or decreasing the number of calories you eat during a day, it may be hard to get used  to changing what you eat and drink. The following are tips to help you keep track of the number of calories you are eating.  Measuring foods at home with measuring cups will help you to know the actual amount of food and number of calories you are eating.   Restaurants serve food in all different portion sizes. It is common that restaurants will serve food in amounts worth 2 or more serving sizes. While eating out, it may be helpful to estimate how many servings of a food you are given. For example, a serving of cooked rice is 1/2 cup and that is the size of half of a fist. Knowing serving sizes will help you have a better idea of how much food you are eating at restaurants.   Ask for smaller portion sizes or child-size portions at restaurants.   Plan to eat half of a meal at a restaurant and take the rest home or share the other half with a friend   Read food labels for calorie content and serving size   Most packaged food has a Nutrition Facts Panel on its side or back.  Here you can find out how many servings are in a package, the size of a serving, and the number of calories each serving has.   The serving size and number of servings per container are listed right below the Nutrition Facts heading. Just below the serving information, the number of calories in each serving is listed.   For example, say that a package has three cookies inside. The Nutrition Facts panel says that one serving is one cookie. Below that, it says that there are three servings in the container. The calories section of the Nutrition Facts says there are 90 calories. That means that there are 90 calories in one cookie. If you eat one cookie you have eaten 90 calories. If you eat all three cookies, you have eaten three times that amount, or 270 calories.  The list below tells you how big or small some common portion sizes are.  1 ounce (oz).................4 stacked dice.   3 oz.............................Marland KitchenDeck of  cards.   1 teaspoon (tsp)..........Marland KitchenTip of little finger.   1 tablespoon (Tbsp).Marland KitchenMarland KitchenMarland KitchenTip of thumb.   2 Tbsp.........................Marland KitchenGolf ball.    Cup.........................Marland KitchenHalf of a fist.   1 Cup..........................Marland KitchenA fist.  KEEP A FOOD LOG Write down every food item that you eat, how much of the food you eat, and the number of calories in each food that you eat during the day. At the end of the day or throughout the day you can add up the total number of calories you have eaten.  It may help to set up a list like the one below. Find out the calorie information by reading food labels.  Breakfast   Bran Flakes (1 cup, 110 calories).   Fat free milk ( cup, 45 calories).   Snack   Apple (1 medium, 80 calories).   Lunch   Spinach (1 cup, 20 calories).   Tomato ( medium, 20 calories).   Chicken breast strips (3 oz, 165 calories).   Shredded cheddar cheese ( cup, 110 calories).   Light [place] dressing (2 Tbsp, 60 calories).   Whole wheat bread (1 slice, 80 calories).   Tub margarine (1 tsp, 35 calories).   Vegetable soup (1 cup, 160 calories).   Dinner   Pork chop (3 oz, 190 calories).   Brown rice (1 cup, 215 calories).   Steamed broccoli ( cup, 20 calories).   Strawberries (1  cup, 65 calories).   Whipped cream (1 Tbsp, 50 calories).  Daily Calorie Total: 1425 Information from www.eatright.org, Foodwise Nutritional Analysis Database. Document Released: 05/15/2005 Document Re-Released: 06/06/2009 [facility] Patient Information 2011 [place], [place].

## 2010-09-27 ENCOUNTER — Telehealth: Payer: Self-pay | Admitting: Family Medicine

## 2010-09-27 NOTE — Telephone Encounter (Signed)
Calling to let us know pt noshowed 1:30 appt today

## 2010-10-11 NOTE — Op Note (Signed)
NAMEYolonda Kida Chase             ACCOUNT NO.:  [id number]   MEDICAL RECORD NO.:  [id number]          PATIENT TYPE:  INP   LOCATION:  9109                          [facility]ILITY:  [facility]   PHYSICIAN:  Osborn Coho, M.D.   DATE OF BIRTH:  [birth year]   DATE OF PROCEDURE:  02/01/2007  DATE OF DISCHARGE:                               OPERATIVE REPORT   PREOPERATIVE DIAGNOSES:  1. Status post normal spontaneous vaginal delivery.  2. Multiparity.  3. Desires permanent sterilization.   POSTOPERATIVE DIAGNOSES:  1. Status post normal spontaneous vaginal delivery.  2. Multiparity.  3. Desires permanent sterilization.   PROCEDURE:  Postpartum bilateral tubal ligation.   ANESTHESIA:  Epidural.   SURGEON:  Osborn Coho, M.D.   FLUIDS:  1000 mL.   URINE OUTPUT:  Voided prior to procedure.   ESTIMATED BLOOD LOSS:  Minimal.   COMPLICATIONS:  None.   FINDINGS:  Normal-appearing bilateral fallopian tubes.   DESCRIPTION OF PROCEDURE:  The patient was taken to the operating room  after the risks, benefits, and alternatives were reviewed with the  patient.  The patient verbalized an understanding and consent signed and  witnessed.  The patient was given a surgical level via the epidural and  prepped and draped in the normal sterile fashion.  A 10-mm incision was  made at the umbilicus and carried down to the underlying layer of fascia  which was excised with the Mayo scissors.  The peritoneum was entered  with the Metzenbaum scissors  while tenting it up and noting it to be  clear.  Army-[employer]y retractors were placed and right fallopian tube was  identified, grasped with the Babcock and carried out to its fimbriated  end.  Near the isthmic portion, the Tanja Port was placed and tube was  ligated twice with #0 plain ties.  The portion of the fallopian tube was  excised and sent to pathology.  The remaining stump was cauterized with  the Bovie.  There was good hemostasis and the tube was returned  to the  intra-abdominal cavity.  The same was done on the left fallopian tube  after carrying it out to its fimbriated end.  The peritoneum and  fascia was repaired with #0 Vicryl in a running fashion.  The skin was  repaired with 3-0 Monocryl via a subcuticular stitch.  Sponge, lap and  needle count was correct.  The patient tolerated the seizure procedure  well and was awaiting transfer to the recovery room in good condition.      Osborn Coho, M.D.  Electronically Signed     AR/MEDQ  D:  02/01/2007  T:  02/01/2007  Job:  [id number]
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
## 2010-10-11 NOTE — Discharge Summary (Signed)
NAMEYolonda Chase Chase             ACCOUNT NO.:  [id number]   MEDICAL RECORD NO.:  [id number]          PATIENT TYPE:  INP   LOCATION:  9109                          [facility]   PHYSICIAN:  Osborn Coho, M.D.   DATE OF BIRTH:  June 08, 1975   DATE OF ADMISSION:  01/30/2007  DATE OF DISCHARGE:  02/02/2007                               DISCHARGE SUMMARY   ADMITTING DIAGNOSES:  1. An intrauterine pregnancy at 39 weeks.  2. Hyperthyroidism, currently euthyroid.  3. Desires tubal sterilization.  4. Unfavorable cervix.   DISCHARGE DIAGNOSIS:  1. An intrauterine pregnancy at 39 weeks.  2. Hyperthyroidism, currently euthyroid.  3. Desires tubal sterilization.  4. Unfavorable cervix.   PROCEDURE:  Postpartum bilateral tubal ligation.   HOSPITAL COURSE:  [name] is a 35 year old gravida 4, para 3-0-0-3  who was admitted at 39 weeks for induction secondary to hyperthyroidism.  Her pregnancy has been followed by the [facility] certified  nurse midwife service and has been remarkable for:  1. Bipolar but not requiring medications.  2. Hyperthyroidism with medication recently stopped due to assessment      by endocrinology showing euthyroid.  3. Penicillin allergy.  4. Rh negative.  5. History of hyperemesis.  6. Desires tubal sterilization with a consent signed on November 07, 2006.  7. Group B strep negative.   Upon admission, the patient's vital signs were stable.  She was  afebrile.  Fetal heart rate was reactive and reassuring.  Contractions  were irregular about every 10 minutes.  Cervix was 1 cm, 50%, vertex 3-  and Cervidil was placed for cervical ripening.  That was removed  approximately 12 hours later with Pitocin been started at that time to  facilitate her induction in approximately 4:00 in the afternoon.  Her  membranes were ruptured for clear fluid.  Cervix was 2 cm, 50% at that  point.  Also, at that time, her thyroid levels had returned with TSH  been  2.241 and free T4 being 0.84.  Pitocin was continued to be  increased, and the patient became completely dilated at 9:48 p.m. with  spontaneous vaginal delivery at 9:53 p.m. for a viable female infant  named [place].  Weight was 6 pounds and 5 ounces.  Apgars were 9 and 9.  No lacerations were noted.  Infant was taken to the full-term nursery in  good condition.  A bilateral tubal ligation was planned.  The patient  was kept n.p.o. after midnight and the tubal ligation was performed on  the morning of postpartum day #1.  The patient tolerated that procedure  well.  It was performed by Dr. Osborn Coho.  She was taken back to  her postpartum room in good condition.  By postpartum day #2, the  patient was doing well and was ready to go home.  She was breast-feeding  and that was going well.  Her hemoglobin on the day prior had been 11.1  and was 11.4 predelivery.  The patient's incision was dry and intact.  Physical exam was within normal limits.  She was deemed to have received  the full benefit of her hospital stay and was discharged home.   DISCHARGE MEDICATIONS:  Motrin 600 mg one p.o. q.6 h p.r.n. pain.   DISCHARGE INSTRUCTIONS:  Per [facility] handout.   DISCHARGE FOLLOW-UP:  Will occur in 6 weeks at [facility] or as  needed.      Cam Hai, C.N.M.      Osborn Coho, M.D.  Electronically Signed    KS/MEDQ  D:  02/02/2007  T:  02/02/2007  Job:  [id number]

## 2010-10-11 NOTE — Consult Note (Signed)
[facility]                          ENDOCRINOLOGY CONSULTATION   NAME:Monica Chase, Monica Chase                    MRN:          [id number]  DATE:12/31/2006                            DOB:          09-14-1975    REASON FOR VISIT:  Follow up thyroid.   HISTORY OF PRESENT ILLNESS:  A 35 year old woman with hyperthyroidism  and now 35 weeks plus 1 day gestation.  She was last seen here April 24  and did not return for follow-up as advised.  She ran out of her PTU  (150 mg twice a day) several days ago.  She has gained 29 pounds since  her first office visit early in pregnancy in March 2008.   PAST MEDICAL HISTORY:  Otherwise healthy.   REVIEW OF SYSTEMS:  Denies fever.   PHYSICAL EXAMINATION:  Blood pressure is 109/66, heart rate is 81,  temperature is 98.1, the weight is 250.  GENERAL:  No distress.  She is gravid.  NECK:  Thyroid is slightly enlarged with an irregular surface.  SKIN:  Not diaphoretic.  No rash.  NEUROLOGIC:  No tremor.   LABORATORY STUDIES:  On December 31, 2006, TSH normal at 1.073, free T4  0.5.   IMPRESSION:  1. Hyperthyroidism, well-controlled.  2. Thirty-five weeks' gestation.  3. Nonadherence with follow-up and medication.   PLAN:  1. I have advised her to stay off the medication for now.  2. Return 4-6 weeks after the birth of your baby.     Sean A. Everardo All, MD  Electronically Signed    SAE/MedQ  DD: 01/03/2007  DT: 01/03/2007  Job #: [id number]   cc:   Dois Davenport A. Rivard, M.D.  [facility]

## 2010-10-14 NOTE — Discharge Summary (Signed)
Monica Chase, Monica Chase                       ACCOUNT NO.:  [id number]   MEDICAL RECORD NO.:  [id number]                   PATIENT TYPE:  INP   LOCATION:  9325                                 FACILITY:  [facility]   PHYSICIAN:  Kathreen Cosier, M.D.           DATE OF BIRTH:  [birth year]   DATE OF ADMISSION:  04/30/2003  DATE OF DISCHARGE:  05/05/2003                                 DISCHARGE SUMMARY   The patient is a 35 year old gravida 3, para 2-0-0-2, EDC November 27, 2003.  This is her second admission for hyperemesis for her pregnancy.  The patient  lost a total of 20 pounds.  On admission she had a nutrition consult, and  her hemoglobin was 14.4, white count 7.5, platelets 344.  Sodium 136,  potassium 3.9, chloride 101, BUN 4, creatinine 0.6, glucose 81.  She had IV  fluids administered and kept vomiting so she was placed on steroid taper and  responded promptly.  Her hyperemesis ceased, and she was sent home on Reglan  10 mg p.o. 3 times daily.  To see me in 2 weeks.                                               Kathreen Cosier, M.D.    BAM/MEDQ  D:  06/17/2003  T:  06/17/2003  Job:  [id number]

## 2010-10-14 NOTE — Consult Note (Signed)
[facility]                          ENDOCRINOLOGY CONSULTATION   NAME:Monica Chase, Monica Chase                    MRN:          [id number]  DATE:07/30/2006                            DOB:          Dec 09, 1975    REFERRING PHYSICIAN:  Dois Davenport A. Rivard, M.D.   REASON FOR REFERRAL:  Hyperthyroidism.   HISTORY OF PRESENT ILLNESS:  A 35 year old woman who is now 12 weeks  plus 6 days gestation.  She was recently admitted for hyperemesis and  was found to be hyperthyroid.  Symptomatically, she has two months of  palpitations and excessive diaphoresis and some associated tremor in her  hands.   PAST MEDICAL HISTORY:  1. Constipation.  2. Gravida 4, para 3.  3. No previous history of thyroid disease.   SOCIAL HISTORY:  She is divorced.  She works Designer, television/film set.   FAMILY HISTORY:  Negative for thyroid disease.   REVIEW OF SYSTEMS:  She has lost 40 pounds over the past few month and  has regained 6 of those pounds.  She denies fever, shortness of breath  and syncope.   PHYSICAL EXAMINATION:  VITAL SIGNS:  Blood pressure 123/70, heart rate  112, temperature 97.1, weight 221.  GENERAL:  No distress.  SKIN:  Slightly diaphoretic, no rash.  HEENT:  No proptosis, minimal periorbital swelling.  NECK:  The thyroid is slightly enlarged and bigger on the right than on  the left.  No discrete nodule .  CHEST:  Clear to auscultation.  No respiratory distress.  CARDIOVASCULAR:  No JVD.  No Edema.  Regular rate and rhythm, no murmur.  NEUROLOGICAL:  Alert and oriented.  Does not appear anxious nor  depressed at this time, and she has a slight postural tremor present.   LABORATORY DATA:  On July 04, 2006, TSH 0.005, free T4 1.93.   IMPRESSION:  1. Hyperthyroidism which is usually caused by Graves' disease in this      setting.  2. About [redacted] weeks gestation, and her hyperemesis appears to be      slightly better.  3. Constipation, not thyroid related.   PLAN:  1. We discussed the natural history of hyperthyroidism and I gave her      a brochure.  2. PTU 150 mg b.i.d.  3. Return in three weeks.  4. I have advised her that because of a rare side effect of the PTU,      if she has fever, she must discontinue it at once and call us.     Sean A. Everardo All, MD  Electronically Signed    SAE/MedQ  DD: 07/31/2006  DT: 08/01/2006  Job #: [id number]   cc:   Dois Davenport A. Rivard, M.D.  Shelbie Proctor. Shawnie Pons, M.D.

## 2010-10-14 NOTE — Discharge Summary (Signed)
NAMEYolonda Kida Chase             ACCOUNT NO.:  [id number]   MEDICAL RECORD NO.:  [id number]          PATIENT TYPE:  INP   LOCATION:  9310                          FACILITY:  [facility]   PHYSICIAN:  Hal Morales, M.D.DATE OF BIRTH:  03-18-1976   DATE OF ADMISSION:  07/04/2006  DATE OF DISCHARGE:  07/05/2006                               DISCHARGE SUMMARY   ADMISSION DIAGNOSIS:  1. A intrauterine pregnancy at 8-1/2 weeks.  2. Hyperemesis.   DISCHARGE DIAGNOSES:  1. A intrauterine pregnancy at 8-1/2 weeks.  2. Hyperemesis.  3. Constipation.  4. Possible hyperthyroidism, unconfirmed.   HOSPITAL PROCEDURES:  IV fluids.   HOSPITAL COURSE:  The patient was admitted for persistent nausea,  vomiting and constipation.  She has been on Phenergan and Zofran at home  and remains unable to keep down food or fluids for several weeks.  She  reports a history of constipation with no bowel movement for the past  month. Her usual history is remarkable for bowel movement once per week.  She was given an IV and hydrated with IV fluids.  Labs were drawn for  chemistries, amylase, lipase and thyroid TSH. She received fluids during  the night with Phenergan and this morning is not having any nausea or  vomiting.  She had a glycerin suppository for her constipation with no  results last night. On July 05, 2006 her vital signs were stable.  She was afebrile. CBC shows no anemia and a normal white blood cell  count.  Chemistries were all normal.  Amylase and lipase were both  normal and TSH is low at 0.005.  Urinalysis was remarkable for ketones  and she has abdomen which was soft and nontender. Heart regular rate and  rhythm.  Chest; clear.  She was deemed to have received full benefit of  her hospital stay and was discharged home after receiving soapsuds  enema.   DISCHARGE LABS:  White blood cell count 8.0, hemoglobin 14.1, platelets  318. Sodium 134, potassium 3.5, creatinine 0.45, AST 14, ALT  less than  8, amylase 60, lipase 14.  TSH 0.005.   DISCHARGE MEDICATIONS:  1. The patient will receive soapsuds enema prior to discharge.  2. She has Zofran and Phenergan at home for p.r.n. use.  3. She will be given prenatal vitamins once she is able to tolerate.  4. She will also begin using Benefiber and Colace every day.   DISCHARGE INSTRUCTIONS:  Include increased water and fiber intake with  good bowel habits. We will follow-up on her thyroid studies once her  remaining studies are out. We will be getting a T3-T4 and thyroxine  level today.   DISCHARGE FOLLOW UP:  As scheduled in the office for a new OB workup and  p.r.n. as indicated.   CONDITION ON DISCHARGE:  Good.      Marie L. Williams, C.N.M.      Hal Morales, M.D.  Electronically Signed    MLW/MEDQ  D:  07/05/2006  T:  07/05/2006  Job:  [id number]

## 2010-10-14 NOTE — H&P (Signed)
NAMEYolonda Chase Merryn             ACCOUNT NO.:  [id number]   MEDICAL RECORD NO.:  [id number]          PATIENT TYPE:  MAT   LOCATION:  MATC                          [facility]ACILITY:  [facility]   PHYSICIAN:  Janine Limbo, M.D.DATE OF BIRTH:  March 31, 1976   DATE OF ADMISSION:  07/04/2006  DATE OF DISCHARGE:                              HISTORY & PHYSICAL   [name] is a 35 year old gravida 4, para 3-0-0-3 who presents at 8  weeks 5 days, [facility] February 06, 2007 as determined by 7-1/2-week  ultrasound.  This patient presents with persistent nausea and vomiting  ongoing for several weeks.  She states she has been unable to keep down  food or fluids for greater than 24 hours.  Also reports extreme  constipation and has not had a bowel movement the patient states for 1  month.  Despite 1 liter IV fluids plus Phenergan as well as Zofran IV,  the patient remains unable to keep down food or fluid.  She is therefore  admitted for IV hydration and antiemetics.   PAST MEDICAL HISTORY:  Is significant for asthma and removal of  pilonidal cyst.  The patient has a history of chlamydia and Trichomonas.   OB HISTORY:  Three spontaneous vaginal deliveries at term.   FAMILY HISTORY:  The patient's mother, father and maternal grandmother  with a history of heart disease.  Otherwise family history unremarkable.   The patient's surgical history is none.   ALLERGIES:  PENICILLIN.   The patient denies the use of tobacco, alcohol or illicit drugs.   LAB WORK:  Clean catch urine specimen shows specific gravity greater  than 1.030, pH 6, ketones 40 mg/dL.  The patient is to have a CBC,  comprehensive metabolic panel, TSH, amylase and lipase on admission.   REVIEW OF SYSTEMS:  Is as described above.  The patient is 8-1/[redacted] weeks  pregnant with hyperemesis.   PHYSICAL EXAM:  VITAL SIGNS:  Stable.  Temperature 98.2, pulse 83,  respirations 20, blood pressure 123/72.  HEENT:  Is unremarkable.  HEART:  Regular  rate and rhythm.  LUNGS:  Are clear.  ABDOMEN:  Is soft and nontender.  Negative CVA tenderness is noted  bilaterally.  EXTREMITIES:  Show no pathologic edema.  DTRs are 1+ with  no clonus.   ASSESSMENT:  1. Intrauterine pregnancy at 8 weeks 5 days.  2. Hyperemesis.   PLAN:  Admit per Dr. Marline Backbone.  IV D5 LR at 250 mL per hour x4  hours, then 125 mL per hour.  Orders have been written for Phenergan 25  mg IV q.6h. p.r.n. and glycerin suppositories for constipation in the  home.      Rica Koyanagi, C.N.M.      Janine Limbo, M.D.  Electronically Signed    SDM/MEDQ  D:  07/04/2006  T:  07/04/2006  Job:  [id number]

## 2010-10-14 NOTE — Discharge Summary (Signed)
Monica Chase, Monica Chase                       ACCOUNT NO.:  [id number]   MEDICAL RECORD NO.:  [id number]                   PATIENT TYPE:  INP   LOCATION:  9107                                 FACILITY:  [facility]   PHYSICIAN:  Roseanna Rainbow, M.D.         DATE OF BIRTH:  01-16-1976   DATE OF ADMISSION:  05/20/2003  DATE OF DISCHARGE:  05/25/2003                                 DISCHARGE SUMMARY   CHIEF COMPLAINT:  The patient is a 35 year old para 2, complaining of nausea  and vomiting.   HISTORY OF PRESENT ILLNESS:  The patient has an early pregnancy.  The nausea  and vomiting has lessened over the past several days.   PHYSICAL EXAMINATION:  VITAL SIGNS:  Stable, afebrile.  GENERAL:  Well-developed female.  The remainder of the exam was deferred.   LABORATORY DATA:  Remarkable for a potassium of 3.5.   ASSESSMENT:  Hyperemesis gravidarum.   PLAN:  Admission.   HOSPITAL COURSE:  The patient was admitted and started on Phenergan and a  steroid taper.  Her nausea and vomiting improved.  The patient also has a  history of depression and a social work consult was obtained.  She was  discharged home on hospital day number 5, tolerating a regular diet.   DISCHARGE DIAGNOSES:  1. Intrauterine pregnancy at 13 plus weeks.  2. Hyperemesis gravidarum.   CONDITION ON DISCHARGE:  Stable.   DIET:  Regular as tolerated.   DISCHARGE MEDICATIONS:  1. Methylprednisolone.  2. Phenergan.  3. Zoloft.   ACTIVITY:  Ad lib.   DISPOSITION:  The patient was to follow up in the office in several days.                                              Roseanna Rainbow, M.D.   Monica Chase  D:  07/02/2003  T:  07/03/2003  Job:  [id number]

## 2010-11-09 ENCOUNTER — Encounter: Payer: Self-pay | Admitting: Family Medicine

## 2010-11-09 ENCOUNTER — Ambulatory Visit: Payer: Medicaid Other

## 2010-11-09 ENCOUNTER — Ambulatory Visit (INDEPENDENT_AMBULATORY_CARE_PROVIDER_SITE_OTHER): Payer: Medicaid Other | Admitting: Family Medicine

## 2010-11-09 VITALS — BP 122/85 | HR 76 | Ht 71.0 in | Wt 250.3 lb

## 2010-11-09 DIAGNOSIS — R42 Dizziness and giddiness: Secondary | ICD-10-CM

## 2010-11-09 DIAGNOSIS — R7309 Other abnormal glucose: Secondary | ICD-10-CM

## 2010-11-09 HISTORY — DX: Dizziness and giddiness: R42

## 2010-11-09 LAB — POCT GLYCOSYLATED HEMOGLOBIN (HGB A1C): Hemoglobin A1C: 5

## 2010-11-09 NOTE — Progress Notes (Signed)
  Subjective:    Patient ID: Monica Chase, female    DOB: 09-30-1975, 35 y.o.   MRN: [id number]  HPI Several problems which may or may not be related. Dizziness, not clearly described as vertigo.  Strange feeling in head.  No true room spinning.  At bit like orthostasis but can occur while lying down Very high stress time.  Coming up on 1 year anniversary of death of mom Worried re DM.  Runs in family.  Polyuria.  Occ. Blurred vision.   Lack of appetite:  Denies depression: "I know what depression is."    Review of Systems     Objective:   Physical Exam HEENT nl Neck supple Lungs clear Cardiac RRR without murmur       Assessment & Plan:

## 2010-11-09 NOTE — Assessment & Plan Note (Signed)
No clear etiology.  She will keep a log and look for patterns.  Previously treated with meclizine which made her too sleepy.  I think may be related to depression.

## 2010-11-09 NOTE — Patient Instructions (Addendum)
Look for patterns about when the dizziness occurs.  Only when standing?  Only when you've skipped meals?  Only when stressed. Please make sure to drink plenty of fluids.   Try over the counter Bonine take at night.  Give it two or three tries.  If it works, keep taking it. Check back with Dr. Shawnie Pons in 3-6 weeks when you have something to tell her.

## 2010-11-09 NOTE — Assessment & Plan Note (Signed)
Great A1C, no evidence of DM

## 2010-12-01 ENCOUNTER — Emergency Department (HOSPITAL_COMMUNITY)
Admission: EM | Admit: 2010-12-01 | Discharge: 2010-12-01 | Discharge status: Against Medical Advice | Payer: Self-pay | Attending: Emergency Medicine | Admitting: Emergency Medicine

## 2010-12-01 DIAGNOSIS — R0602 Shortness of breath: Secondary | ICD-10-CM | POA: Insufficient documentation

## 2010-12-01 DIAGNOSIS — R079 Chest pain, unspecified: Secondary | ICD-10-CM | POA: Insufficient documentation

## 2011-02-15 LAB — HERPES SIMPLEX VIRUS CULTURE

## 2011-02-20 ENCOUNTER — Encounter: Payer: Self-pay | Admitting: Endocrinology

## 2011-02-20 ENCOUNTER — Ambulatory Visit (INDEPENDENT_AMBULATORY_CARE_PROVIDER_SITE_OTHER): Payer: Medicaid Other | Admitting: Endocrinology

## 2011-02-20 ENCOUNTER — Other Ambulatory Visit (INDEPENDENT_AMBULATORY_CARE_PROVIDER_SITE_OTHER): Payer: Medicaid Other

## 2011-02-20 VITALS — BP 112/70 | HR 61 | Temp 97.7°F | Ht 71.0 in | Wt 250.6 lb

## 2011-02-20 DIAGNOSIS — E0590 Thyrotoxicosis, unspecified without thyrotoxic crisis or storm: Secondary | ICD-10-CM

## 2011-02-20 NOTE — Patient Instructions (Addendum)
blood tests are being requested for you today.  please call [phone] to hear your test results.  You will be prompted to enter the 9-digit "MRN" number that appears at the top left of this page, followed by #.  Then you will hear the message. Please make a follow-up appointment in 4 months if ever you have fever while taking methimazole, stop it and call us, because of the risk of a rare side-effect (update: i left message on phone-tree:  rx as we discussed)

## 2011-02-20 NOTE — Progress Notes (Signed)
  Subjective:    Patient ID: Monica Chase, female    DOB: [birth year], 35 y.o.   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  It was first dx'ed during a pregnancy.  She says the tapazole causes fatigue, so she only takes it 3x/week.  She says she is not sexually active, and is not pursuing a pregnancy now.   Past Medical History  Diagnosis Date  . HYPERTHYROIDISM 07/20/2009  . DEPRESSION, MAJOR, RECURRENT 07/26/2006  . ANXIETY 07/26/2006  . ASTHMA, INTERMITTENT 07/26/2006  . HYPERGLYCEMIA 09/28/2009  . CONSTIPATION, CHRONIC 06/22/2010  . Dizziness 11/09/2010  . OBESITY 01/31/2008    Past Surgical History  Procedure Date  . Tubal ligation 2008  . Iud removal 10/07    Mirena removed 05/10/2004    History   Social History  . Marital Status: Divorced    Spouse Name: N/A    Number of Children: N/A  . Years of Education: N/A   Occupational History  . Not on file.   Social History Main Topics  . Smoking status: Former Smoker    Quit date: 11/08/2005  . Smokeless tobacco: Never Used  . Alcohol Use: No  . Drug Use: No  . Sexually Active: Not on file   Other Topics Concern  . Not on file   Social History Narrative   single    Current Outpatient Prescriptions on File Prior to Visit  Medication Sig Dispense Refill  . albuterol (PROVENTIL,VENTOLIN) 90 MCG/ACT inhaler Inhale 2 puffs into the lungs every 4 (four) hours as needed.  17 g  3  . methimazole (TAPAZOLE) 5 MG tablet Take 5 mg by mouth daily.          Allergies  Allergen Reactions  . Penicillins     Family History  Problem Relation Age of Onset  . Hypertension Other     BP 112/70  Pulse 61  Temp(Src) 97.7 F (36.5 C) (Oral)  Ht 5\' 11"  (1.803 m)  Wt 250 lb 9.6 oz (113.671 kg)  BMI 34.95 kg/m2  SpO2 98%  LMP 02/06/2011    Review of Systems Denies fever.      Objective:   Physical Exam VITAL SIGNS:  See vs page GENERAL: no distress Neck: thyroid is 4x normal  sixe, left > right, no nodule.     Lab Results  Component Value Date   TSH 0.99 02/20/2011       Assessment & Plan:  Hyperthyroidism, well-controlled

## 2011-02-21 LAB — TSH: TSH: 0.99 u[IU]/mL (ref 0.35–5.50)

## 2011-02-27 LAB — POCT RAPID STREP A: Streptococcus, Group A Screen (Direct): NEGATIVE

## 2011-03-03 LAB — WET PREP, GENITAL: Trich, Wet Prep: NONE SEEN

## 2011-03-03 LAB — POCT URINALYSIS DIP (DEVICE)
Bilirubin Urine: NEGATIVE
Glucose, UA: NEGATIVE mg/dL
Nitrite: NEGATIVE
pH: 6.5 (ref 5.0–8.0)

## 2011-03-03 LAB — GC/CHLAMYDIA PROBE AMP, GENITAL: GC Probe Amp, Genital: NEGATIVE

## 2011-03-03 LAB — POCT PREGNANCY, URINE: Preg Test, Ur: NEGATIVE

## 2011-03-10 LAB — CBC
MCHC: 35.5
MCHC: 35.5
Platelets: 286
RBC: 3.61 — ABNORMAL LOW
RBC: 3.69 — ABNORMAL LOW
RDW: 13.3
WBC: 10.6 — ABNORMAL HIGH

## 2011-03-10 LAB — URINALYSIS, ROUTINE W REFLEX MICROSCOPIC
Glucose, UA: NEGATIVE
Hgb urine dipstick: NEGATIVE
Ketones, ur: 15 — AB
Protein, ur: NEGATIVE

## 2011-03-10 LAB — RH IMMUNE GLOB WKUP(>/=20WKS)(NOT WOMEN'S HOSP)

## 2011-03-10 LAB — RPR
RPR Ser Ql: NONREACTIVE
RPR Ser Ql: NONREACTIVE

## 2011-03-10 LAB — T4, FREE: Free T4: 0.84 — ABNORMAL LOW

## 2011-03-15 LAB — URINALYSIS, ROUTINE W REFLEX MICROSCOPIC
Bilirubin Urine: NEGATIVE
Glucose, UA: NEGATIVE
Ketones, ur: NEGATIVE
pH: 6.5

## 2011-03-15 LAB — CULTURE, BETA STREP (GROUP B ONLY)

## 2011-03-15 LAB — WET PREP, GENITAL
Trich, Wet Prep: NONE SEEN
Yeast Wet Prep HPF POC: NONE SEEN

## 2011-03-15 LAB — FETAL FIBRONECTIN: Fetal Fibronectin: NEGATIVE

## 2011-03-16 ENCOUNTER — Encounter: Payer: Self-pay | Admitting: *Deleted

## 2011-03-16 ENCOUNTER — Telehealth: Payer: Self-pay | Admitting: *Deleted

## 2011-03-16 LAB — RH IMMUNE GLOBULIN WORKUP (NOT WOMEN'S HOSP): Antibody Screen: NEGATIVE

## 2011-03-16 NOTE — Telephone Encounter (Signed)
Per MD, pt is due for F/U OV. Unable to reach pt by phone (numbers are incorrect), mailed pt letter to schedule an appointment.

## 2011-04-10 ENCOUNTER — Other Ambulatory Visit: Payer: Self-pay | Admitting: Endocrinology

## 2011-11-03 ENCOUNTER — Ambulatory Visit: Payer: Medicaid Other | Admitting: Family Medicine

## 2011-11-22 ENCOUNTER — Encounter: Payer: Medicaid Other | Admitting: Family Medicine

## 2011-12-27 ENCOUNTER — Encounter: Payer: Self-pay | Admitting: Family Medicine

## 2011-12-27 ENCOUNTER — Ambulatory Visit (INDEPENDENT_AMBULATORY_CARE_PROVIDER_SITE_OTHER): Payer: Medicaid Other | Admitting: Family Medicine

## 2011-12-27 VITALS — BP 118/74 | HR 84 | Temp 98.4°F | Ht 71.0 in | Wt 251.9 lb

## 2011-12-27 DIAGNOSIS — K5909 Other constipation: Secondary | ICD-10-CM

## 2011-12-27 LAB — TSH: TSH: 0.811 u[IU]/mL (ref 0.350–4.500)

## 2011-12-27 MED ORDER — ONDANSETRON HCL 8 MG PO TABS: 8.0000 mg | ORAL_TABLET | Freq: Three times a day (TID) | ORAL | Status: AC | PRN

## 2011-12-27 MED ORDER — GLYCERIN (LAXATIVE) 2.1 G RE SUPP: 1.0000 | Freq: Every day | RECTAL | Status: DC | PRN

## 2011-12-27 NOTE — Assessment & Plan Note (Signed)
A: chronic problem. Patient has no stool regimen. Her thyroid dysfunction may be contributing to her symptoms,  Although hyperthyroidism is usually associated with diarrhea.   P: -zofran prn nausea -colace supp prn constipation -ABXray to evaluate stool burden -GI referral made -check TSH

## 2011-12-27 NOTE — Progress Notes (Signed)
Subjective:     Patient ID: Monica Chase, female   DOB: 05-01-1976, 36 y.o.   MRN: [id number]  HPI 36 yo F with history significant for constipation and hyperthyroidism (no longer taking methimazole 2 months, per patient weaned off by Endocrinology, Dr. Al Pimple) presents with 2 weeks of constipation. Constipation associated with nausea and stomach pain. She denies fever and vomiting. She was able to produce a few hard stool pebbles last week associated with scant blood. She has had issues with constipation all of her life. She has been evaluated by GI in the past, 4 years ago. She is not taking anything for her constipation currently.   Review of Systems As per HPI  Objective:   Physical Exam BP 118/74  Pulse 84  Temp 98.4 F (36.9 C) (Oral)  Ht 5\' 11"  (1.803 m)  Wt 251 lb 14.4 oz (114.261 kg)  BMI 35.13 kg/m2  LMP 12/02/2011 General appearance: alert, cooperative and no distress Abdomen: soft, non-tender; bowel sounds hypoactive; no masses,  no organomegaly Pelvic: rectovaginal septum normal, FOBT negative, no stool in rectal vault.     Assessment and Plan:

## 2011-12-27 NOTE — Patient Instructions (Addendum)
Amahia,  Thank you for coming in today.   For you constipation: 1. GI referral made. 2. Please get abdominal x-ray 3. Take zofran for nausea and use colace suppository to hopefully produce stool 4. As much as possible drink water and eat fiber.   Dr. Armen Pickup

## 2011-12-28 ENCOUNTER — Encounter: Payer: Self-pay | Admitting: Family Medicine

## 2012-01-04 ENCOUNTER — Ambulatory Visit (HOSPITAL_COMMUNITY)
Admission: RE | Admit: 2012-01-04 | Discharge: 2012-01-04 | Disposition: A | Discharge status: Home / Self Care | Payer: Medicaid Other | Source: Ambulatory Visit | Attending: Family Medicine | Admitting: Family Medicine

## 2012-01-04 DIAGNOSIS — K5909 Other constipation: Secondary | ICD-10-CM | POA: Insufficient documentation

## 2012-03-19 ENCOUNTER — Encounter: Payer: Self-pay | Admitting: Family Medicine

## 2012-03-19 ENCOUNTER — Ambulatory Visit (INDEPENDENT_AMBULATORY_CARE_PROVIDER_SITE_OTHER): Payer: Medicaid Other | Admitting: Family Medicine

## 2012-03-19 VITALS — BP 127/85 | HR 82 | Temp 98.4°F | Ht 71.0 in | Wt 254.0 lb

## 2012-03-19 DIAGNOSIS — K029 Dental caries, unspecified: Secondary | ICD-10-CM | POA: Insufficient documentation

## 2012-03-19 DIAGNOSIS — E669 Obesity, unspecified: Secondary | ICD-10-CM

## 2012-03-19 DIAGNOSIS — E0590 Thyrotoxicosis, unspecified without thyrotoxic crisis or storm: Secondary | ICD-10-CM

## 2012-03-19 DIAGNOSIS — J45909 Unspecified asthma, uncomplicated: Secondary | ICD-10-CM

## 2012-03-19 DIAGNOSIS — F339 Major depressive disorder, recurrent, unspecified: Secondary | ICD-10-CM

## 2012-03-19 DIAGNOSIS — K5909 Other constipation: Secondary | ICD-10-CM

## 2012-03-19 LAB — COMPREHENSIVE METABOLIC PANEL
ALT: <8 U/L (ref 0–35)
AST: 10 U/L (ref 0–37)
Alkaline Phosphatase: 53 U/L (ref 39–117)
CO2: 25 mEq/L (ref 19–32)
Sodium: 138 mEq/L (ref 135–145)
Total Bilirubin: 0.4 mg/dL (ref 0.3–1.2)
Total Protein: 7.3 g/dL (ref 6.0–8.3)

## 2012-03-19 LAB — LIPID PANEL
HDL: 47 mg/dL (ref >39)
LDL Cholesterol: 149 mg/dL — ABNORMAL HIGH (ref 0–99)
VLDL: 17 mg/dL (ref 0–40)

## 2012-03-19 MED ORDER — ALBUTEROL SULFATE HFA 108 (90 BASE) MCG/ACT IN AERS: 2.0000 | INHALATION_SPRAY | Freq: Four times a day (QID) | RESPIRATORY_TRACT | Status: DC | PRN

## 2012-03-19 MED ORDER — ERYTHROMYCIN BASE 250 MG PO TABS: 250.0000 mg | ORAL_TABLET | Freq: Four times a day (QID) | ORAL | Status: DC

## 2012-03-19 NOTE — Assessment & Plan Note (Signed)
Has been off medication since March.  Needs this to be checked.

## 2012-03-19 NOTE — Assessment & Plan Note (Addendum)
Needs counseling however she is very tentative about going.  She seems to be worried about opening up, and that her confidences will not be kept.  I have advised her the only way for counseling to work is if she does just that.  Will mail pt. A list of providers.

## 2012-03-19 NOTE — Progress Notes (Signed)
  Subjective:    Patient ID: Monica Chase, female    DOB: 07/28/1975, 36 y.o.   MRN: [id number]  HPI Here with multiple complaints. 1.  Tooth breaking off 2.  Wants her TSH checked.  Off meds since March.  Has occasional palpitations. 3.  Depression continues to be a problem.  Doesn't trust most people. 4.  Constipation - missed her referral appointment.   Review of Systems  Constitutional: Positive for fatigue. Negative for fever.  Cardiovascular: Positive for palpitations.  Gastrointestinal: Positive for constipation.  Psychiatric/Behavioral: Positive for disturbed wake/sleep cycle and dysphoric mood.  All other systems reviewed and are negative.       Objective:   Physical Exam  Vitals reviewed. Constitutional: She appears well-developed and well-nourished. No distress.  HENT:  Head: Normocephalic and atraumatic.  Neck: Neck supple.  Cardiovascular: Normal rate.   Pulmonary/Chest: Effort normal.  Abdominal: Soft. There is no tenderness.  Skin: Skin is warm and dry.  Psychiatric: She has a normal mood and affect.          Assessment & Plan:

## 2012-03-19 NOTE — Assessment & Plan Note (Signed)
Refilled her albuterol  

## 2012-03-19 NOTE — Assessment & Plan Note (Signed)
Continue exercising

## 2012-03-19 NOTE — Patient Instructions (Signed)
Depression, Adult Depression refers to feeling sad, low, down in the dumps, blue, gloomy, or empty. In general, there are two kinds of depression: 1. Depression that we all experience from time to time because of upsetting life experiences, including the loss of a job or the ending of a relationship (normal sadness or normal grief). This kind of depression is considered normal, is short lived, and resolves within a few days to 2 weeks. (Depression experienced after the loss of a loved one is called bereavement. Bereavement often lasts longer than 2 weeks but normally gets better with time.) 2. Clinical depression, which lasts longer than normal sadness or normal grief or interferes with your ability to function at home, at work, and in school. It also interferes with your personal relationships. It affects almost every aspect of your life. Clinical depression is an illness. Symptoms of depression also can be caused by conditions other than normal sadness and grief or clinical depression. Examples of these conditions are listed as follows:  Physical illness Some physical illnesses, including underactive thyroid gland (hypothyroidism), severe anemia, specific types of cancer, diabetes, uncontrolled seizures, heart and lung problems, strokes, and chronic pain are commonly associated with symptoms of depression.  Side effects of some prescription medicine In some people, certain types of prescription medicine can cause symptoms of depression.  Substance abuse Abuse of alcohol and illicit drugs can cause symptoms of depression. SYMPTOMS Symptoms of normal sadness and normal grief include the following:  Feeling sad or crying for short periods of time.  Not caring about anything (apathy).  Difficulty sleeping or sleeping too much.  No longer able to enjoy the things you used to enjoy.  Desire to be by oneself all the time (social isolation).  Lack of energy or motivation.  Difficulty  concentrating or remembering.  Change in appetite or weight.  Restlessness or agitation. Symptoms of clinical depression include the same symptoms of normal sadness or normal grief and also the following symptoms:  Feeling sad or crying all the time.  Feelings of guilt or worthlessness.  Feelings of hopelessness or helplessness.  Thoughts of suicide or the desire to harm yourself (suicidal ideation).  Loss of touch with reality (psychotic symptoms). Seeing or hearing things that are not real (hallucinations) or having false beliefs about your life or the people around you (delusions and paranoia). DIAGNOSIS  The diagnosis of clinical depression usually is based on the severity and duration of the symptoms. Your caregiver also will ask you questions about your medical history and substance use to find out if physical illness, use of prescription medicine, or substance abuse is causing your depression. Your caregiver also may order blood tests. TREATMENT  Typically, normal sadness and normal grief do not require treatment. However, sometimes antidepressant medicine is prescribed for bereavement to ease the depressive symptoms until they resolve. The treatment for clinical depression depends on the severity of your symptoms but typically includes antidepressant medicine, counseling with a mental health professional, or a combination of both. Your caregiver will help to determine what treatment is best for you. Depression caused by physical illness usually goes away with appropriate medical treatment of the illness. If prescription medicine is causing depression, talk with your caregiver about stopping the medicine, decreasing the dose, or substituting another medicine. Depression caused by abuse of alcohol or illicit drugs abuse goes away with abstinence from these substances. Some adults need professional help in order to stop drinking or using drugs. SEEK IMMEDIATE CARE IF:  You have   thoughts  about hurting yourself or others.  You lose touch with reality (have psychotic symptoms).  You are taking medicine for depression and have a serious side effect. FOR MORE INFORMATION National Alliance on Mental Illness: www.nami.[employer] of Mental Health: http://www.maynard.net/ Document Released: 05/12/2000 Document Revised: 11/14/2011 Document Reviewed: 08/14/2011 [facility] Patient Information 2013 [place], [place]. Insomnia Insomnia is frequent trouble falling and/or staying asleep. Insomnia can be a long term problem or a short term problem. Both are common. Insomnia can be a short term problem when the wakefulness is related to a certain stress or worry. Long term insomnia is often related to ongoing stress during waking hours and/or poor sleeping habits. Overtime, sleep deprivation itself can make the problem worse. Every little thing feels more severe because you are overtired and your ability to cope is decreased. CAUSES   Stress, anxiety, and depression.  Poor sleeping habits.  Distractions such as TV in the bedroom.  Naps close to bedtime.  Engaging in emotionally charged conversations before bed.  Technical reading before sleep.  Alcohol and other sedatives. They may make the problem worse. They can hurt normal sleep patterns and normal dream activity.  Stimulants such as caffeine for several hours prior to bedtime.  Pain syndromes and shortness of breath can cause insomnia.  Exercise late at night.  Changing time zones may cause sleeping problems (jet lag). It is sometimes helpful to have someone observe your sleeping patterns. They should look for periods of not breathing during the night (sleep apnea). They should also look to see how long those periods last. If you live alone or observers are uncertain, you can also be observed at a sleep clinic where your sleep patterns will be professionally monitored. Sleep apnea requires a checkup and treatment. Give your  caregivers your medical history. Give your caregivers observations your family has made about your sleep.  SYMPTOMS   Not feeling rested in the morning.  Anxiety and restlessness at bedtime.  Difficulty falling and staying asleep. TREATMENT   Your caregiver may prescribe treatment for an underlying medical disorders. Your caregiver can give advice or help if you are using alcohol or other drugs for self-medication. Treatment of underlying problems will usually eliminate insomnia problems.  Medications can be prescribed for short time use. They are generally not recommended for lengthy use.  Over-the-counter sleep medicines are not recommended for lengthy use. They can be habit forming.  You can promote easier sleeping by making lifestyle changes such as:  Using relaxation techniques that help with breathing and reduce muscle tension.  Exercising earlier in the day.  Changing your diet and the time of your last meal. No night time snacks.  Establish a regular time to go to bed.  Counseling can help with stressful problems and worry.  Soothing music and white noise may be helpful if there are background noises you cannot remove.  Stop tedious detailed work at least one hour before bedtime. HOME CARE INSTRUCTIONS   Keep a diary. Inform your caregiver about your progress. This includes any medication side effects. See your caregiver regularly. Take note of:  Times when you are asleep.  Times when you are awake during the night.  The quality of your sleep.  How you feel the next day. This information will help your caregiver care for you.  Get out of bed if you are still awake after 15 minutes. Read or do some quiet activity. Keep the lights down. Wait until you feel sleepy and go  back to bed.  Keep regular sleeping and waking hours. Avoid naps.  Exercise regularly.  Avoid distractions at bedtime. Distractions include watching television or engaging in any intense or  detailed activity like attempting to balance the household checkbook.  Develop a bedtime ritual. Keep a familiar routine of bathing, brushing your teeth, climbing into bed at the same time each night, listening to soothing music. Routines increase the success of falling to sleep faster.  Use relaxation techniques. This can be using breathing and muscle tension release routines. It can also include visualizing peaceful scenes. You can also help control troubling or intruding thoughts by keeping your mind occupied with boring or repetitive thoughts like the old concept of counting sheep. You can make it more creative like imagining planting one beautiful flower after another in your backyard garden.  During your day, work to eliminate stress. When this is not possible use some of the previous suggestions to help reduce the anxiety that accompanies stressful situations. MAKE SURE YOU:   Understand these instructions.  Will watch your condition.  Will get help right away if you are not doing well or get worse. Document Released: 05/12/2000 Document Revised: 08/07/2011 Document Reviewed: 06/12/2007 [facility] Patient Information 2013 [place], [place]. Exercise to Lose Weight Exercise and a healthy diet may help you lose weight. Your doctor may suggest specific exercises. EXERCISE IDEAS AND TIPS  Choose low-cost things you enjoy doing, such as walking, bicycling, or exercising to workout videos.  Take stairs instead of the elevator.  Walk during your lunch break.  Park your car further away from work or school.  Go to a gym or an exercise class.  Start with 5 to 10 minutes of exercise each day. Build up to 30 minutes of exercise 4 to 6 days a week.  Wear shoes with good support and comfortable clothes.  Stretch before and after working out.  Work out until you breathe harder and your heart beats faster.  Drink extra water when you exercise.  Do not do so much that you hurt yourself,  feel dizzy, or get very short of breath. Exercises that burn about 150 calories:  Running 1  miles in 15 minutes.  Playing volleyball for 45 to 60 minutes.  Washing and waxing a car for 45 to 60 minutes.  Playing touch football for 45 minutes.  Walking 1  miles in 35 minutes.  Pushing a stroller 1  miles in 30 minutes.  Playing basketball for 30 minutes.  Raking leaves for 30 minutes.  Bicycling 5 miles in 30 minutes.  Walking 2 miles in 30 minutes.  Dancing for 30 minutes.  Shoveling snow for 15 minutes.  Swimming laps for 20 minutes.  Walking up stairs for 15 minutes.  Bicycling 4 miles in 15 minutes.  Gardening for 30 to 45 minutes.  Jumping rope for 15 minutes.  Washing windows or floors for 45 to 60 minutes. Document Released: 06/17/2010 Document Revised: 08/07/2011 Document Reviewed: 06/17/2010 [facility] Patient Information 2013 [place], [place].

## 2012-03-19 NOTE — Assessment & Plan Note (Signed)
List of dentists that accept Medicaid mailed to pt.

## 2012-03-19 NOTE — Assessment & Plan Note (Signed)
Re-send to GI

## 2012-03-20 ENCOUNTER — Encounter: Payer: Self-pay | Admitting: Family Medicine

## 2012-06-18 ENCOUNTER — Ambulatory Visit: Payer: Medicaid Other

## 2012-06-25 ENCOUNTER — Telehealth: Payer: Self-pay | Admitting: Family Medicine

## 2012-06-25 ENCOUNTER — Ambulatory Visit (INDEPENDENT_AMBULATORY_CARE_PROVIDER_SITE_OTHER): Payer: Medicaid Other | Admitting: *Deleted

## 2012-06-25 DIAGNOSIS — Z111 Encounter for screening for respiratory tuberculosis: Secondary | ICD-10-CM

## 2012-06-25 NOTE — Telephone Encounter (Signed)
Patients dropped off physical form to be filled out for job.  Please call her when completed.

## 2012-06-27 ENCOUNTER — Ambulatory Visit (INDEPENDENT_AMBULATORY_CARE_PROVIDER_SITE_OTHER): Payer: Medicaid Other | Admitting: *Deleted

## 2012-06-27 DIAGNOSIS — Z111 Encounter for screening for respiratory tuberculosis: Secondary | ICD-10-CM

## 2012-06-27 LAB — TB SKIN TEST
Induration: 0 mm
TB Skin Test: NEGATIVE

## 2012-06-28 NOTE — Telephone Encounter (Signed)
Called and LMOVM for pt.  Most of the form has been completed, but we do not have her shot record.  If she has been coming here as a child we can get this from storage but will take a few days (she needs the form by 2.4.13)  Placed form up front for pt and will wait for her to tell us what she would like for [place] to do. [place], Maryjo [place]

## 2012-07-03 ENCOUNTER — Telehealth: Payer: Self-pay | Admitting: Psychology

## 2012-07-03 NOTE — Telephone Encounter (Signed)
Patient called to schedule beh med.  Scheduled for February 10th at 10:00.    I told her the following: -  If she is unable to make the appointment she needs to call me. -  If she misses the appointment without a phone call, I won't be able to schedule her back in my clinic. She voiced an understanding and was able to repeat the appointment date and time back to me.

## 2012-07-08 ENCOUNTER — Ambulatory Visit (INDEPENDENT_AMBULATORY_CARE_PROVIDER_SITE_OTHER): Payer: Medicaid Other | Admitting: Psychology

## 2012-07-08 DIAGNOSIS — F339 Major depressive disorder, recurrent, unspecified: Secondary | ICD-10-CM
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
## 2012-07-08 NOTE — Patient Instructions (Addendum)
Please schedule a follow-up for 2/18 at 11:00.  Call [phone] if you can't make it. Please identify two to three SPECIFIC goals for therapy and write down how you would know that you had achieved them.  Another way to think about this is to ask yourself, what would be different in my life or about me if I met this goal.

## 2012-07-08 NOTE — Assessment & Plan Note (Signed)
Will attempt to get paper record - her care last time predates [facility].  Asked her to get very specific in terms of her goals for therapy.  She was able to identify feeling more unguarded love for her daughters.  She would know a switch took place when she can be more genuinely affectionate with them.  See patient instructions for further plan.

## 2012-07-15 ENCOUNTER — Encounter: Payer: Self-pay | Admitting: Psychology

## 2012-07-15 NOTE — Progress Notes (Deleted)
Psychiatric Assessment Adult  Patient Identification:  BERDENE ASKARI Date of Evaluation:  07/15/2012 Chief Complaint: *** History of Chief Complaint:  No chief complaint on file.   HPI Review of Systems Physical Exam  Depressive Symptoms: {DEPRESSION SYMPTOMS:20000}  (Hypo) Manic Symptoms:   Elevated Mood:  {BHH YES OR NO:22294} Irritable Mood:  {BHH YES OR NO:22294} Grandiosity:  {BHH YES OR NO:22294} Distractibility:  {BHH YES OR NO:22294} Labiality of Mood:  {BHH YES OR NO:22294} Delusions:  {BHH YES OR NO:22294} Hallucinations:  {BHH YES OR NO:22294} Impulsivity:  {BHH YES OR NO:22294} Sexually Inappropriate Behavior:  {BHH YES OR NO:22294} Financial Extravagance:  {BHH YES OR NO:22294} Flight of Ideas:  {BHH YES OR NO:22294}  Anxiety Symptoms: Excessive Worry:  {BHH YES OR NO:22294} Panic Symptoms:  {BHH YES OR NO:22294} Agoraphobia:  {BHH YES OR NO:22294} Obsessive Compulsive: {BHH YES OR NO:22294}  Symptoms: {Obsessive Compulsive Symptoms:22671} Specific Phobias:  {BHH YES OR NO:22294} Social Anxiety:  {BHH YES OR NO:22294}  Psychotic Symptoms:  Hallucinations: {BHH YES OR NO:22294} {Hallucinations:22672} Delusions:  {BHH YES OR NO:22294} Paranoia:  {BHH YES OR NO:22294}   Ideas of Reference:  {BHH YES OR NO:22294}  PTSD Symptoms: Ever had a traumatic exposure:  {BHH YES OR NO:22294} Had a traumatic exposure in the last month:  {BHH YES OR NO:22294} Re-experiencing: {BHH YES OR NO:22294} {Re-experiencing:22673} Hypervigilance:  {BHH YES OR NO:22294} Hyperarousal: {BHH YES OR NO:22294} {Hyperarousal:22674} Avoidance: {BHH YES OR NO:22294} {Avoidance:22675}  Traumatic Brain Injury: {BHH YES OR NO:22294} {Traumatic Brain Injury:22676}  Past Psychiatric History: Diagnosis: ***  Hospitalizations: ***  Outpatient Care: ***  Substance Abuse Care: ***  Self-Mutilation: ***  Suicidal Attempts: ***  Violent Behaviors: ***   Past Medical History:   Past  Medical History  Diagnosis Date  . HYPERTHYROIDISM 07/20/2009  . DEPRESSION, MAJOR, RECURRENT 07/26/2006  . ANXIETY 07/26/2006  . ASTHMA, INTERMITTENT 07/26/2006  . HYPERGLYCEMIA 09/28/2009  . CONSTIPATION, CHRONIC 06/22/2010  . Dizziness 11/09/2010  . OBESITY 01/31/2008   History of Loss of Consciousness:  {BHH YES OR NO:22294} Seizure History:  {BHH YES OR NO:22294} Cardiac History:  {BHH YES OR NO:22294} Allergies:   Allergies  Allergen Reactions  . Penicillins    Current Medications:  Current Outpatient Prescriptions  Medication Sig Dispense Refill  . albuterol (PROVENTIL HFA;VENTOLIN HFA) 108 (90 BASE) MCG/ACT inhaler Inhale 2 puffs into the lungs every 6 (six) hours as needed for wheezing.  1 Inhaler  0  . erythromycin (E-MYCIN) 250 MG tablet Take 1 tablet (250 mg total) by mouth 4 (four) times daily.  28 tablet  0  . Glycerin, Adult, (COLACE ADULT) 2.1 G SUPP Place 1 suppository rectally daily as needed.  25 suppository  0   No current facility-administered medications for this visit.    Previous Psychotropic Medications:  Medication Dose   ***  ***                     Substance Abuse History in the last 12 months: Substance Age of 1st Use Last Use Amount Specific Type  Nicotine  ***  ***  ***  ***  Alcohol  ***  ***  ***  ***  Cannabis  ***  ***  ***  ***  Opiates  ***  ***  ***  ***  Cocaine  ***  ***  ***  ***  Methamphetamines  ***  ***  ***  ***  LSD  ***  ***  ***  ***  Ecstasy  ***   ***  ***  ***  Benzodiazepines  ***  ***  ***  ***  Caffeine  ***  ***  ***  ***  Inhalants  ***  ***  ***  ***  Others:                          Medical Consequences of Substance Abuse: ***  Legal Consequences of Substance Abuse: ***  Family Consequences of Substance Abuse: ***  Blackouts:  {BHH YES OR NO:22294} DT's:  {BHH YES OR NO:22294} Withdrawal Symptoms:  {BHH YES OR NO:22294} {Withdrawal Symptoms:22677}  Social History: Current Place of Residence:  *** Place of Birth: *** Family Members: *** Marital Status:  {Marital Status:22678} Children: ***  Sons: ***  Daughters: *** Relationships: *** Education:  {Education:22679} Educational Problems/Performance: *** Religious Beliefs/Practices: *** History of Abuse: {Desc; abuse:16542} Occupational Experiences; Military History:  {Military History:22680} Legal History: *** Hobbies/Interests: ***  Family History:   Family History  Problem Relation Age of Onset  . Hypertension Other     Mental Status Examination/Evaluation: Objective:  Appearance: {Appearance:22683}  Eye Contact::  {BHH EYE CONTACT:22684}  Speech:  {Speech:22685}  Volume:  {Volume (PAA):22686}  Mood:  ***  Affect:  {Affect (PAA):22687}  Thought Process:  {Thought Process (PAA):22688}  Orientation:  {BHH ORIENTATION (PAA):22689}  Thought Content:  {Thought Content:22690}  Suicidal Thoughts:  {ST/HT (PAA):22692}  Homicidal Thoughts:  {ST/HT (PAA):22692}  Judgement:  {Judgement (PAA):22694}  Insight:  {Insight (PAA):22695}  Psychomotor Activity:  {Psychomotor (PAA):22696}  Akathisia:  {BHH YES OR NO:22294}  Handed:  {Handed:22697}  AIMS (if indicated):  ***  Assets:  {Assets (PAA):22698}    Laboratory/X-Ray Psychological Evaluation(s)   ***  ***   Assessment:  {axis diagnosis:3049000}  AXIS I {psych axis 1:31909}  AXIS II {psych axis 2:31910}  AXIS III Past Medical History  Diagnosis Date  . HYPERTHYROIDISM 07/20/2009  . DEPRESSION, MAJOR, RECURRENT 07/26/2006  . ANXIETY 07/26/2006  . ASTHMA, INTERMITTENT 07/26/2006  . HYPERGLYCEMIA 09/28/2009  . CONSTIPATION, CHRONIC 06/22/2010  . Dizziness 11/09/2010  . OBESITY 01/31/2008     AXIS IV {psych axis iv:31915}  AXIS V {psych axis v score:31919}   Treatment Plan/Recommendations:  Plan of Care: ***  Laboratory:  {Laboratory:22682}  Psychotherapy: ***  Medications: ***  Routine PRN Medications:  {BHH YES OR GE:95284}  Consultations: ***  Safety  Concerns:  ***  OtherSpero Geralds, PsyD 2/17/201412:39 PM
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
## 2012-07-16 ENCOUNTER — Ambulatory Visit (INDEPENDENT_AMBULATORY_CARE_PROVIDER_SITE_OTHER): Payer: Medicaid Other | Admitting: Psychology

## 2012-07-16 DIAGNOSIS — F39 Unspecified mood [affective] disorder: Secondary | ICD-10-CM

## 2012-07-16 NOTE — Patient Instructions (Signed)
Please schedule a follow-up for:  February 25th at 11:00. Homework this week is to make a list of your regrets.  Start with the biggest and see how far you get.  Remember the words we talked about - guilt and shame and see which one seems to bubble up.  Shame might feel deeper - and you might have questions about your worth.  We will look at how to manage these regrets next visit. The other homework - is a little experiment with your girls.  Think about just a moment - it might be very fleeting where you are with them - noticing them or delighting in them and see if you are able to soften at all.
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
## 2012-07-17 ENCOUNTER — Ambulatory Visit: Payer: Medicaid Other | Admitting: Family Medicine

## 2012-07-23 ENCOUNTER — Ambulatory Visit (INDEPENDENT_AMBULATORY_CARE_PROVIDER_SITE_OTHER): Payer: Medicaid Other | Admitting: Psychology

## 2012-07-23 DIAGNOSIS — F39 Unspecified mood [affective] disorder: Secondary | ICD-10-CM
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
## 2012-07-23 NOTE — Patient Instructions (Signed)
Please schedule a follow-up for:  March 13th at 9:00. Continue the work with your girls.  Think about turning toward rather than turning away.  Even for a few seconds.  This will fill you up and will have a positive effect of them. Consider not judging your experience too much.  Notice it.  Figure out your next step - without judgment. I heard you say that you are not in a place to pursue too much with your boys right now.  I think it is wise to trust your judgment.
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
## 2012-08-05 ENCOUNTER — Encounter: Payer: Self-pay | Admitting: Family Medicine

## 2012-08-05 ENCOUNTER — Ambulatory Visit (INDEPENDENT_AMBULATORY_CARE_PROVIDER_SITE_OTHER): Payer: Medicaid Other | Admitting: Family Medicine

## 2012-08-05 VITALS — BP 124/74 | HR 88 | Temp 97.2°F | Ht 71.0 in | Wt 236.0 lb

## 2012-08-05 DIAGNOSIS — E669 Obesity, unspecified: Secondary | ICD-10-CM

## 2012-08-05 MED ORDER — ALBUTEROL SULFATE HFA 108 (90 BASE) MCG/ACT IN AERS: 2.0000 | INHALATION_SPRAY | Freq: Four times a day (QID) | RESPIRATORY_TRACT | Status: DC | PRN

## 2012-08-05 NOTE — Assessment & Plan Note (Signed)
Refilled albuterol 

## 2012-08-05 NOTE — Assessment & Plan Note (Signed)
Weight is improved.

## 2012-08-05 NOTE — Assessment & Plan Note (Signed)
Improved after seeing GI.

## 2012-08-05 NOTE — Patient Instructions (Signed)
Calorie Counting Diet A calorie counting diet requires you to eat the number of calories that are right for you in a day. Calories are the measurement of how much energy you get from the food you eat. Eating the right amount of calories is important for staying at a healthy weight. If you eat too many calories, your body will store them as fat and you may gain weight. If you eat too few calories, you may lose weight. Counting the number of calories you eat during a day will help you know if you are eating the right amount. A Registered Dietitian can determine how many calories you need in a day. The amount of calories needed varies from person to person. If your goal is to lose weight, you will need to eat fewer calories. Losing weight can benefit you if you are overweight or have health problems such as heart disease, high blood pressure, or diabetes. If your goal is to gain weight, you will need to eat more calories. Gaining weight may be necessary if you have a certain health problem that causes your body to need more energy. TIPS Whether you are increasing or decreasing the number of calories you eat during a day, it may be hard to get used to changes in what you eat and drink. The following are tips to help you keep track of the number of calories you eat.  Measure foods at home with measuring cups. This helps you know the amount of food and number of calories you are eating.  Restaurants often serve food in amounts that are larger than 1 serving. While eating out, estimate how many servings of a food you are given. For example, a serving of cooked rice is  cup or about the size of half of a fist. Knowing serving sizes will help you be aware of how much food you are eating at restaurants.  Ask for smaller portion sizes or child-size portions at restaurants.  Plan to eat half of a meal at a restaurant. Take the rest home or share the other half with a friend.  Read the Nutrition Facts panel on  food labels for calorie content and serving size. You can find out how many servings are in a package, the size of a serving, and the number of calories each serving has.  For example, a package might contain 3 cookies. The Nutrition Facts panel on that package says that 1 serving is 1 cookie. Below that, it will say there are 3 servings in the container. The calories section of the Nutrition Facts label says there are 90 calories. This means there are 90 calories in 1 cookie (1 serving). If you eat 1 cookie you have eaten 90 calories. If you eat all 3 cookies, you have eaten 270 calories (3 servings x 90 calories = 270 calories). The list below tells you how big or small some common portion sizes are.  1 oz.........4 stacked dice.  3 oz.........Deck of cards.  1 tsp........Tip of little finger.  1 tbs........Thumb.  2 tbs........Golf ball.   cup.......Half of a fist.  1 cup........A fist. KEEP A FOOD LOG Write down every food item you eat, the amount you eat, and the number of calories in each food you eat during the day. At the end of the day, you can add up the total number of calories you have eaten. It may help to keep a list like the one below. Find out the calorie information by reading the   Nutrition Facts panel on food labels. Breakfast  Bran cereal (1 cup, 110 calories).  Fat-free milk ( cup, 45 calories). Snack  Apple (1 medium, 80 calories). Lunch  Spinach (1 cup, 20 calories).  Tomato ( medium, 20 calories).  Chicken breast strips (3 oz, 165 calories).  Shredded cheddar cheese ( cup, 110 calories).  Light Italian dressing (2 tbs, 60 calories).  Whole-wheat bread (1 slice, 80 calories).  Tub margarine (1 tsp, 35 calories).  Vegetable soup (1 cup, 160 calories). Dinner  Pork chop (3 oz, 190 calories).  Brown rice (1 cup, 215 calories).  Steamed broccoli ( cup, 20 calories).  Strawberries (1  cup, 65 calories).  Whipped cream (1 tbs, 50  calories). Daily Calorie Total: 1425 Document Released: 05/15/2005 Document Revised: 08/07/2011 Document Reviewed: 11/09/2006 [employer] Patient Information 2013 [employer].  

## 2012-08-05 NOTE — Progress Notes (Signed)
  Subjective:    Patient ID: Monica Chase, female    DOB: September 17, 1975, 37 y.o.   MRN: [id number]  HPI  Here for f/u.. Is seeing counselor and is feeling like it is working well.  She reports walking and 18# weight loss.  She wants her lipids rechecked.  She also wants her TSH checked.  She is in a shelter but hopes to move into housing and a more permanent job soon.  Review of Systems  Constitutional: Negative for fever and chills.  Respiratory: Negative for chest tightness and shortness of breath.   Gastrointestinal: Negative for abdominal pain.  Genitourinary: Negative for dysuria.       Objective:   Physical Exam  Vitals reviewed. Constitutional: She appears well-developed and well-nourished.  HENT:  Head: Normocephalic and atraumatic.  Eyes: No scleral icterus.  Neck: Neck supple.  Cardiovascular: Normal rate.   Pulmonary/Chest: Effort normal.  Abdominal: Soft. There is no tenderness.          Assessment & Plan:

## 2012-08-05 NOTE — Assessment & Plan Note (Signed)
Continue counseling

## 2012-08-05 NOTE — Assessment & Plan Note (Signed)
Has been normal, will re-check levels.

## 2012-08-06 LAB — LIPID PANEL
Cholesterol: 184 mg/dL (ref 0–200)
HDL: 52 mg/dL (ref >39)
LDL Cholesterol: 118 mg/dL — ABNORMAL HIGH (ref 0–99)
Triglycerides: 68 mg/dL (ref <150)

## 2012-08-06 LAB — TSH: TSH: 0.759 u[IU]/mL (ref 0.350–4.500)

## 2012-08-07 ENCOUNTER — Encounter: Payer: Self-pay | Admitting: Family Medicine

## 2012-08-08 ENCOUNTER — Ambulatory Visit: Payer: Medicaid Other | Admitting: Psychology

## 2012-08-15 ENCOUNTER — Ambulatory Visit: Payer: Medicaid Other | Admitting: Psychology

## 2012-08-19 ENCOUNTER — Ambulatory Visit: Payer: Medicaid Other | Admitting: Family Medicine

## 2012-09-02 ENCOUNTER — Ambulatory Visit: Payer: Medicaid Other | Admitting: Psychology

## 2012-09-26 ENCOUNTER — Ambulatory Visit: Payer: Medicaid Other | Admitting: Family Medicine

## 2012-10-30 ENCOUNTER — Telehealth: Payer: Self-pay | Admitting: Psychology

## 2012-10-30 NOTE — Telephone Encounter (Signed)
Monica Chase left a VM to inquire whether I could come to the shelter where she is staying in order to do therapy sessions.  She says due to her work schedule, she can not attend therapy appointments.  Left a VM for her to tell her that unfortunately, that isn't possible.  Asked her to call me back to discuss further if she wishes.

## 2012-11-11 ENCOUNTER — Telehealth: Payer: Self-pay | Admitting: Psychology

## 2012-11-11 NOTE — Telephone Encounter (Signed)
Monica Chase and I connected by phone but were disconnected several times.  She eventually left a VM asking about therapy sessions.  I told her previously by voice mail and reiterated today by VM that I am unable to provide therapy sessions at her shelter.  She also asked about availability (I assume at the [facility]).  I don't have any availability until July and I left that on the VM.  Will await her follow-up.

## 2012-11-19 ENCOUNTER — Emergency Department (HOSPITAL_COMMUNITY)
Admission: EM | Admit: 2012-11-19 | Discharge: 2012-11-19 | Disposition: A | Discharge status: Home / Self Care | Payer: Medicaid Other | Source: Home / Self Care | Attending: Family Medicine | Admitting: Family Medicine

## 2012-11-19 ENCOUNTER — Encounter (HOSPITAL_COMMUNITY): Payer: Self-pay | Admitting: *Deleted

## 2012-11-19 DIAGNOSIS — IMO0001 Reserved for inherently not codable concepts without codable children: Secondary | ICD-10-CM

## 2012-11-19 DIAGNOSIS — S40861A Insect bite (nonvenomous) of right upper arm, initial encounter: Secondary | ICD-10-CM

## 2012-11-19 MED ORDER — FLUTICASONE PROPIONATE 0.05 % EX CREA: TOPICAL_CREAM | Freq: Two times a day (BID) | CUTANEOUS | Status: DC

## 2012-11-19 NOTE — ED Notes (Signed)
Pt  Has    Lesions  Typical of  Insect /  Mosquito     Bite    On  Arms       Symptoms  X  3  Days   No  Angioedema  -  Appears  In  No  Distress

## 2012-11-19 NOTE — ED Provider Notes (Signed)
   History    CSN: [id number] Arrival date & time 11/19/12  1614  First MD Initiated Contact with Patient 11/19/12 1656     Chief Complaint  Patient presents with  . Rash   (Consider location/radiation/quality/duration/timing/severity/associated sxs/prior Treatment) Patient is a 37 y.o. female presenting with rash. The history is provided by the patient.  Rash Duration:  3 days Progression:  Unchanged Context comment:  After working in aunt's yard  Associated symptoms: no fever    Past Medical History  Diagnosis Date  . HYPERTHYROIDISM 07/20/2009  . DEPRESSION, MAJOR, RECURRENT 07/26/2006  . ANXIETY 07/26/2006  . ASTHMA, INTERMITTENT 07/26/2006  . HYPERGLYCEMIA 09/28/2009  . CONSTIPATION, CHRONIC 06/22/2010  . Dizziness 11/09/2010  . OBESITY 01/31/2008   Past Surgical History  Procedure Laterality Date  . Tubal ligation  2008  . Iud removal  10/07    Mirena removed 05/10/2004   Family History  Problem Relation Age of Onset  . Hypertension Other    History  Substance Use Topics  . Smoking status: Former Smoker    Quit date: 11/08/2005  . Smokeless tobacco: Never Used  . Alcohol Use: No   OB History   Grav Para Term Preterm Abortions TAB SAB Ect Mult Living                 Review of Systems  Constitutional: Negative.  Negative for fever.  Musculoskeletal: Negative.   Skin: Positive for rash.    Allergies  Penicillins  Home Medications   Current Outpatient Rx  Name  Route  Sig  Dispense  Refill  . albuterol (PROVENTIL HFA;VENTOLIN HFA) 108 (90 BASE) MCG/ACT inhaler   Inhalation   Inhale 2 puffs into the lungs every 6 (six) hours as needed for wheezing.   1 Inhaler   0   . erythromycin (E-MYCIN) 250 MG tablet   Oral   Take 1 tablet (250 mg total) by mouth 4 (four) times daily.   28 tablet   0   . fluticasone (CUTIVATE) 0.05 % cream   Topical   Apply topically 2 (two) times daily.   15 g   0   . Glycerin, Adult, (COLACE ADULT) 2.1 G SUPP   Rectal  Place 1 suppository rectally daily as needed.   25 suppository   0    BP 127/74  Pulse 71  Temp(Src) 98.1 F (36.7 C) (Oral)  Resp 16  SpO2 100%  LMP 11/19/2012 Physical Exam  Nursing note and vitals reviewed. Constitutional: She appears well-developed and well-nourished.  Skin: Skin is warm and dry. Rash noted.  Crusting papular lesion on right forearm and left shoulder, pruritic, nonpustular    ED Course  Procedures (including critical care time) Labs Reviewed - No data to display No results found. 1. Insect bite of arm, right, initial encounter     MDM    Linna Hoff, MD 11/19/12 [phone]

## 2012-11-25 ENCOUNTER — Ambulatory Visit: Payer: Medicaid Other

## 2012-11-25 ENCOUNTER — Ambulatory Visit (INDEPENDENT_AMBULATORY_CARE_PROVIDER_SITE_OTHER): Payer: Medicaid Other | Admitting: Family Medicine

## 2012-11-25 ENCOUNTER — Encounter: Payer: Self-pay | Admitting: Family Medicine

## 2012-11-25 VITALS — BP 114/76 | HR 76 | Temp 98.2°F | Ht 71.0 in | Wt 230.9 lb

## 2012-11-25 DIAGNOSIS — R05 Cough: Secondary | ICD-10-CM

## 2012-11-25 DIAGNOSIS — J309 Allergic rhinitis, unspecified: Secondary | ICD-10-CM

## 2012-11-25 DIAGNOSIS — R21 Rash and other nonspecific skin eruption: Secondary | ICD-10-CM

## 2012-11-25 MED ORDER — HYDROCODONE-HOMATROPINE 5-1.5 MG/5ML PO SYRP: 5.0000 mL | ORAL_SOLUTION | Freq: Three times a day (TID) | ORAL | Status: DC | PRN

## 2012-11-25 MED ORDER — FLUTICASONE PROPIONATE 50 MCG/ACT NA SUSP: 2.0000 | Freq: Every day | NASAL | Status: DC

## 2012-11-25 NOTE — Progress Notes (Signed)
  Subjective:    Patient ID: Monica Chase, female    DOB: Aug 04, 1975, 37 y.o.   MRN: [id number]  HPI  37 year old F with homelessness liviing in a shelter who presents with a rash.    Rash - Insolated bumps spread over body, started on right upper arm and was told that it was an insect bite but then spread to left arm and lower legs, tried bleach but not effective, now itches a lot; Aunt has similar rash - but they do not live together, patient did help clean her aunt's yard; Lives in [place], notes men's side has bed bugs outbreak but never her side, also has 2 daughters (9 and 2) that live there and don't have a rash  Cough - 2 weeks duration, productive for mucus, worsening, has tried Mucinex and cough drops, has body aches accompanying this, no objective fever but positive for chills, frequently has runny nose; patient had negative Tb skin test last year as part of employment physical  Review of Systems See HPI     Objective:   Physical Exam  BP 114/76  Pulse 76  Temp(Src) 98.2 F (36.8 C) (Oral)  Ht 5\' 11"  (1.803 m)  Wt 230 lb 14.4 oz (104.736 kg)  BMI 32.22 kg/m2  LMP 11/19/2012  Gen: middle aged AAF, mildly ill appearing, pleasant and conversant Nose: bilateral clear rhinorrhea, boggy nasal turbinates Face: no sinus tenderness  Skin: isolated right upper extremity, left upper extremity, right lower abdomen and right upper abdomen      Assessment & Plan:

## 2012-11-25 NOTE — Patient Instructions (Addendum)
Dear Ms. Hannibal,   I think you do have insect bites that will get better, but will continue to itch as they heal. Use benadryl for anti-itching as needed. I also think you have a viral illness and allergic rhinitis causing the cough. The virus will get better, but the cough may persist. Please use the cough at night and the nasal spray should help as well.   Please follow up 1-2 weeks.   Sincerely,   Dr. Clinton Sawyer

## 2012-11-26 DIAGNOSIS — R05 Cough: Secondary | ICD-10-CM | POA: Insufficient documentation

## 2012-11-26 DIAGNOSIS — R21 Rash and other nonspecific skin eruption: Secondary | ICD-10-CM | POA: Insufficient documentation

## 2012-11-26 NOTE — Assessment & Plan Note (Signed)
The appearance of the rash is consistent with insect bites. I do have concern for bed bugs since she lives in a homeless shelter. However, her daughters live with her and don't have similar issues. She does have a relative with contact dermatitis, which it may be as well. Regardless, the lesions appear to be healing and are not infected. It should improve without intervention, so I recommended anti-histamine for itching.

## 2012-11-26 NOTE — Assessment & Plan Note (Signed)
This is likely combination of post-viral inflammation and allergic rhinitis. She has clinical evidence of allergic rhinitis with worse cough at night, which is consistent. Therefore, I will treat with fluticasone nasal spray to see if it improves. Less likely, but still possible given homelessness, is tuberculosis vs atypical pulm infection. Have patient f/u in 1-2 weeks.

## 2012-12-09 ENCOUNTER — Encounter: Payer: Self-pay | Admitting: Family Medicine

## 2012-12-09 ENCOUNTER — Ambulatory Visit (INDEPENDENT_AMBULATORY_CARE_PROVIDER_SITE_OTHER): Payer: Medicaid Other | Admitting: Family Medicine

## 2012-12-09 VITALS — BP 120/77 | HR 72 | Ht 71.0 in | Wt 232.7 lb

## 2012-12-09 DIAGNOSIS — H9193 Unspecified hearing loss, bilateral: Secondary | ICD-10-CM

## 2012-12-09 DIAGNOSIS — H9190 Unspecified hearing loss, unspecified ear: Secondary | ICD-10-CM

## 2012-12-09 NOTE — Patient Instructions (Addendum)
Thank you for coming in today. I'm glad that you rash is improving. Also, you passed your hearing screening and your ears look great. There is nothing that we need to do right now for your hearing, but please continue to keep note of your problems with hearing.   Please follow up with Dr. Shawnie Pons for a pap smear.   Sincerely,   Dr. Clinton Sawyer

## 2012-12-09 NOTE — Progress Notes (Signed)
  Subjective:    Patient ID: Monica Chase, female    DOB: May 06, 1976, 37 y.o.   MRN: [id number]  HPI  37 year old F who presents for a rash check and hearing issues.   Rash: Evaluated on 6/30 and thought to have contact dermatitis vs insect bite with healing. Was instructed to use anti-histamine cream. It has improved. It is no longer itching. No new bumps or rash. No fever or chills   Hearing Problem: 3-4 years duration Difficulty hearing people from a few few away Worse in a crowded area and but also occurs with individuals in person and on phone Associated symptoms: intermittent tinnitus  Denies pain, surgeries, recent infections Hx of numerous ear infection in her 20s    Review of Systems Otherwise negative      Objective:   Physical Exam BP 120/77  Pulse 72  Ht 5\' 11"  (1.803 m)  Wt 232 lb 11.2 oz (105.552 kg)  BMI 32.47 kg/m2  LMP 11/19/2012 Gen: well appearing female, non distressed HEENT: Ear - no otalgia, clear external ear canal bilaterally, tympanic membrane without effusions; OP clear, tonsils without erythema or exudate Skin: no active rash, healed papule with hyperpigmented macule on on right upper extremity  Hearing Screening: Normal hearing screening bilaterally      Assessment & Plan:   1. Rash due to insect bites - healed 2. Subjective Hearing Difficulty - No objective evidence of hearing difficulty on testing and no concerning findings on exam for problem. Continue to monitor at this point.

## 2013-02-24 ENCOUNTER — Other Ambulatory Visit: Payer: Self-pay | Admitting: Family Medicine

## 2013-02-24 DIAGNOSIS — E7800 Pure hypercholesterolemia, unspecified: Secondary | ICD-10-CM

## 2013-04-09 ENCOUNTER — Encounter (HOSPITAL_COMMUNITY): Payer: Self-pay | Admitting: *Deleted

## 2013-04-09 ENCOUNTER — Encounter: Payer: Self-pay | Admitting: Family Medicine

## 2013-04-09 ENCOUNTER — Inpatient Hospital Stay (HOSPITAL_COMMUNITY): Payer: Medicaid Other

## 2013-04-09 ENCOUNTER — Inpatient Hospital Stay (HOSPITAL_COMMUNITY)
Admission: AD | Admit: 2013-04-09 | Discharge: 2013-04-09 | Disposition: A | Discharge status: Home / Self Care | Payer: Medicaid Other | Source: Ambulatory Visit | Attending: Obstetrics & Gynecology | Admitting: Obstetrics & Gynecology

## 2013-04-09 ENCOUNTER — Ambulatory Visit (INDEPENDENT_AMBULATORY_CARE_PROVIDER_SITE_OTHER): Payer: Medicaid Other | Admitting: Family Medicine

## 2013-04-09 VITALS — BP 114/74 | HR 60 | Ht 71.0 in | Wt 230.0 lb

## 2013-04-09 DIAGNOSIS — R1032 Left lower quadrant pain: Secondary | ICD-10-CM

## 2013-04-09 DIAGNOSIS — D219 Benign neoplasm of connective and other soft tissue, unspecified: Secondary | ICD-10-CM

## 2013-04-09 DIAGNOSIS — Z87891 Personal history of nicotine dependence: Secondary | ICD-10-CM | POA: Insufficient documentation

## 2013-04-09 DIAGNOSIS — D259 Leiomyoma of uterus, unspecified: Secondary | ICD-10-CM

## 2013-04-09 DIAGNOSIS — E0590 Thyrotoxicosis, unspecified without thyrotoxic crisis or storm: Secondary | ICD-10-CM

## 2013-04-09 HISTORY — DX: Benign neoplasm of connective and other soft tissue, unspecified: D21.9

## 2013-04-09 LAB — URINALYSIS, ROUTINE W REFLEX MICROSCOPIC
Bilirubin Urine: NEGATIVE
Hgb urine dipstick: NEGATIVE
Ketones, ur: NEGATIVE mg/dL
Nitrite: NEGATIVE
Specific Gravity, Urine: 1.015 (ref 1.005–1.030)
pH: 8.5 — ABNORMAL HIGH (ref 5.0–8.0)

## 2013-04-09 LAB — WET PREP, GENITAL
Clue Cells Wet Prep HPF POC: NONE SEEN
Trich, Wet Prep: NONE SEEN
Yeast Wet Prep HPF POC: NONE SEEN

## 2013-04-09 LAB — POCT PREGNANCY, URINE: Preg Test, Ur: NEGATIVE

## 2013-04-09 MED ORDER — TRAMADOL HCL 50 MG PO TABS
50.0000 mg | ORAL_TABLET | Freq: Once | ORAL | Status: AC
  Administered 2013-04-09: 50 mg via ORAL
  Filled 2013-04-09: qty 1

## 2013-04-09 MED ORDER — OXYCODONE-ACETAMINOPHEN 5-325 MG PO TABS: 1.0000 | ORAL_TABLET | ORAL | Status: DC | PRN

## 2013-04-09 MED ORDER — POLYETHYLENE GLYCOL 3350 17 GM/SCOOP PO POWD: 17.0000 g | Freq: Two times a day (BID) | ORAL | Status: DC | PRN

## 2013-04-09 MED ORDER — IBUPROFEN 600 MG PO TABS: 600.0000 mg | ORAL_TABLET | Freq: Three times a day (TID) | ORAL | Status: DC | PRN

## 2013-04-09 NOTE — Patient Instructions (Addendum)
Take miralax until having regular bowel movments Use motrin first 600-800mg  three times daily for pain. Use percocet if not having pain control with motrin.  Abdominal Pain Abdominal pain can be caused by many things. Your caregiver decides the seriousness of your pain by an examination and possibly blood tests and X-rays. Many cases can be observed and treated at home. Most abdominal pain is not caused by a disease and will probably improve without treatment. However, in many cases, more time must pass before a clear cause of the pain can be found. Before that point, it may not be known if you need more testing, or if hospitalization or surgery is needed. HOME CARE INSTRUCTIONS   Do not take laxatives unless directed by your caregiver.  Take pain medicine only as directed by your caregiver.  Only take over-the-counter or prescription medicines for pain, discomfort, or fever as directed by your caregiver.  Try a clear liquid diet (broth, tea, or water) for as long as directed by your caregiver. Slowly move to a bland diet as tolerated. SEEK IMMEDIATE MEDICAL CARE IF:   The pain does not go away.  You have a fever.  You keep throwing up (vomiting).  The pain is felt only in portions of the abdomen. Pain in the right side could possibly be appendicitis. In an adult, pain in the left lower portion of the abdomen could be colitis or diverticulitis.  You pass bloody or black tarry stools. MAKE SURE YOU:   Understand these instructions.  Will watch your condition.  Will get help right away if you are not doing well or get worse. Document Released: 02/22/2005 Document Revised: 08/07/2011 Document Reviewed: 01/01/2008 [facility] Patient Information 2014 [place], [place].

## 2013-04-09 NOTE — [facility] Provider Note (Addendum)
I examined pt and agree with documentation above and PA-S plan of care.  Will schedule a follow-up appointment in GYN clinic for management of fibroid.  [facility]

## 2013-04-09 NOTE — Progress Notes (Signed)
Subjective:     Patient ID: Monica Chase, female   DOB: 27-Dec-1975, 37 y.o.   MRN: [id number]  HPI 37 y.o. F here w/ severe left lower quadrant pain that began after her last period last Monday 5Nov. It was sharp pain that resolved after 2 days. Then over the next 5 days progressively worsened. Pt has been in severe pain since. Aspirin 325 with no improvement. Pt took tramadol and had some improvement while flat. Pt did take an enema on Monday with good response. Had hard today.   Pt seen in [facility]U and diagnosed with increased fibroid from prior image. Pt was tx with ultram in ED and had some improvement of pain, that has since worn off. UA neg, wet mount neg, GC/C pending.   Review of Systems + Nausea/ no emesis. Severe abdominal pain, decreased appetite from pain. Moderate constipation, no fevers/chills, no SOB, no CP, no HA.      Objective:   Physical Exam Filed Vitals:   04/09/13 1458  BP: 114/74  Pulse: 60  VSS. Moderate distress from pain RRR no mgt CTAB no wrc Normal bowel sounds Signficantly tender in LLQ with moderate rebound, neg percussion. Pt leaning to take stress off site. No suprapubic pain No c/c/e  Results for Monica Chase (MRN [id number]) as of 04/09/2013 15:28  Ref. Range 04/09/2013 09:00 04/09/2013 09:40 04/09/2013 10:01  Preg Test, Ur Latest Range: NEGATIVE   NEGATIVE   Yeast Wet Prep HPF POC Latest Range: NONE SEEN    NONE SEEN  Trich, Wet Prep Latest Range: NONE SEEN    NONE SEEN  Clue Cells Wet Prep HPF POC Latest Range: NONE SEEN    NONE SEEN  WBC, Wet Prep HPF POC Latest Range: NONE SEEN    FEW (A)  Color, Urine Latest Range: YELLOW  YELLOW    APPearance Latest Range: CLEAR  CLEAR    Specific Gravity, Urine Latest Range: 1.005-1.030  1.015    pH Latest Range: 5.0-8.0  8.5 (H)    Glucose Latest Range: NEGATIVE mg/dL NEGATIVE    Bilirubin Urine Latest Range: NEGATIVE  NEGATIVE    Ketones, ur Latest Range: NEGATIVE mg/dL NEGATIVE    Protein Latest  Range: NEGATIVE mg/dL NEGATIVE    Urobilinogen, UA Latest Range: 0.0-1.0 mg/dL 0.2    Nitrite Latest Range: NEGATIVE  NEGATIVE    Leukocytes, UA Latest Range: NEGATIVE  NEGATIVE    Hgb urine dipstick Latest Range: NEGATIVE  NEGATIVE     CLINICAL DATA: Pelvic pain  EXAM:  TRANSABDOMINAL AND TRANSVAGINAL ULTRASOUND OF PELVIS  TECHNIQUE:  Both transabdominal and transvaginal ultrasound examinations of the  pelvis were performed. Transabdominal technique was performed for  global imaging of the pelvis including uterus, ovaries, adnexal  regions, and pelvic cul-de-sac. It was necessary to proceed with  endovaginal exam following the transabdominal exam to visualize the  right ovary.  COMPARISON: 03/01/2009  FINDINGS:  Uterus  Measurements: 14.0 x 8.5 x 14.0 cm. Large anterior fundal fibroid  measuring up to 10 cm. This previously measured 2 cm.  Endometrium  Thickness: Partially obscured by shadowing fibroid. Visualized  portions are borderline thickened at 17 mm. No focal abnormality  visualized.  Right ovary  Measurements: 4.1 x 1.9 x 1.6 cm. Normal appearance/no adnexal mass.  Left ovary  Measurements: 4.3 x 2.5 x 2.8 cm. Normal appearance/no adnexal mass.  Other findings  No free fluid.  IMPRESSION:  Large anterior fundal fibroid measuring up to 10 cm. This has  changed significantly since 2010 when this measured 2 cm maximally.  Electronically Signed  By: Charlett Nose M.D.  On: 04/09/2013 11:53  On review of images appears to have flow bilaterally and to have a solitary cyst on left ovary. Static image unable to make diagnosis but may be related to pain.     Assessment:     37 y.o. f presents with LLQ pain.  Strongly doubt torsion, kidney stone, diverticulitis possible but unlikely based on age and afebrile. May be related to constipation or cyst on ovary appreciated on [place].    Plan:     Pain control: Motrin 600-800mg  w/ food TID, percocet PRN Bowel regimen: start on  miralax till having soft stools. Reevaluate in 1 week for resolution If worsening pain recommend go to ED for advanced imaging to eval for diverticulitis or other eitiologies. [facility]

## 2013-04-09 NOTE — MAU Provider Note (Signed)
History     CSN: [id number]  Arrival date and time: 04/09/13 0848   None     Chief Complaint  Patient presents with  . Abdominal Pain   HPI  [name] is a 37yo [id number] who presents to the MAU today c/o LLQ pain x 1 week that has worsened in the past few days.  She reports a h/o endometriosis and L ovarian cyst, and has not received routine gynecological care since 2009.  U/S from 2010 revealed a small 2cm fibroid. She states that she does not like medications and does not like going to see the doctor.  Her PCP is Dr. Shawnie Pons at [facility], who manages her medical conditions.  She has an appointment at [facility] scheduled for today at 3pm, but she sates that she could not wait for that appointment to be seen because her pain was so unbearable.  She is not sexually active, and is not taking any OCPs.  LMP was 10/31.  UPT (-). She has NKDA other than PCNs.   [name] reports that she finished her menses on Monday, which had a longer duration and heavier flow than she is used to.  Her flow lasted for 7 days instead of her usual 2-3 days.  When her d/c had resolved, she states that she began to notice LLQ pain.  It was tolerable, but felt similar to the pain she had with her ovarian cyst, so she scheduled an appointment to be seen at [facility].  She states that the pain worsened over the week and became unbearable last night.  A friend of hers gave her some tramadol (dose unknown) to try for pain last night (11/11).  It did make the pain tolerable, and she was able to sleep.  However, upon awakening this morning the unbearable pain returned.  It is reported as a 10/10, constant, and sharp/shooting.  It radiates to her L lower back, and intermittently sends shooting pains down the back of her L leg.  The pain is worse with movement and ambulation. She did not take another tramadol this morning and denies trying any other medications.  She states that she is feeling nauseated due to the pain, but she had not  vomited.  She denies any vaginal d/c or spotting since menstruating, and her last BM was this morning. She also denies HA, CP, SOB, diarrhea, constipation, flank pain, dysuria, burning with urination, vaginal itching, LE swelling or pain, fever or chills.   Pertinent Gynecological History: Menses: regular every 28 days without intermenstrual spotting Bleeding: moderate flow for 2-3 days Contraception: abstinence DES exposure: unknown Blood transfusions: none Sexually transmitted diseases: no past history Previous GYN Procedures: unkown  Last mammogram: none Date: N/A Last pap: normal Date: 2009   Past Medical History  Diagnosis Date  . HYPERTHYROIDISM 07/20/2009  . DEPRESSION, MAJOR, RECURRENT 07/26/2006  . ANXIETY 07/26/2006  . ASTHMA, INTERMITTENT 07/26/2006  . HYPERGLYCEMIA 09/28/2009  . CONSTIPATION, CHRONIC 06/22/2010  . Dizziness 11/09/2010  . OBESITY 01/31/2008    Past Surgical History  Procedure Laterality Date  . Tubal ligation  2008  . Iud removal  10/07    Mirena removed 05/10/2004  . Pilonidal cyst excision    . Vaginal delivery      X 4    Family History  Problem Relation Age of Onset  . Hypertension Other     History  Substance Use Topics  . Smoking status: Former Smoker    Quit date: 11/08/2005  . Smokeless tobacco:  Never Used  . Alcohol Use: No    Allergies:  Allergies  Allergen Reactions  . Penicillins Other (See Comments)    Reaction unknown    Prescriptions prior to admission  Medication Sig Dispense Refill  . IRON PO Take 1 tablet by mouth daily.      . Prenatal Vit-Fe Fumarate-FA (PRENATAL MULTIVITAMIN) TABS tablet Take 1 tablet by mouth daily at 12 noon.      . vitamin B-12 (CYANOCOBALAMIN) 1000 MCG tablet Take 1,000 mcg by mouth daily.      Marland Kitchen albuterol (PROVENTIL HFA;VENTOLIN HFA) 108 (90 BASE) MCG/ACT inhaler Inhale 2 puffs into the lungs every 6 (six) hours as needed for wheezing.  1 Inhaler  0    Review of Systems  Constitutional:  Negative for fever and chills.  Eyes: Negative for blurred vision.  Respiratory: Negative for shortness of breath.   Cardiovascular: Negative for chest pain and leg swelling.  Gastrointestinal: Positive for nausea, abdominal pain and constipation. Negative for heartburn, vomiting, diarrhea and blood in stool.       LLQ pain. Last BM 11/12 am   Genitourinary: Negative for dysuria and frequency.  Musculoskeletal: Positive for back pain.  Neurological: Negative for headaches.   Physical Exam   Blood pressure 126/76, pulse 69, temperature 98.2 F (36.8 C), temperature source Oral, resp. rate 18, height 5\' 10"  (1.778 m), weight 104.055 kg (229 lb 6.4 oz), last menstrual period 03/28/2013, SpO2 99.00%.  Physical Exam  Constitutional: She is oriented to person, place, and time. She appears well-developed and well-nourished. She appears distressed.  Pt appears to be in pain. Visible tears upon exam.   HENT:  Head: Normocephalic and atraumatic.  Eyes: EOM are normal.  Cardiovascular: Normal rate, regular rhythm, normal heart sounds and intact distal pulses.   No murmur heard. Respiratory: Effort normal and breath sounds normal. She has no wheezes.  GI: Soft. Bowel sounds are normal. She exhibits no distension and no mass. There is tenderness in the left lower quadrant. There is guarding. There is no rebound and no CVA tenderness.    Pt very tender at LLQ- grabs at practitioners hand; guarding for exam.  Genitourinary: Vaginal discharge found.  Pelvic exam: VULVA: normal appearing vulva with no masses, tenderness or lesions, VAGINA: vaginal discharge - white, copious, curd-like, odorless and thick, CERVIX: cervical discharge present - white, copious, curd-like, odorless and thick, DNA probe for chlamydia and GC obtained, cervical motion tenderness absent, multiparous os, UTERUS: uterus is normal size, shape, consistency and nontender, ADNEXA: tenderness both, L>>R, WET MOUNT done - results: few  WBCs, no trich, no yeast, no clue cells.  Musculoskeletal: Normal range of motion. She exhibits no edema and no tenderness.  Neurological: She is alert and oriented to person, place, and time.  Skin: Skin is warm.  Psychiatric: She has a normal mood and affect.   Results for orders placed during the hospital encounter of 04/09/13 (from the past 24 hour(s))  URINALYSIS, ROUTINE W REFLEX MICROSCOPIC     Status: Abnormal   Collection Time    04/09/13  9:00 AM      Result Value Range   Color, Urine YELLOW  YELLOW   APPearance CLEAR  CLEAR   Specific Gravity, Urine 1.015  1.005 - 1.030   pH 8.5 (*) 5.0 - 8.0   Glucose, UA NEGATIVE  NEGATIVE mg/dL   Hgb urine dipstick NEGATIVE  NEGATIVE   Bilirubin Urine NEGATIVE  NEGATIVE   Ketones, ur NEGATIVE  NEGATIVE mg/dL  Protein, ur NEGATIVE  NEGATIVE mg/dL   Urobilinogen, UA 0.2  0.0 - 1.0 mg/dL   Nitrite NEGATIVE  NEGATIVE   Leukocytes, UA NEGATIVE  NEGATIVE  POCT PREGNANCY, URINE     Status: None   Collection Time    04/09/13  9:40 AM      Result Value Range   Preg Test, Ur NEGATIVE  NEGATIVE  WET PREP, GENITAL     Status: Abnormal   Collection Time    04/09/13 10:01 AM      Result Value Range   Yeast Wet Prep HPF POC NONE SEEN  NONE SEEN   Trich, Wet Prep NONE SEEN  NONE SEEN   Clue Cells Wet Prep HPF POC NONE SEEN  NONE SEEN   WBC, Wet Prep HPF POC FEW (*) NONE SEEN   US Transvaginal Non-ob  04/09/2013   CLINICAL DATA:  Pelvic pain  EXAM: TRANSABDOMINAL AND TRANSVAGINAL ULTRASOUND OF PELVIS  TECHNIQUE: Both transabdominal and transvaginal ultrasound examinations of the pelvis were performed. Transabdominal technique was performed for global imaging of the pelvis including uterus, ovaries, adnexal regions, and pelvic cul-de-sac. It was necessary to proceed with endovaginal exam following the transabdominal exam to visualize the right ovary.  COMPARISON:  03/01/2009  FINDINGS: Uterus  Measurements: 14.0 x 8.5 x 14.0 cm. Large anterior  fundal fibroid measuring up to 10 cm. This previously measured 2 cm.  Endometrium  Thickness: Partially obscured by shadowing fibroid. Visualized portions are borderline thickened at 17 mm. No focal abnormality visualized.  Right ovary  Measurements: 4.1 x 1.9 x 1.6 cm. Normal appearance/no adnexal mass.  Left ovary  Measurements: 4.3 x 2.5 x 2.8 cm. Normal appearance/no adnexal mass.  Other findings  No free fluid.  IMPRESSION: Large anterior fundal fibroid measuring up to 10 cm. This has changed significantly since 2010 when this measured 2 cm maximally.   Electronically Signed   By: Charlett Nose M.D.   On: 04/09/2013 11:53   US Pelvis Complete  04/09/2013   CLINICAL DATA:  Pelvic pain  EXAM: TRANSABDOMINAL AND TRANSVAGINAL ULTRASOUND OF PELVIS  TECHNIQUE: Both transabdominal and transvaginal ultrasound examinations of the pelvis were performed. Transabdominal technique was performed for global imaging of the pelvis including uterus, ovaries, adnexal regions, and pelvic cul-de-sac. It was necessary to proceed with endovaginal exam following the transabdominal exam to visualize the right ovary.  COMPARISON:  03/01/2009  FINDINGS: Uterus  Measurements: 14.0 x 8.5 x 14.0 cm. Large anterior fundal fibroid measuring up to 10 cm. This previously measured 2 cm.  Endometrium  Thickness: Partially obscured by shadowing fibroid. Visualized portions are borderline thickened at 17 mm. No focal abnormality visualized.  Right ovary  Measurements: 4.1 x 1.9 x 1.6 cm. Normal appearance/no adnexal mass.  Left ovary  Measurements: 4.3 x 2.5 x 2.8 cm. Normal appearance/no adnexal mass.  Other findings  No free fluid.  IMPRESSION: Large anterior fundal fibroid measuring up to 10 cm. This has changed significantly since 2010 when this measured 2 cm maximally.   Electronically Signed   By: Charlett Nose M.D.   On: 04/09/2013 11:53    MAU Course  Procedures  MDM -UA: nml -UPT: (-) -H&P -Vaginal exam with speculum: wet prep  and GC/Chlamydia  -Wet Prep: few WBCs seen, no yeast, trich, or clue cells  -Tramadol 50mg  PO x1 dose given  -Transvaginal, pelvic U/S: large anterior fundal fibroid measuring up to 10cm -Pt pain much improved, now 5/10; discussed results with pt- she is agreeable  to f/u with her appointment at [facility] today at 3pm -D/C pt to home  Assessment and Plan  A: -Uterine fibroid: 10cm, anterior fundal per U/S  P: -F/U with already scheduled appointment at [facility] today at 3pm -Return to MAU with any worsening or new symptoms   Christiana Fuchs 04/09/2013, 11:23 AM

## 2013-04-09 NOTE — MAU Note (Signed)
Patient states she has been having left lower abdominal pain for a few days, worse today. Denies bleeding.

## 2013-04-09 NOTE — MAU Note (Signed)
States she has not had a PAP smear since 2009. Has been seen at [facility] for physicals, but declines PAP smears/pelvic exam. Informed patient that she will not have a PAP smear in MAU. States her doctor is Dr. Shawnie Pons. States she has an appointment this afternoon @ [facility], but could not wait.

## 2013-04-10 ENCOUNTER — Telehealth: Payer: Self-pay | Admitting: Family Medicine

## 2013-04-10 LAB — GC/CHLAMYDIA PROBE AMP: CT Probe RNA: NEGATIVE

## 2013-04-10 NOTE — Telephone Encounter (Signed)
Pt called and would like more pain medication. She said the percocet is not strong enough and wants something called into the pharmacy. JW

## 2013-04-11 NOTE — Telephone Encounter (Signed)
appt made Monica Chase  

## 2013-04-11 NOTE — Telephone Encounter (Signed)
Reports a lot of pelvic pain and pressure in her bottom.  She is only getting relief for 2 days. Trial of combination Percocet and Ibuprofen.  Advised if this is degenerating fibroid, normal progression, possibility of diverticulitis also discussed, seems less likely.  Images reviewed, ovaries appear Nml.  If pain is unbearable, will need to come in and be seen.  She will attempt to make appt. Next week.

## 2013-04-16 ENCOUNTER — Ambulatory Visit (INDEPENDENT_AMBULATORY_CARE_PROVIDER_SITE_OTHER): Payer: Medicaid Other | Admitting: Family Medicine

## 2013-04-16 ENCOUNTER — Ambulatory Visit: Payer: Medicaid Other | Admitting: Family Medicine

## 2013-04-16 ENCOUNTER — Encounter: Payer: Self-pay | Admitting: Family Medicine

## 2013-04-16 ENCOUNTER — Other Ambulatory Visit (HOSPITAL_COMMUNITY)
Admission: RE | Admit: 2013-04-16 | Discharge: 2013-04-16 | Disposition: A | Discharge status: Home / Self Care | Payer: Medicaid Other | Source: Ambulatory Visit | Attending: Family Medicine | Admitting: Family Medicine

## 2013-04-16 VITALS — BP 126/81 | HR 71 | Temp 98.8°F | Ht 70.0 in | Wt 228.0 lb

## 2013-04-16 DIAGNOSIS — Z01419 Encounter for gynecological examination (general) (routine) without abnormal findings: Secondary | ICD-10-CM | POA: Insufficient documentation

## 2013-04-16 DIAGNOSIS — K649 Unspecified hemorrhoids: Secondary | ICD-10-CM | POA: Insufficient documentation

## 2013-04-16 DIAGNOSIS — Z1151 Encounter for screening for human papillomavirus (HPV): Secondary | ICD-10-CM | POA: Insufficient documentation

## 2013-04-16 DIAGNOSIS — D259 Leiomyoma of uterus, unspecified: Secondary | ICD-10-CM

## 2013-04-16 DIAGNOSIS — R1032 Left lower quadrant pain: Secondary | ICD-10-CM

## 2013-04-16 MED ORDER — OXYCODONE-ACETAMINOPHEN 5-325 MG PO TABS: 1.0000 | ORAL_TABLET | ORAL | Status: DC | PRN

## 2013-04-16 MED ORDER — PRAMOXINE HCL 1 % RE FOAM: 1.0000 "application " | Freq: Three times a day (TID) | RECTAL | Status: DC | PRN

## 2013-04-16 NOTE — Patient Instructions (Addendum)
Hemorrhoids Hemorrhoids are swollen veins around the rectum or anus. There are two types of hemorrhoids:   Internal hemorrhoids. These occur in the veins just inside the rectum. They may poke through to the outside and become irritated and painful.  External hemorrhoids. These occur in the veins outside the anus and can be felt as a painful swelling or hard lump near the anus. CAUSES  Pregnancy.   Obesity.   Constipation or diarrhea.   Straining to have a bowel movement.   Sitting for long periods on the toilet.  Heavy lifting or other activity that caused you to strain.  Anal intercourse. SYMPTOMS   Pain.   Anal itching or irritation.   Rectal bleeding.   Fecal leakage.   Anal swelling.   One or more lumps around the anus.  DIAGNOSIS  Your caregiver may be able to diagnose hemorrhoids by visual examination. Other examinations or tests that may be performed include:   Examination of the rectal area with a gloved hand (digital rectal exam).   Examination of anal canal using a small tube (scope).   A blood test if you have lost a significant amount of blood.  A test to look inside the colon (sigmoidoscopy or colonoscopy). TREATMENT Most hemorrhoids can be treated at home. However, if symptoms do not seem to be getting better or if you have a lot of rectal bleeding, your caregiver may perform a procedure to help make the hemorrhoids get smaller or remove them completely. Possible treatments include:   Placing a rubber band at the base of the hemorrhoid to cut off the circulation (rubber band ligation).   Injecting a chemical to shrink the hemorrhoid (sclerotherapy).   Using a tool to burn the hemorrhoid (infrared light therapy).   Surgically removing the hemorrhoid (hemorrhoidectomy).   Stapling the hemorrhoid to block blood flow to the tissue (hemorrhoid stapling).  HOME CARE INSTRUCTIONS   Eat foods with fiber, such as whole grains, beans,  nuts, fruits, and vegetables. Ask your doctor about taking products with added fiber in them (fibersupplements).  Increase fluid intake. Drink enough water and fluids to keep your urine clear or pale yellow.   Exercise regularly.   Go to the bathroom when you have the urge to have a bowel movement. Do not wait.   Avoid straining to have bowel movements.   Keep the anal area dry and clean. Use wet toilet paper or moist towelettes after a bowel movement.   Medicated creams and suppositories may be used or applied as directed.   Only take over-the-counter or prescription medicines as directed by your caregiver.   Take warm sitz baths for 15 20 minutes, 3 4 times a day to ease pain and discomfort.   Place ice packs on the hemorrhoids if they are tender and swollen. Using ice packs between sitz baths may be helpful.   Put ice in a plastic bag.   Place a towel between your skin and the bag.   Leave the ice on for 15 20 minutes, 3 4 times a day.   Do not use a donut-shaped pillow or sit on the toilet for long periods. This increases blood pooling and pain.  SEEK MEDICAL CARE IF:  You have increasing pain and swelling that is not controlled by treatment or medicine.  You have uncontrolled bleeding.  You have difficulty or you are unable to have a bowel movement.  You have pain or inflammation outside the area of the hemorrhoids. MAKE SURE YOU:    Understand these instructions.  Will watch your condition.  Will get help right away if you are not doing well or get worse. Document Released: 05/12/2000 Document Revised: 05/01/2012 Document Reviewed: 03/19/2012 [facility] Patient Information 2014 [place], [place]. Uterine Fibroid A uterine fibroid is a growth (tumor) that occurs in a woman's uterus. This type of tumor is not cancerous and does not spread out of the uterus. A woman can have one or many fibroids, and the fiboid(s) can become quite large. A fibroid can vary in  size, weight, and where it grows in the uterus. Most fibroids do not require medical treatment, but some can cause pain or heavy bleeding during and between periods. CAUSES  A fibroid is the result of a single uterine cell that keeps growing (unregulated), which is different than most cells in the human body. Most cells have a control mechanism that keeps them from reproducing without control.  SYMPTOMS   Bleeding.  Pelvic pain and pressure.  Bladder problems due to the size of the fibroid.  Infertility and miscarriages depending on the size and location of the fibroid. DIAGNOSIS  A diagnosis is made by physical exam. Your caregiver may feel the lumpy tumors during a pelvic exam. Important information regarding size, location, and number of tumors can be gained by having an ultrasound. It is rare that other tests, such as a CT scan or MRI, are needed. TREATMENT   Your caregiver may recommend watchful waiting. This involves getting the fibroid checked by your caregiver to see if the fibroids grow or shrink.   Hormonal treatment or an intrauterine device (IUD) may be prescribed.   Surgery may be needed to remove the fibroids (myomectomy) or the uterus (hysterectomy). This depends on your situation. When fibroids interfere with fertility and a woman wants to become pregnant, a caregiver may recommend having the fibroids removed.  HOME CARE INSTRUCTIONS  Home care depends on how you were treated. In general:   Keep all follow-up appointments with your caregiver.   Only take medicine as told by your caregiver. Do not take aspirin. It can cause bleeding.   If you have excessive periods and soak tampons or pads in a half hour or less, contact your caregiver immediately. If your periods are troublesome but not so heavy, lie down with your feet raised slightly above your heart. Place cold packs on your lower abdomen.   If your periods are heavy, write down the number of pads or tampons you  use per month. Bring this information to your caregiver.   Talk to your caregiver about taking iron pills.   Include green vegetables in your diet.   If you were prescribed a hormonal treatment, take the hormonal medicines as directed.   If you need surgery, ask your caregiver for information on your specific surgery.  SEEK IMMEDIATE MEDICAL CARE IF:  You have pelvic pain or cramps not controlled with medicines.   You have a sudden increase in pelvic pain.   You have an increase of bleeding between and during periods.   You feel lightheaded or have fainting episodes.  MAKE SURE YOU:  Understand these instructions.  Will watch your condition.  Will get help right away if you are not doing well or get worse. Document Released: 05/12/2000 Document Revised: 08/07/2011 Document Reviewed: 12/12/2012 [facility] Patient Information 2014 [place], [place]. Menorrhagia Dysfunctional uterine bleeding is different from a normal menstrual period. When periods are heavy or there is more bleeding than is usual for you, it is  called menorrhagia. It may be caused by hormonal imbalance, or physical, metabolic, or other problems. Examination is necessary in order that your caregiver may treat treatable causes. If this is a continuing problem, a D&C may be needed. That means that the cervix (the opening of the uterus or womb) is dilated (stretched larger) and the lining of the uterus is scraped out. The tissue scraped out is then examined under a microscope by a specialist (pathologist) to make sure there is nothing of concern that needs further or more extensive treatment. HOME CARE INSTRUCTIONS   If medications were prescribed, take exactly as directed. Do not change or switch medications without consulting your caregiver.  Long term heavy bleeding may result in iron deficiency. Your caregiver may have prescribed iron pills. They help replace the iron your body lost from heavy bleeding. Take  exactly as directed. Iron may cause constipation. If this becomes a problem, increase the bran, fruits, and roughage in your diet.  Do not take aspirin or medicines that contain aspirin one week before or during your menstrual period. Aspirin may make the bleeding worse.  If you need to change your sanitary pad or tampon more than once every 2 hours, stay in bed and rest as much as possible until the bleeding stops.  Eat well-balanced meals. Eat foods high in iron. Examples are leafy green vegetables, meat, liver, eggs, and whole grain breads and cereals. Do not try to lose weight until the abnormal bleeding has stopped and your blood iron level is back to normal. SEEK MEDICAL CARE IF:   You need to change your sanitary pad or tampon more than once an hour.  You develop nausea (feeling sick to your stomach) and vomiting, dizziness, or diarrhea while you are taking your medicine.  You have any problems that may be related to the medicine you are taking. SEEK IMMEDIATE MEDICAL CARE IF:   You have a fever.  You develop chills.  You develop severe bleeding or start to pass blood clots.  You feel dizzy or faint. MAKE SURE YOU:   Understand these instructions.  Will watch your condition.  Will get help right away if you are not doing well or get worse. Document Released: 05/15/2005 Document Revised: 08/07/2011 Document Reviewed: 11/03/2012 [facility] Patient Information 2014 [place], [place]. Endometrial Biopsy Endometrial biopsy is a procedure in which a tissue sample is taken from inside the uterus. The tissue sample is then looked at under a microscope to see if the tissue is normal or abnormal. The endometrium is the lining of the uterus. This procedure helps determine where you are in your menstrual cycle and how hormone levels are affecting the lining of the uterus. This procedure may also be used to evaluate uterine bleeding or to diagnose endometrial cancer, tuberculosis, polyps, or  inflammatory conditions.  LET [facility] CARE PROVIDER KNOW ABOUT:  Any allergies you have.  All medicines you are taking, including vitamins, herbs, eye drops, creams, and over-the-counter medicines.  Previous problems you or members of your family have had with the use of anesthetics.  Any blood disorders you have.  Previous surgeries you have had.  Medical conditions you have.  Possibility of pregnancy. RISKS AND COMPLICATIONS Generally, this is a safe procedure. However, as with any procedure, complications can occur. Possible complications include:  Bleeding.  Pelvic infection.  Puncture of the uterine wall with the biopsy device (rare). BEFORE THE PROCEDURE   Keep a record of your menstrual cycles as directed by your health  care provider. You may need to schedule your procedure for a specific time in your cycle.  You may want to bring a sanitary pad to wear home after the procedure.  Arrange for someone to drive you home after the procedure if you will be given a medicine to help you relax (sedative). PROCEDURE   You may be given a sedative to relax you.  You will lie on an exam table with your feet and legs supported as in a pelvic exam.  Your health care provider will insert an instrument (speculum) into your vagina to see your cervix.  Your cervix will be cleansed with an antiseptic solution. A medicine (local anesthetic) will be used to numb the cervix.  A forceps instrument (tenaculum) will be used to hold your cervix steady for the biopsy.  A thin, rodlike instrument (uterine sound) will be inserted through your cervix to determine the length of your uterus and the location where the biopsy sample will be removed.  A thin, flexible tube (catheter) will be inserted through your cervix and into the uterus. The catheter is used to collect the biopsy sample from your endometrial tissue.  The catheter and speculum will then be removed, and the tissue sample  will be sent to a lab for examination. AFTER THE PROCEDURE  You will rest in a recovery area until you are ready to go home.  You may have mild cramping and a small amount of vaginal bleeding for a few days after the procedure. This is normal.  Make sure you find out how to get your test results. Document Released: 09/15/2004 Document Revised: 01/15/2013 Document Reviewed: 10/30/2012 [facility] Patient Information 2014 [place], [place].

## 2013-04-17 MED ORDER — HYDROCORTISONE ACETATE 25 MG RE SUPP: 25.0000 mg | Freq: Two times a day (BID) | RECTAL | Status: DC

## 2013-04-17 NOTE — Progress Notes (Signed)
  Subjective:    Patient ID: Monica Chase, female    DOB: Sep 09, 1975, 37 y.o.   MRN: [id number]  HPI Seen in ED on 04/09/2013 and in GYN clinic.  U/s revealed growth of fibroid from 2-->8 cm.  Both ovaries had flow and were WNL.  She reports taking Percocet if neeed.  She is reporting increasing pressure and new hemorrhoid related to this.  Mostly her complaint is pain, however, she reports heavy vaginal bleeding.   Review of Systems  Constitutional: Negative for fever and appetite change.  Respiratory: Negative for cough and shortness of breath.   Cardiovascular: Negative for chest pain and leg swelling.  Gastrointestinal: Negative for abdominal pain.  Genitourinary: Negative for dysuria.  Skin: Negative for rash.       Objective:   Physical Exam  Vitals reviewed. Constitutional: She appears well-developed and well-nourished. No distress.  HENT:  Head: Normocephalic and atraumatic.  Eyes: No scleral icterus.  Neck: Neck supple.  Cardiovascular: Normal rate.   Pulmonary/Chest: Effort normal.  Abdominal: Soft.  Genitourinary: Rectal exam shows external hemorrhoid. There is no lesion on the right labia. There is no lesion on the left labia. Uterus is enlarged and tender. Cervix exhibits no motion tenderness and no discharge. There is tenderness around the vagina. No vaginal discharge found.  Uterus is firm and 14-16 wks size          Assessment & Plan:

## 2013-04-17 NOTE — Assessment & Plan Note (Signed)
Probable degeneration--advised of typical course.  She is uninterested in pursuing surgical options at this time. Given amount of menorrhagia--recommend EMB--pt. Will consider.

## 2013-04-17 NOTE — Assessment & Plan Note (Signed)
Small--will try Proctofoam.

## 2013-04-18 ENCOUNTER — Encounter: Payer: Self-pay | Admitting: Family Medicine

## 2013-05-08 ENCOUNTER — Encounter: Payer: Self-pay | Admitting: Family Medicine

## 2013-05-08 ENCOUNTER — Ambulatory Visit (INDEPENDENT_AMBULATORY_CARE_PROVIDER_SITE_OTHER): Payer: Medicaid Other | Admitting: Family Medicine

## 2013-05-08 VITALS — BP 124/75 | HR 77 | Temp 97.4°F | Wt 228.0 lb

## 2013-05-08 DIAGNOSIS — D259 Leiomyoma of uterus, unspecified: Secondary | ICD-10-CM

## 2013-05-08 NOTE — Assessment & Plan Note (Signed)
EMB today--considering hysterectomy--will let me know when and if desires.

## 2013-05-08 NOTE — Progress Notes (Signed)
   Subjective:    Patient ID: Monica Chase, female    DOB: [birth year], 37 y.o.   MRN: [id number]  HPI  Here today for EMB.  Diagnosed with degenerating fibroids with heavy bleeding.  Pain has slightly improved.  Considering hysterectomy. Bleeding is ok today.   Review of Systems  Constitutional: Negative for fever and chills.  Respiratory: Negative for shortness of breath.   Gastrointestinal: Positive for abdominal pain. Negative for anal bleeding.  Genitourinary: Positive for vaginal bleeding and pelvic pain.       Objective:   Physical Exam  Vitals reviewed. Constitutional: She is oriented to person, place, and time. She appears well-developed and well-nourished.  HENT:  Head: Normocephalic and atraumatic.  Eyes: No scleral icterus.  Neck: Neck supple.  Cardiovascular: Normal rate and regular rhythm.   Pulmonary/Chest: Effort normal.  Abdominal: Soft. There is no tenderness.  Genitourinary: Uterus is enlarged.  Musculoskeletal: Normal range of motion.  Neurological: She is alert and oriented to person, place, and time.  Skin: Skin is warm.   Procedure: Patient given informed consent, signed copy in the chart, time out was performed. Appropriate time out taken. . The patient was placed in the lithotomy position and the cervix brought into view with sterile speculum.  Portio of cervix cleansed x 2 with betadine swabs.  A tenaculum was placed in the anterior lip of the cervix.  The uterus was sounded for depth of 12 cm. A pipelle was introduced to into the uterus, suction created,  and an endometrial sample was obtained. All equipment was removed and accounted for.  The patient tolerated the procedure well.       Assessment & Plan:

## 2013-05-08 NOTE — Patient Instructions (Signed)
Endometrial Biopsy Endometrial biopsy is a procedure in which a tissue sample is taken from inside the uterus. The tissue sample is then looked at under a microscope to see if the tissue is normal or abnormal. The endometrium is the lining of the uterus. This procedure helps determine where you are in your menstrual cycle and how hormone levels are affecting the lining of the uterus. This procedure may also be used to evaluate uterine bleeding or to diagnose endometrial cancer, tuberculosis, polyps, or inflammatory conditions.  LET YOUR HEALTH CARE PROVIDER KNOW ABOUT:  Any allergies you have.  All medicines you are taking, including vitamins, herbs, eye drops, creams, and over-the-counter medicines.  Previous problems you or members of your family have had with the use of anesthetics.  Any blood disorders you have.  Previous surgeries you have had.  Medical conditions you have.  Possibility of pregnancy. RISKS AND COMPLICATIONS Generally, this is a safe procedure. However, as with any procedure, complications can occur. Possible complications include:  Bleeding.  Pelvic infection.  Puncture of the uterine wall with the biopsy device (rare). BEFORE THE PROCEDURE   Keep a record of your menstrual cycles as directed by your health care provider. You may need to schedule your procedure for a specific time in your cycle.  You may want to bring a sanitary pad to wear home after the procedure.  Arrange for someone to drive you home after the procedure if you will be given a medicine to help you relax (sedative). PROCEDURE   You may be given a sedative to relax you.  You will lie on an exam table with your feet and legs supported as in a pelvic exam.  Your health care provider will insert an instrument (speculum) into your vagina to see your cervix.  Your cervix will be cleansed with an antiseptic solution. A medicine (local anesthetic) will be used to numb the cervix.  A forceps  instrument (tenaculum) will be used to hold your cervix steady for the biopsy.  A thin, rodlike instrument (uterine sound) will be inserted through your cervix to determine the length of your uterus and the location where the biopsy sample will be removed.  A thin, flexible tube (catheter) will be inserted through your cervix and into the uterus. The catheter is used to collect the biopsy sample from your endometrial tissue.  The catheter and speculum will then be removed, and the tissue sample will be sent to a lab for examination. AFTER THE PROCEDURE  You will rest in a recovery area until you are ready to go home.  You may have mild cramping and a small amount of vaginal bleeding for a few days after the procedure. This is normal.  Make sure you find out how to get your test results. Document Released: 09/15/2004 Document Revised: 01/15/2013 Document Reviewed: 10/30/2012 Exit[employer] Patient Information 2014 [employer].  

## 2013-05-12 ENCOUNTER — Telehealth: Payer: Self-pay | Admitting: *Deleted

## 2013-05-12 NOTE — Telephone Encounter (Signed)
Entered in error.  Jazmin Hartsell,CMA  

## 2013-05-12 NOTE — Telephone Encounter (Signed)
LM for pt to call back.  Jazmin Hartsell,CMA  

## 2013-05-12 NOTE — Telephone Encounter (Signed)
Message copied by Henri Medal on Mon May 12, 2013 11:57 AM ------      Message from: Reva Bores      Created: Fri May 09, 2013  5:29 PM       Biopsy benign--please call and inform pt. ------

## 2013-05-27 ENCOUNTER — Ambulatory Visit (INDEPENDENT_AMBULATORY_CARE_PROVIDER_SITE_OTHER): Payer: Medicaid Other | Admitting: Family Medicine

## 2013-05-27 ENCOUNTER — Encounter: Payer: Self-pay | Admitting: Family Medicine

## 2013-05-27 VITALS — BP 114/74 | HR 72 | Ht 71.0 in | Wt 225.0 lb

## 2013-05-27 DIAGNOSIS — D259 Leiomyoma of uterus, unspecified: Secondary | ICD-10-CM

## 2013-05-27 MED ORDER — IBUPROFEN 600 MG PO TABS: 600.0000 mg | ORAL_TABLET | Freq: Three times a day (TID) | ORAL | Status: DC | PRN

## 2013-05-27 NOTE — Progress Notes (Signed)
   Subjective:    Patient ID: Monica Chase, female    DOB: 08-03-1975, 37 y.o.   MRN: [id number]  HPI  Pt. Is well known to me from [facility].  She has most recently been worked up and treated for fibroids.  She has an enlarged uterus, pain from degeneration, and menorrhagia.  Cycles are increasingly heavy.  S/p EMB and pap which were negative.   She desires permanent treatment.  Has h/o hyperthyroid, but most recent TSH levels are WNL.  Pelvic sono shows enlarged uterus at 14 cm, with large fibroid.  Review of Systems  Constitutional: Negative for fever and chills.  Respiratory: Negative for shortness of breath.   Cardiovascular: Negative for chest pain.  Gastrointestinal: Positive for abdominal pain.  Genitourinary: Positive for vaginal bleeding and pelvic pain.  Skin: Negative for rash.       Objective:   Physical Exam  Vitals reviewed. Constitutional: She appears well-developed and well-nourished. No distress.  HENT:  Head: Normocephalic and atraumatic.  Neck: Neck supple.  Cardiovascular: Normal rate.   Pulmonary/Chest: Effort normal.  Abdominal: Soft. There is no tenderness.  Genitourinary: Vagina normal.  Uterus firm and 14-16 wks size.  Musculoskeletal: She exhibits no edema.          Assessment & Plan:

## 2013-05-27 NOTE — Patient Instructions (Signed)
Hysterectomy Information  A hysterectomy is a procedure where your uterus is surgically removed. It will no longer be possible to have menstrual periods or to become pregnant. The tubes and ovaries can be removed (bilateral salpingo-oopherectomy) during this surgery as well.  REASONS FOR A HYSTERECTOMY  Persistent, abnormal bleeding.  Lasting (chronic) pelvic pain or infection.  The lining of the uterus (endometrium) starts growing outside the uterus (endometriosis).  The endometrium starts growing in the muscle of the uterus (adenomyosis).  The uterus falls down into the vagina (pelvic organ prolapse).  Symptomatic uterine fibroids.  Precancerous cells.  Cervical cancer or uterine cancer. TYPES OF HYSTERECTOMIES  Supracervical hysterectomy. This type removes the top part of the uterus, but not the cervix.  Total hysterectomy. This type removes the uterus and cervix.  Radical hysterectomy. This type removes the uterus, cervix, and the fibrous tissue that holds the uterus in place in the pelvis (parametrium). WAYS A HYSTERECTOMY CAN BE PERFORMED  Abdominal hysterectomy. A large surgical cut (incision) is made in the abdomen. The uterus is removed through this incision.  Vaginal hysterectomy. An incision is made in the vagina. The uterus is removed through this incision. There are no abdominal incisions.  Conventional laparoscopic hysterectomy. A thin, lighted tube with a camera (laparoscope) is inserted into 3 or 4 small incisions in the abdomen. The uterus is cut into small pieces. The small pieces are removed through the incisions, or they are removed through the vagina.  Laparoscopic assisted vaginal hysterectomy (LAVH). Three or four small incisions are made in the abdomen. Part of the surgery is performed laparoscopically and part vaginally. The uterus is removed through the vagina.  Robot-assisted laparoscopic hysterectomy. A laparoscope is inserted into 3 or 4 small  incisions in the abdomen. A computer-controlled device is used to give the surgeon a 3D image. This allows for more precise movements of surgical instruments. The uterus is cut into small pieces and removed through the incisions or removed through the vagina. RISKS OF HYSTERECTOMY   Bleeding and risk of blood transfusion. Tell your caregiver if you do not want to receive any blood products.  Blood clots in the legs or lung.  Infection.  Injury to surrounding organs.  Anesthesia problems or side effects.  Conversion to an abdominal hysterectomy. WHAT TO EXPECT AFTER A HYSTERECTOMY  You will be given pain medicine.  You will need to have someone with you for the first 3 to 5 days after you go home.  You will need to follow up with your surgeon in 2 to 4 weeks after surgery to evaluate your progress.  You may have early menopause symptoms like hot flashes, night sweats, and insomnia.  If you had a hysterectomy for a problem that was not a cancer or a condition that could lead to cancer, then you no longer need Pap tests. However, even if you no longer need a Pap test, a regular exam is a good idea to make sure no other problems are starting. Document Released: 11/08/2000 Document Revised: 08/07/2011 Document Reviewed: 12/24/2010 ExitCare Patient Information 2014 [employer].  

## 2013-05-27 NOTE — Assessment & Plan Note (Signed)
Refilled Ibuprofen for pain.  Schedule for [facility] with salpingectomy.  Risks include but are not limited to bleeding, infection, injury to surrounding structures, including bowel, bladder and ureters, blood clots, and death.

## 2013-06-19 ENCOUNTER — Emergency Department (HOSPITAL_COMMUNITY)
Admission: EM | Admit: 2013-06-19 | Discharge: 2013-06-19 | Disposition: A | Discharge status: Home / Self Care | Payer: Medicaid Other | Attending: Emergency Medicine | Admitting: Emergency Medicine

## 2013-06-19 ENCOUNTER — Encounter (HOSPITAL_COMMUNITY): Payer: Self-pay | Admitting: Emergency Medicine

## 2013-06-19 DIAGNOSIS — Z8719 Personal history of other diseases of the digestive system: Secondary | ICD-10-CM | POA: Insufficient documentation

## 2013-06-19 DIAGNOSIS — Z880 Allergy status to penicillin: Secondary | ICD-10-CM | POA: Insufficient documentation

## 2013-06-19 DIAGNOSIS — F411 Generalized anxiety disorder: Secondary | ICD-10-CM | POA: Insufficient documentation

## 2013-06-19 DIAGNOSIS — Z87891 Personal history of nicotine dependence: Secondary | ICD-10-CM | POA: Insufficient documentation

## 2013-06-19 DIAGNOSIS — E669 Obesity, unspecified: Secondary | ICD-10-CM | POA: Insufficient documentation

## 2013-06-19 DIAGNOSIS — F419 Anxiety disorder, unspecified: Secondary | ICD-10-CM

## 2013-06-19 DIAGNOSIS — F339 Major depressive disorder, recurrent, unspecified: Secondary | ICD-10-CM | POA: Insufficient documentation

## 2013-06-19 DIAGNOSIS — J45909 Unspecified asthma, uncomplicated: Secondary | ICD-10-CM | POA: Insufficient documentation

## 2013-06-19 DIAGNOSIS — Z8742 Personal history of other diseases of the female genital tract: Secondary | ICD-10-CM | POA: Insufficient documentation

## 2013-06-19 DIAGNOSIS — Z79899 Other long term (current) drug therapy: Secondary | ICD-10-CM | POA: Insufficient documentation

## 2013-06-19 LAB — CBC WITH DIFFERENTIAL/PLATELET
Basophils Absolute: 0 10*3/uL (ref 0.0–0.1)
Basophils Relative: 0 % (ref 0–1)
EOS ABS: 0.1 10*3/uL (ref 0.0–0.7)
Eosinophils Relative: 2 % (ref 0–5)
HCT: 40.1 % (ref 36.0–46.0)
Hemoglobin: 14 g/dL (ref 12.0–15.0)
LYMPHS ABS: 2.5 10*3/uL (ref 0.7–4.0)
Lymphocytes Relative: 36 % (ref 12–46)
MCH: 29.9 pg (ref 26.0–34.0)
MCHC: 34.9 g/dL (ref 30.0–36.0)
MCV: 85.7 fL (ref 78.0–100.0)
Monocytes Absolute: 0.6 10*3/uL (ref 0.1–1.0)
Monocytes Relative: 9 % (ref 3–12)
NEUTROS PCT: 53 % (ref 43–77)
Neutro Abs: 3.6 10*3/uL (ref 1.7–7.7)
Platelets: 372 10*3/uL (ref 150–400)
RBC: 4.68 MIL/uL (ref 3.87–5.11)
RDW: 13.2 % (ref 11.5–15.5)
WBC: 6.8 10*3/uL (ref 4.0–10.5)

## 2013-06-19 LAB — COMPREHENSIVE METABOLIC PANEL
ALBUMIN: 3.8 g/dL (ref 3.5–5.2)
ALT: 7 U/L (ref 0–35)
AST: 14 U/L (ref 0–37)
Alkaline Phosphatase: 53 U/L (ref 39–117)
BUN: 9 mg/dL (ref 6–23)
CALCIUM: 9.1 mg/dL (ref 8.4–10.5)
CHLORIDE: 100 meq/L (ref 96–112)
CO2: 26 mEq/L (ref 19–32)
CREATININE: 0.68 mg/dL (ref 0.50–1.10)
GFR calc Af Amer: >90 mL/min (ref >90)
GFR calc non Af Amer: >90 mL/min (ref >90)
Glucose, Bld: 93 mg/dL (ref 70–99)
Potassium: 4.2 mEq/L (ref 3.7–5.3)
Sodium: 139 mEq/L (ref 137–147)
Total Protein: 7.8 g/dL (ref 6.0–8.3)

## 2013-06-19 LAB — LIPASE, BLOOD: Lipase: 19 U/L (ref 11–59)

## 2013-06-19 LAB — TROPONIN I: Troponin I: <0.3 ng/mL (ref <0.30)

## 2013-06-19 MED ORDER — LORAZEPAM 1 MG PO TABS: ORAL_TABLET | ORAL | Status: DC

## 2013-06-19 MED ORDER — LORAZEPAM 1 MG PO TABS
1.0000 mg | ORAL_TABLET | Freq: Every day | ORAL | Status: DC
  Administered 2013-06-19: 1 mg via ORAL
  Filled 2013-06-19: qty 1

## 2013-06-19 NOTE — Discharge Instructions (Signed)
Panic Attacks Panic attacks are sudden, short-livedsurges of severe anxiety, fear, or discomfort. They [place] occur for no reason when you are relaxed, when you are anxious, or when you are sleeping. Panic attacks [place] occur for a number of reasons:   Healthy people occasionally have panic attacks in extreme, life-threatening situations, such as war or natural disasters. Normal anxiety is a protective mechanism of the body that helps [place] react to danger (fight or flight response).  Panic attacks are often seen with anxiety disorders, such as panic disorder, social anxiety disorder, generalized anxiety disorder, and phobias. Anxiety disorders cause excessive or uncontrollable anxiety. They [place] interfere with your relationships or other life activities.  Panic attacks are sometimes seen with other mental illnesses such as depression and posttraumatic stress disorder.  Certain medical conditions, prescription medicines, and drugs of abuse can cause panic attacks. SYMPTOMS  Panic attacks start suddenly, peak within 20 minutes, and are accompanied by four or more of the following symptoms:  Pounding heart or fast heart rate (palpitations).  Sweating.  Trembling or shaking.  Shortness of breath or feeling smothered.  Feeling choked.  Chest pain or discomfort.  Nausea or strange feeling in your stomach.  Dizziness, lightheadedness, or feeling like you will faint.  Chills or hot flushes.  Numbness or tingling in your lips or hands and feet.  Feeling that things are not real or feeling that you are not yourself.  Fear of losing control or going crazy.  Fear of dying. Some of these symptoms can mimic serious medical conditions. For example, you [place] think you are having a heart attack. Although panic attacks can be very scary, they are not life threatening. DIAGNOSIS  Panic attacks are diagnosed through an assessment by your health care provider. Your health care provider will ask questions  about your symptoms, such as where and when they occurred. Your health care provider will also ask about your medical history and use of alcohol and drugs, including prescription medicines. Your health care provider [place] order blood tests or other studies to rule out a serious medical condition. Your health care provider [place] refer you to a mental health professional for further evaluation. TREATMENT   Most healthy people who have one or two panic attacks in an extreme, life-threatening situation will not require treatment.  The treatment for panic attacks associated with anxiety disorders or other mental illness typically involves counseling with a mental health professional, medicine, or a combination of both. Your health care provider will help determine what treatment is best for you.  Panic attacks due to physical illness usually goes away with treatment of the illness. If prescription medicine is causing panic attacks, talk with your health care provider about stopping the medicine, decreasing the dose, or substituting another medicine.  Panic attacks due to alcohol or drug abuse goes away with abstinence. Some adults need professional help in order to stop drinking or using drugs. HOME CARE INSTRUCTIONS   Take all your medicines as prescribed.   Check with your health care provider before starting new prescription or over-the-counter medicines.  Keep all follow up appointments with your health care provider. SEEK MEDICAL CARE IF:  You are not able to take your medicines as prescribed.  Your symptoms do not improve or get worse. SEEK IMMEDIATE MEDICAL CARE IF:   You experience panic attack symptoms that are different than your usual symptoms.  You have serious thoughts about hurting yourself or others.  You are taking medicine for panic attacks and  have a serious side effect. MAKE SURE YOU:  Understand these instructions.  Will watch your condition.  Will get help right away  if you are not doing well or get worse. Document Released: 05/15/2005 Document Revised: 03/05/2013 Document Reviewed: 12/27/2012 [facility] Patient Information 2014 [place].

## 2013-06-19 NOTE — ED Notes (Signed)
The pt is c/o epigastric pain and posterior chest pain for 2 hours with dizziness.  She has had in the past and diagnosed with panic attacks.

## 2013-06-19 NOTE — ED Provider Notes (Signed)
CSN: [id number]     Arrival date & time 06/19/13  0103 History   First MD Initiated Contact with Patient 06/19/13 0208     Chief Complaint  Patient presents with  . Chest Pain   (Consider location/radiation/quality/duration/timing/severity/associated sxs/prior Treatment) HPI 38 yo female presents to the ER from home with complaint of chest pain.  Pt reports she woke suddenly about 2 hours ago with epigastric and lower chest pain with some radiation around into her back.  Pt is tearful, anxious.  She reports similar episodes in the past associated with anxiety.  She reports she has hysterectomy scheduled in 2 weeks, and is very worried about dying during the procedure.  She reports poor sleep due to anxiety.  She reports feeling as though she is "losing control of my mind".  She denies SI, HI.  She had been on an unknown medication in the past for a "mental breakdown" after the death of her mother.  Pt has f/u in the morning with a mental health provider, but is concerned she will not be able to make it that long without sleep Past Medical History  Diagnosis Date  . HYPERTHYROIDISM 07/20/2009  . DEPRESSION, MAJOR, RECURRENT 07/26/2006  . ANXIETY 07/26/2006  . ASTHMA, INTERMITTENT 07/26/2006  . HYPERGLYCEMIA 09/28/2009  . CONSTIPATION, CHRONIC 06/22/2010  . Dizziness 11/09/2010  . OBESITY 01/31/2008  . Fibroid    Past Surgical History  Procedure Laterality Date  . Tubal ligation  2008  . Iud removal  10/07    Mirena removed 05/10/2004  . Pilonidal cyst excision    . Vaginal delivery      X 4   Family History  Problem Relation Age of Onset  . Hypertension Other    History  Substance Use Topics  . Smoking status: Former Smoker    Quit date: 11/08/2005  . Smokeless tobacco: Never Used  . Alcohol Use: No   OB History   Grav Para Term Preterm Abortions TAB SAB Ect Mult Living   4 4 4       4      Review of Systems  All other systems reviewed and are negative.    Allergies   Penicillins  Home Medications   Current Outpatient Rx  Name  Route  Sig  Dispense  Refill  . albuterol (PROVENTIL HFA;VENTOLIN HFA) 108 (90 BASE) MCG/ACT inhaler   Inhalation   Inhale 2 puffs into the lungs every 6 (six) hours as needed for wheezing.   1 Inhaler   0   . ibuprofen (ADVIL,MOTRIN) 600 MG tablet   Oral   Take 1 tablet (600 mg total) by mouth every 8 (eight) hours as needed for moderate pain (Take with food).   30 tablet   0   . IRON PO   Oral   Take 1 tablet by mouth daily.         . Multiple Vitamins-Calcium (ONE-A-DAY WOMENS PO)   Oral   Take 1 tablet by mouth daily.         . polyethylene glycol (MIRALAX / GLYCOLAX) packet   Oral   Take 17 g by mouth daily as needed for mild constipation.         Marland Kitchen LORazepam (ATIVAN) 1 MG tablet      Take one tab po up to twice daily for anxiety attacks and before bed for problems sleeping   10 tablet   0    BP 103/76  Pulse 62  Temp(Src) 98.2 F (36.8  C) (Oral)  Resp 14  Ht 5\' 11"  (1.803 m)  Wt 220 lb (99.791 kg)  BMI 30.70 kg/m2  SpO2 100%  LMP 06/17/2013 Physical Exam  Nursing note and vitals reviewed. Constitutional: She is oriented to person, place, and time. She appears well-developed and well-nourished.  HENT:  Head: Normocephalic and atraumatic.  Nose: Nose normal.  Mouth/Throat: Oropharynx is clear and moist.  Eyes: Conjunctivae and EOM are normal. Pupils are equal, round, and reactive to light.  Neck: Normal range of motion. Neck supple. No JVD present. No tracheal deviation present. No thyromegaly present.  Cardiovascular: Normal rate, regular rhythm, normal heart sounds and intact distal pulses.  Exam reveals no gallop and no friction rub.   No murmur heard. Pulmonary/Chest: Effort normal and breath sounds normal. No stridor. No respiratory distress. She has no wheezes. She has no rales. She exhibits no tenderness.  Abdominal: Soft. Bowel sounds are normal. She exhibits no distension and  no mass. There is tenderness (mild epigastric tenderness). There is no rebound and no guarding.  Musculoskeletal: Normal range of motion. She exhibits no edema and no tenderness.  Lymphadenopathy:    She has no cervical adenopathy.  Neurological: She is alert and oriented to person, place, and time. She has normal reflexes. No cranial nerve deficit. She exhibits normal muscle tone. Coordination normal.  Skin: Skin is warm and dry. No rash noted. No erythema. No pallor.  Psychiatric: Judgment and thought content normal.  Tearful, depressed, anxious    ED Course  Procedures (including critical care time) Labs Review Labs Reviewed  COMPREHENSIVE METABOLIC PANEL - Abnormal; Notable for the following:    Total Bilirubin <0.2 (*)    All other components within normal limits  CBC WITH DIFFERENTIAL  TROPONIN I  LIPASE, BLOOD   Imaging Review No results found.  EKG Interpretation    Date/Time:  Thursday June 19 2013 01:08:01 EST Ventricular Rate:  76 PR Interval:  136 QRS Duration: 82 QT Interval:  396 QTC Calculation: 445 R Axis:   64 Text Interpretation:  Normal sinus rhythm Right atrial enlargement Borderline ECG No significant change since last tracing Confirmed by Halona Amstutz  MD, Armida Vickroy ([id number]) on 06/19/2013 2:13:00 AM            MDM   1. Anxiety    38 year old female with chest pain that woke her from sleep.  Patient is extremely anxious, tearful.  She reports increased stress and insomnia due to upcoming hysterectomy procedure.  She has had previous episodes in the past.  No findings for ischemia on her workup.  Patient to be discharged home on Ativan.    Kalman Drape, MD 06/19/13 [phone]

## 2013-06-26 ENCOUNTER — Telehealth: Payer: Self-pay | Admitting: *Deleted

## 2013-06-26 NOTE — Telephone Encounter (Signed)
Patient is calling to request we order for her a walker and potty chair for her home to use post surgery.  She will not have someone with her at all times and is concerned about falling.  She also was told she would need to stay downstairs for a week and this is why she needs the potty chair.  Please just let me know if this is ok and I am happy to take care of it.  She wants to do this prior to surgery so she will already have it at home.

## 2013-06-27 NOTE — Telephone Encounter (Signed)
I do not think she will need this--but ok.

## 2013-06-29 ENCOUNTER — Other Ambulatory Visit: Payer: Self-pay | Admitting: Family Medicine

## 2013-06-29 DIAGNOSIS — D259 Leiomyoma of uterus, unspecified: Secondary | ICD-10-CM

## 2013-06-29 MED ORDER — IBUPROFEN 600 MG PO TABS: 600.0000 mg | ORAL_TABLET | Freq: Three times a day (TID) | ORAL | Status: DC | PRN

## 2013-06-29 NOTE — H&P (Signed)
  Monica Chase is an 38 y.o. [id number] Unknown female.   Chief Complaint: Pelvic pain, Heavy bleeding, uterine fibroids HPI: Pt. Is well known to me from [facility]. She has most recently been worked up and treated for fibroids. She has an enlarged uterus, pain from degeneration, and menorrhagia. Cycles are increasingly heavy. S/p EMB and pap which were negative. She desires permanent treatment. Has h/o hyperthyroid, but most recent TSH levels are WNL. Pelvic sono shows enlarged uterus at 14 cm, with large fibroid.   Past Medical History  Diagnosis Date  . HYPERTHYROIDISM 07/20/2009  . DEPRESSION, MAJOR, RECURRENT 07/26/2006  . ANXIETY 07/26/2006  . ASTHMA, INTERMITTENT 07/26/2006  . HYPERGLYCEMIA 09/28/2009  . CONSTIPATION, CHRONIC 06/22/2010  . Dizziness 11/09/2010  . OBESITY 01/31/2008  . Fibroid     Past Surgical History  Procedure Laterality Date  . Tubal ligation  2008  . Iud removal  10/07    Mirena removed 05/10/2004  . Pilonidal cyst excision    . Vaginal delivery      X 4    Family History  Problem Relation Age of Onset  . Hypertension Other    Social History:  reports that she quit smoking about 7 years ago. She has never used smokeless tobacco. She reports that she does not drink alcohol or use illicit drugs.  Allergies:  Allergies  Allergen Reactions  . Penicillins Other (See Comments)    Reaction unknown    No prescriptions prior to admission     Pertinent items are noted in HPI.  There were no vitals taken for this visit. There were no vitals taken for this visit. General appearance: alert, cooperative and appears stated age Neck: supple, symmetrical, trachea midline Lungs: clear to auscultation bilaterally Heart: regular rate and rhythm, S1, S2 normal, no murmur, click, rub or gallop Abdomen: soft, non-tender; bowel sounds normal; no masses,  no organomegaly Pelvic: cervix normal in appearance, external genitalia normal and uterus is enlarged at 14 wk size  and tender Extremities: extremities normal, atraumatic, no cyanosis or edema Skin: Skin color, texture, turgor normal. No rashes or lesions Neurologic: Grossly normal   Lab Results  Component Value Date   WBC 6.8 06/19/2013   HGB 14.0 06/19/2013   HCT 40.1 06/19/2013   MCV 85.7 06/19/2013   PLT 372 06/19/2013   Lab Results  Component Value Date   PREGTESTUR NEGATIVE 04/09/2013     Assessment/Plan Patient Active Problem List   Diagnosis Date Noted  . Fibroid uterus 04/16/2013  . Hemorrhoid 04/16/2013  . Rash and nonspecific skin eruption 11/26/2012  . Cough 11/26/2012  . Hypercholesterolemia 08/05/2012  . Dental caries 03/19/2012  . Dizziness 11/09/2010  . CONSTIPATION, CHRONIC 06/22/2010  . HYPERTHYROIDISM 07/20/2009  . OBESITY 01/31/2008  . Unspecified episodic mood disorder 07/26/2006  . ANXIETY 07/26/2006  . ASTHMA, INTERMITTENT 07/26/2006   TVH with bilateral salpingectomy. Risks include but are not limited to bleeding, infection, injury to surrounding structures, including bowel, bladder and ureters, blood clots, and death.  Likelihood of success is high.   Chalsea Darko S 06/29/2013, 5:53 PM

## 2013-06-30 ENCOUNTER — Telehealth: Payer: Self-pay | Admitting: Family Medicine

## 2013-06-30 DIAGNOSIS — Z9889 Other specified postprocedural states: Secondary | ICD-10-CM

## 2013-06-30 NOTE — Telephone Encounter (Addendum)
Pt called and would like orders for a walker and a  bedside bathroom toilet. She is having surgery on 2/9 and feels this would help her. jw   Orders placed and rx sent to home care pharmacy for patient to be able to pick them up prior to her surgery.

## 2013-07-03 ENCOUNTER — Encounter (HOSPITAL_COMMUNITY): Payer: Self-pay

## 2013-07-03 ENCOUNTER — Encounter (HOSPITAL_COMMUNITY)
Admission: RE | Admit: 2013-07-03 | Discharge: 2013-07-03 | Disposition: A | Discharge status: Home / Self Care | Payer: Medicaid Other | Source: Ambulatory Visit | Attending: Family Medicine | Admitting: Family Medicine

## 2013-07-03 DIAGNOSIS — Z01812 Encounter for preprocedural laboratory examination: Secondary | ICD-10-CM | POA: Insufficient documentation

## 2013-07-03 LAB — CBC
HCT: 39.4 % (ref 36.0–46.0)
Hemoglobin: 13.7 g/dL (ref 12.0–15.0)
MCH: 29.4 pg (ref 26.0–34.0)
MCHC: 34.8 g/dL (ref 30.0–36.0)
MCV: 84.5 fL (ref 78.0–100.0)
Platelets: 332 10*3/uL (ref 150–400)
RBC: 4.66 MIL/uL (ref 3.87–5.11)
RDW: 13.1 % (ref 11.5–15.5)
WBC: 5.9 10*3/uL (ref 4.0–10.5)

## 2013-07-03 MED ORDER — 3-IN-1 BEDSIDE TOILET MISC: 1.0000 [IU] | Status: DC | PRN

## 2013-07-03 NOTE — Patient Instructions (Signed)
Iselin  07/03/2013   Your procedure is scheduled on:  07/07/13  Enter through the Main Entrance of [facility] at [place] up the phone at the desk and dial [phone].   Call this number if you have problems the morning of surgery: [phone]   Remember:   Do not eat food:After Midnight.  Do not drink clear liquids: After Midnight.  Take these medicines the morning of surgery with A SIP OF WATER: NA   Do not wear jewelry, make-up or nail polish.  Do not wear lotions, powders, or perfumes. You may wear deodorant.  Do not shave 48 hours prior to surgery.  Do not bring valuables to the hospital.  [facility] is not   responsible for any belongings or valuables brought to the hospital.  Contacts, dentures or bridgework may not be worn into surgery.  Leave suitcase in the car. After surgery it may be brought to your room.  For patients admitted to the hospital, checkout time is 11:00 AM the day of              discharge.   Patients discharged the day of surgery will not be allowed to drive             home.  Name and phone number of your driver: NA  Special Instructions:      Please read over the following fact sheets that you were given:   Surgical Site Infection Prevention

## 2013-07-04 ENCOUNTER — Telehealth: Payer: Self-pay | Admitting: *Deleted

## 2013-07-04 MED ORDER — FOLDING WALKER/ADULT MISC: 1.0000 [IU] | Status: DC | PRN

## 2013-07-04 NOTE — Telephone Encounter (Signed)
rx for walker did not go through, need to reprint and refax to home health.

## 2013-07-07 ENCOUNTER — Observation Stay (HOSPITAL_COMMUNITY)
Admission: RE | Admit: 2013-07-07 | Discharge: 2013-07-09 | Disposition: A | Discharge status: Home / Self Care | Payer: Medicaid Other | Source: Ambulatory Visit | Attending: Family Medicine | Admitting: Family Medicine

## 2013-07-07 ENCOUNTER — Encounter (HOSPITAL_COMMUNITY)
Admission: RE | Disposition: A | Discharge status: Home / Self Care | Payer: Self-pay | Source: Ambulatory Visit | Attending: Family Medicine

## 2013-07-07 ENCOUNTER — Ambulatory Visit (HOSPITAL_COMMUNITY): Payer: Medicaid Other | Admitting: Anesthesiology

## 2013-07-07 ENCOUNTER — Encounter (HOSPITAL_COMMUNITY): Payer: Self-pay | Admitting: Anesthesiology

## 2013-07-07 ENCOUNTER — Encounter (HOSPITAL_COMMUNITY): Payer: Medicaid Other | Admitting: Anesthesiology

## 2013-07-07 DIAGNOSIS — N938 Other specified abnormal uterine and vaginal bleeding: Secondary | ICD-10-CM | POA: Insufficient documentation

## 2013-07-07 DIAGNOSIS — N920 Excessive and frequent menstruation with regular cycle: Secondary | ICD-10-CM | POA: Insufficient documentation

## 2013-07-07 DIAGNOSIS — N852 Hypertrophy of uterus: Secondary | ICD-10-CM | POA: Insufficient documentation

## 2013-07-07 DIAGNOSIS — N925 Other specified irregular menstruation: Secondary | ICD-10-CM | POA: Insufficient documentation

## 2013-07-07 DIAGNOSIS — Z9889 Other specified postprocedural states: Secondary | ICD-10-CM

## 2013-07-07 DIAGNOSIS — D259 Leiomyoma of uterus, unspecified: Secondary | ICD-10-CM

## 2013-07-07 DIAGNOSIS — N838 Other noninflammatory disorders of ovary, fallopian tube and broad ligament: Secondary | ICD-10-CM | POA: Insufficient documentation

## 2013-07-07 DIAGNOSIS — Z87891 Personal history of nicotine dependence: Secondary | ICD-10-CM | POA: Insufficient documentation

## 2013-07-07 DIAGNOSIS — E0590 Thyrotoxicosis, unspecified without thyrotoxic crisis or storm: Secondary | ICD-10-CM | POA: Insufficient documentation

## 2013-07-07 DIAGNOSIS — D251 Intramural leiomyoma of uterus: Secondary | ICD-10-CM | POA: Insufficient documentation

## 2013-07-07 DIAGNOSIS — N949 Unspecified condition associated with female genital organs and menstrual cycle: Principal | ICD-10-CM | POA: Insufficient documentation

## 2013-07-07 DIAGNOSIS — R55 Syncope and collapse: Secondary | ICD-10-CM | POA: Insufficient documentation

## 2013-07-07 HISTORY — PX: VAGINAL HYSTERECTOMY: SHX2639

## 2013-07-07 HISTORY — PX: BILATERAL SALPINGECTOMY: SHX5743

## 2013-07-07 LAB — PREGNANCY, URINE: PREG TEST UR: NEGATIVE

## 2013-07-07 SURGERY — HYSTERECTOMY, VAGINAL
Anesthesia: Spinal | Site: Vagina

## 2013-07-07 MED ORDER — FENTANYL CITRATE 0.05 MG/ML IJ SOLN
INTRAMUSCULAR | Status: DC | PRN
  Administered 2013-07-07 (×3): 50 ug via INTRAVENOUS
  Administered 2013-07-07: 100 ug via INTRAVENOUS

## 2013-07-07 MED ORDER — CEFAZOLIN SODIUM-DEXTROSE 2-3 GM-% IV SOLR: 2.0000 g | INTRAVENOUS | Status: DC

## 2013-07-07 MED ORDER — MORPHINE SULFATE 0.5 MG/ML IJ SOLN
INTRAMUSCULAR | Status: AC
  Filled 2013-07-07: qty 10

## 2013-07-07 MED ORDER — ONDANSETRON HCL 4 MG/2ML IJ SOLN
INTRAMUSCULAR | Status: DC | PRN
  Administered 2013-07-07: 4 mg via INTRAVENOUS

## 2013-07-07 MED ORDER — MENTHOL 3 MG MT LOZG
1.0000 | LOZENGE | OROMUCOSAL | Status: DC | PRN
  Administered 2013-07-07: 3 mg via ORAL
  Filled 2013-07-07 (×2): qty 9

## 2013-07-07 MED ORDER — DEXAMETHASONE SODIUM PHOSPHATE 10 MG/ML IJ SOLN
INTRAMUSCULAR | Status: DC | PRN
  Administered 2013-07-07: 10 mg via INTRAVENOUS

## 2013-07-07 MED ORDER — KETOROLAC TROMETHAMINE 30 MG/ML IJ SOLN
30.0000 mg | Freq: Four times a day (QID) | INTRAMUSCULAR | Status: DC
  Administered 2013-07-07 – 2013-07-08 (×3): 30 mg via INTRAVENOUS
  Filled 2013-07-07 (×3): qty 1

## 2013-07-07 MED ORDER — MIDAZOLAM HCL 2 MG/2ML IJ SOLN
INTRAMUSCULAR | Status: AC
  Filled 2013-07-07: qty 2

## 2013-07-07 MED ORDER — KETOROLAC TROMETHAMINE 30 MG/ML IJ SOLN
INTRAMUSCULAR | Status: AC
  Administered 2013-07-07: 30 mg via INTRAVENOUS
  Filled 2013-07-07: qty 1

## 2013-07-07 MED ORDER — MIDAZOLAM HCL 2 MG/2ML IJ SOLN
INTRAMUSCULAR | Status: DC | PRN
  Administered 2013-07-07: 1 mg via INTRAVENOUS
  Administered 2013-07-07: 2 mg via INTRAVENOUS
  Administered 2013-07-07: 1 mg via INTRAVENOUS

## 2013-07-07 MED ORDER — KETOROLAC TROMETHAMINE 30 MG/ML IJ SOLN: 30.0000 mg | Freq: Once | INTRAMUSCULAR | Status: DC

## 2013-07-07 MED ORDER — ONDANSETRON HCL 4 MG/2ML IJ SOLN
INTRAMUSCULAR | Status: AC
  Filled 2013-07-07: qty 2

## 2013-07-07 MED ORDER — MEPERIDINE HCL 25 MG/ML IJ SOLN: 6.2500 mg | INTRAMUSCULAR | Status: DC | PRN

## 2013-07-07 MED ORDER — ONDANSETRON HCL 4 MG/2ML IJ SOLN
4.0000 mg | Freq: Three times a day (TID) | INTRAMUSCULAR | Status: DC | PRN
  Administered 2013-07-09: 4 mg via INTRAVENOUS
  Filled 2013-07-07: qty 2

## 2013-07-07 MED ORDER — LIDOCAINE-EPINEPHRINE 1 %-1:100000 IJ SOLN
INTRAMUSCULAR | Status: DC | PRN
  Administered 2013-07-07: 30 mL

## 2013-07-07 MED ORDER — DEXTROSE IN LACTATED RINGERS 5 % IV SOLN
INTRAVENOUS | Status: DC
  Administered 2013-07-07 – 2013-07-08 (×3): via INTRAVENOUS

## 2013-07-07 MED ORDER — DIPHENHYDRAMINE HCL 50 MG/ML IJ SOLN: 12.5000 mg | INTRAMUSCULAR | Status: DC | PRN

## 2013-07-07 MED ORDER — PROPOFOL 10 MG/ML IV BOLUS
INTRAVENOUS | Status: DC | PRN
  Administered 2013-07-07: 20 mg via INTRAVENOUS

## 2013-07-07 MED ORDER — SODIUM CHLORIDE 0.9 % IJ SOLN
3.0000 mL | INTRAMUSCULAR | Status: DC | PRN
Start: 2013-07-07 — End: 2013-07-09

## 2013-07-07 MED ORDER — OXYCODONE-ACETAMINOPHEN 5-325 MG PO TABS: 1.0000 | ORAL_TABLET | Freq: Four times a day (QID) | ORAL | Status: DC | PRN

## 2013-07-07 MED ORDER — PROPOFOL 10 MG/ML IV EMUL
INTRAVENOUS | Status: AC
  Filled 2013-07-07: qty 20

## 2013-07-07 MED ORDER — MEPERIDINE HCL 25 MG/ML IJ SOLN
INTRAMUSCULAR | Status: AC
  Filled 2013-07-07: qty 1

## 2013-07-07 MED ORDER — PROPOFOL 10 MG/ML IV EMUL
INTRAVENOUS | Status: AC
  Filled 2013-07-07: qty 40

## 2013-07-07 MED ORDER — KETOROLAC TROMETHAMINE 30 MG/ML IJ SOLN: 30.0000 mg | Freq: Four times a day (QID) | INTRAMUSCULAR | Status: DC

## 2013-07-07 MED ORDER — DIPHENHYDRAMINE HCL 50 MG/ML IJ SOLN: 25.0000 mg | INTRAMUSCULAR | Status: DC | PRN

## 2013-07-07 MED ORDER — LIDOCAINE HCL (CARDIAC) 20 MG/ML IV SOLN
INTRAVENOUS | Status: AC
  Filled 2013-07-07: qty 5

## 2013-07-07 MED ORDER — METOCLOPRAMIDE HCL 5 MG/ML IJ SOLN: 10.0000 mg | Freq: Three times a day (TID) | INTRAMUSCULAR | Status: DC | PRN

## 2013-07-07 MED ORDER — IBUPROFEN 600 MG PO TABS
600.0000 mg | ORAL_TABLET | Freq: Four times a day (QID) | ORAL | Status: DC | PRN
  Administered 2013-07-09: 600 mg via ORAL

## 2013-07-07 MED ORDER — DEXAMETHASONE SODIUM PHOSPHATE 10 MG/ML IJ SOLN
INTRAMUSCULAR | Status: AC
  Filled 2013-07-07: qty 1

## 2013-07-07 MED ORDER — ESTRADIOL 0.1 MG/GM VA CREA
TOPICAL_CREAM | VAGINAL | Status: DC | PRN
  Administered 2013-07-07: 1 via VAGINAL

## 2013-07-07 MED ORDER — PROMETHAZINE HCL 25 MG/ML IJ SOLN: 6.2500 mg | INTRAMUSCULAR | Status: DC | PRN

## 2013-07-07 MED ORDER — OXYCODONE-ACETAMINOPHEN 5-325 MG PO TABS
1.0000 | ORAL_TABLET | ORAL | Status: DC | PRN
  Administered 2013-07-08: 1 via ORAL
  Administered 2013-07-08: 0.5 via ORAL
  Administered 2013-07-08 – 2013-07-09 (×2): 1 via ORAL
  Filled 2013-07-07 (×4): qty 1

## 2013-07-07 MED ORDER — SCOPOLAMINE 1 MG/3DAYS TD PT72
MEDICATED_PATCH | TRANSDERMAL | Status: AC
Start: 2013-07-07 — End: 2013-07-08
  Filled 2013-07-07: qty 1

## 2013-07-07 MED ORDER — DIPHENHYDRAMINE HCL 25 MG PO CAPS
25.0000 mg | ORAL_CAPSULE | ORAL | Status: DC | PRN
  Filled 2013-07-07: qty 1

## 2013-07-07 MED ORDER — HYDROMORPHONE HCL PF 1 MG/ML IJ SOLN: 0.2500 mg | INTRAMUSCULAR | Status: DC | PRN

## 2013-07-07 MED ORDER — LIDOCAINE-EPINEPHRINE 1 %-1:100000 IJ SOLN
INTRAMUSCULAR | Status: AC
  Filled 2013-07-07: qty 2

## 2013-07-07 MED ORDER — NALBUPHINE HCL 10 MG/ML IJ SOLN
5.0000 mg | INTRAMUSCULAR | Status: DC | PRN
  Filled 2013-07-07: qty 1

## 2013-07-07 MED ORDER — IBUPROFEN 600 MG PO TABS
600.0000 mg | ORAL_TABLET | Freq: Four times a day (QID) | ORAL | Status: DC | PRN
  Filled 2013-07-07: qty 1

## 2013-07-07 MED ORDER — MEPERIDINE HCL 25 MG/ML IJ SOLN
6.2500 mg | INTRAMUSCULAR | Status: DC | PRN
  Administered 2013-07-07: 6.25 mg via INTRAVENOUS

## 2013-07-07 MED ORDER — LORAZEPAM 1 MG PO TABS
1.0000 mg | ORAL_TABLET | Freq: Two times a day (BID) | ORAL | Status: DC
  Administered 2013-07-08: 1 mg via ORAL
  Filled 2013-07-07 (×2): qty 1

## 2013-07-07 MED ORDER — CEFAZOLIN SODIUM-DEXTROSE 2-3 GM-% IV SOLR
INTRAVENOUS | Status: AC
Start: 2013-07-07 — End: 2013-07-07
  Administered 2013-07-07: 2 g via INTRAVENOUS
  Filled 2013-07-07: qty 50

## 2013-07-07 MED ORDER — ESTRADIOL 0.1 MG/GM VA CREA
TOPICAL_CREAM | VAGINAL | Status: AC
  Filled 2013-07-07: qty 42.5

## 2013-07-07 MED ORDER — ALBUTEROL SULFATE (2.5 MG/3ML) 0.083% IN NEBU: 3.0000 mL | INHALATION_SOLUTION | Freq: Four times a day (QID) | RESPIRATORY_TRACT | Status: DC | PRN

## 2013-07-07 MED ORDER — PROPOFOL INFUSION 10 MG/ML OPTIME
INTRAVENOUS | Status: DC | PRN
  Administered 2013-07-07: 100 ug/kg/min via INTRAVENOUS

## 2013-07-07 MED ORDER — KETOROLAC TROMETHAMINE 30 MG/ML IJ SOLN: 30.0000 mg | Freq: Four times a day (QID) | INTRAMUSCULAR | Status: AC | PRN

## 2013-07-07 MED ORDER — BUPIVACAINE IN DEXTROSE 0.75-8.25 % IT SOLN
INTRATHECAL | Status: DC | PRN
  Administered 2013-07-07 (×2): 1.8 mL via INTRATHECAL

## 2013-07-07 MED ORDER — KETOROLAC TROMETHAMINE 30 MG/ML IJ SOLN
15.0000 mg | Freq: Once | INTRAMUSCULAR | Status: AC | PRN
  Administered 2013-07-07: 30 mg via INTRAVENOUS

## 2013-07-07 MED ORDER — NALOXONE HCL 0.4 MG/ML IJ SOLN: 0.4000 mg | INTRAMUSCULAR | Status: DC | PRN

## 2013-07-07 MED ORDER — LACTATED RINGERS IV SOLN
INTRAVENOUS | Status: DC
  Administered 2013-07-07 (×3): via INTRAVENOUS

## 2013-07-07 MED ORDER — LACTATED RINGERS IV SOLN: INTRAVENOUS | Status: DC

## 2013-07-07 MED ORDER — ROCURONIUM BROMIDE 100 MG/10ML IV SOLN
INTRAVENOUS | Status: AC
  Filled 2013-07-07: qty 1

## 2013-07-07 MED ORDER — MORPHINE SULFATE (PF) 0.5 MG/ML IJ SOLN
INTRAMUSCULAR | Status: DC | PRN
  Administered 2013-07-07: 1 mg via INTRAVENOUS
  Administered 2013-07-07: .2 mg via EPIDURAL
  Administered 2013-07-07: .2 mg via INTRATHECAL
  Administered 2013-07-07: 3.6 mg via INTRAVENOUS

## 2013-07-07 MED ORDER — FENTANYL CITRATE 0.05 MG/ML IJ SOLN
INTRAMUSCULAR | Status: AC
Start: 2013-07-07 — End: 2013-07-07
  Filled 2013-07-07: qty 5

## 2013-07-07 MED ORDER — NALOXONE HCL 1 MG/ML IJ SOLN
1.0000 ug/kg/h | INTRAVENOUS | Status: DC | PRN
  Filled 2013-07-07: qty 2

## 2013-07-07 MED ORDER — ZOLPIDEM TARTRATE 5 MG PO TABS: 5.0000 mg | ORAL_TABLET | Freq: Every evening | ORAL | Status: DC | PRN

## 2013-07-07 MED ORDER — SCOPOLAMINE 1 MG/3DAYS TD PT72
1.0000 | MEDICATED_PATCH | Freq: Once | TRANSDERMAL | Status: DC
  Administered 2013-07-07: 1.5 mg via TRANSDERMAL

## 2013-07-07 MED ORDER — MORPHINE SULFATE (PF) 0.5 MG/ML IJ SOLN: INTRAMUSCULAR | Status: DC | PRN

## 2013-07-07 MED ORDER — NEOSTIGMINE METHYLSULFATE 1 MG/ML IJ SOLN
INTRAMUSCULAR | Status: AC
  Filled 2013-07-07: qty 1

## 2013-07-07 SURGICAL SUPPLY — 27 items
BLADE SURG 10 STRL SS (BLADE) ×12 IMPLANT
CANISTER SUCT 3000ML (MISCELLANEOUS) ×4 IMPLANT
CLOTH BEACON ORANGE TIMEOUT ST (SAFETY) ×4 IMPLANT
CONT PATH 16OZ SNAP LID 3702 (MISCELLANEOUS) IMPLANT
DECANTER SPIKE VIAL GLASS SM (MISCELLANEOUS) IMPLANT
DRSG TELFA 3X8 NADH (GAUZE/BANDAGES/DRESSINGS) IMPLANT
GAUZE PACKING 2X5 YD STRL (GAUZE/BANDAGES/DRESSINGS) ×4 IMPLANT
GLOVE BIOGEL PI IND STRL 6.5 (GLOVE) ×2 IMPLANT
GLOVE BIOGEL PI IND STRL 7.0 (GLOVE) ×2 IMPLANT
GLOVE BIOGEL PI INDICATOR 6.5 (GLOVE) ×2
GLOVE BIOGEL PI INDICATOR 7.0 (GLOVE) ×2
GLOVE ECLIPSE 7.0 STRL STRAW (GLOVE) ×8 IMPLANT
GOWN STRL REUS W/TWL LRG LVL3 (GOWN DISPOSABLE) ×20 IMPLANT
NEEDLE HYPO 22GX1.5 SAFETY (NEEDLE) IMPLANT
NEEDLE SPNL 18GX3.5 QUINCKE PK (NEEDLE) ×4 IMPLANT
NS IRRIG 1000ML POUR BTL (IV SOLUTION) ×4 IMPLANT
PACK VAGINAL WOMENS (CUSTOM PROCEDURE TRAY) ×4 IMPLANT
PAD OB MATERNITY 4.3X12.25 (PERSONAL CARE ITEMS) ×4 IMPLANT
SUT VIC AB 0 CT1 18XCR BRD8 (SUTURE) ×6 IMPLANT
SUT VIC AB 0 CT1 27 (SUTURE) ×4
SUT VIC AB 0 CT1 27XBRD ANBCTR (SUTURE) ×4 IMPLANT
SUT VIC AB 0 CT1 8-18 (SUTURE) ×6
SUT VICRYL 0 TIES 12 18 (SUTURE) ×4 IMPLANT
SYR 20CC LL (SYRINGE) ×4 IMPLANT
TOWEL OR 17X24 6PK STRL BLUE (TOWEL DISPOSABLE) ×8 IMPLANT
TRAY FOLEY CATH 14FR (SET/KITS/TRAYS/PACK) ×4 IMPLANT
WATER STERILE IRR 1000ML POUR (IV SOLUTION) ×4 IMPLANT

## 2013-07-07 NOTE — Progress Notes (Signed)
Ur chart review completed.  

## 2013-07-07 NOTE — Interval H&P Note (Signed)
History and Physical Interval Note:  07/07/2013 8:44 AM  Monica Chase  has presented today for surgery, with the diagnosis of  Uterine fibroids and menorrhagia  The various methods of treatment have been discussed with the patient and family. After consideration of risks, benefits and other options for treatment, the patient has consented to  Procedure(s): HYSTERECTOMY VAGINAL (N/A) BILATERAL SALPINGECTOMY (Bilateral) as a surgical intervention .  The patient's history has been reviewed, patient examined, no change in status, stable for surgery.  I have reviewed the patient's chart and labs.  Questions were answered to the patient's satisfaction.     [place]

## 2013-07-07 NOTE — Transfer of Care (Signed)
Immediate Anesthesia Transfer of Care Note  Patient: Monica Chase  Procedure(s) Performed: Procedure(s): HYSTERECTOMY VAGINAL (N/A) BILATERAL SALPINGECTOMY (Bilateral)  Patient Location: PACU  Anesthesia Type:MAC combined with regional for post-op pain  Level of Consciousness: awake, alert  and oriented  Airway & Oxygen Therapy: Patient Spontanous Breathing and Patient connected to nasal cannula oxygen  Post-op Assessment: Report given to PACU RN and Post -op Vital signs reviewed and stable  Post vital signs: Reviewed and stable  Complications: No apparent anesthesia complications

## 2013-07-07 NOTE — Op Note (Signed)
Preoperative diagnosis: fibroid uterus, pelvic pain, abnormal bleeding  Postoperative diagnosis: Same  Procedure: Transvaginal hysterectomy, bilateral salpingectomy  Surgeon: Standley Dakins. Kennon Rounds, M.D.  Assistant: Silas Sacramento, MD  Anesthesia: Spinal and IV sedation-Hatchett, MD  Findings: Enlarged uterus, degenerating fibroid.  Estimated blood loss: 650 cc  Specimen: Uterus, tubes to pathology  Reason for procedure: Pt. Presented to [facility] with acute pain and found to have degenerating fibroids.  Pain did not remit and EMB was negative.  She continues to have abnormal bleeding. The patient desired definitive treatment.  Risks of  hysterectomy reviewed.  Risks include but are not limited to bleeding, infection, injury to surrounding structures, including bowel, bladder and ureters, blood clots, and death.  Likelihood of success of surgery is high.   Procedure: Patient was taken to the OR where she was placed in dorsal lithotomy in [place]. She was prepped and draped in the usual sterile fashion. A timeout was performed. The patient received 2 g of Ancef prior to procedure. The patient had SCDs in place.  A speculum was placed inside the vagina. The cervix was visualized and grasped with 2 doublle-tooth tenacula. 30 cc of 1% lidocaine with epinephrine were injected paracervically. A knife was used to make a circumferential incision around the vagina. An opened sponge was used to dissect the vagina off the cervix. The posterior peritoneum was entered sharply with Mayo scissors. The posterior peritoneum was tagged to the vaginal cuff with a single stitch. The anterior peritoneal cavity could not be entered with confidence.  The bladder was pushed up with careful dissection of the bladder off the underlying cervix. A Heaney clamp was used to clamp first the left uterosacral ligament and cardinal which was then cut and Haney suture ligated with 0 Vicryl stitch, the stitch was held. Similarly the right  uterosacral ligament was clamped cut and suture ligated. Sequential bites up the broad to the uterine arteries were taken.  The uterus was noted to be large and was morcellated using a coring method, plus removal of tissue.  Once the tubo-ovarian pedicles were encountered, they were suture ligated. The anterior peritoneum was then entered and the remaining tubo-ovarian pedicles were ligated. There was some bleeding encountered after removal of the uterus, and further clamping was needed to achieve hemostasis, bilaterally.  The tubes were identified bilaterally and removed and secured with a free tie. Inspection of all pedicles revealed adequate hemostasis. There was some bleeding noted at the vaginal cuff on the right side. The vagina was closed with 0 Vicryl suture in a locked running fashion with care taken to incorporate the uterosacral pedicles. Excellent hemostasis was noted at the end of the case. The vaginal cuff was inspected there was minimal bleeding noted. A vaginal packing was placed with Estrace cream.  A Foley catheter is placed inside her bladder. Clear, yellow urine was noted. All instrument needle and lap counts were correct x 2. Patient was awakened taken to recovery room in stable condition.  Donnamae Jude, MD 07/07/2013, 11:50 AM

## 2013-07-07 NOTE — Anesthesia Postprocedure Evaluation (Signed)
  Anesthesia Post-op Note  Patient: SHANITRA PHILLIPPI  Procedure(s) Performed: Procedure(s): HYSTERECTOMY VAGINAL (N/A) BILATERAL SALPINGECTOMY (Bilateral)  Patient Location: PACU and Women's Unit  Anesthesia Type:Spinal  Level of Consciousness: sedated  Airway and Oxygen Therapy: Patient Spontanous Breathing and Patient connected to nasal cannula oxygen  Post-op Pain: none  Post-op Assessment: Post-op Vital signs reviewed and No signs of Nausea or vomiting  Post-op Vital Signs: Reviewed and stable  Complications: No apparent anesthesia complications  Anesthesia Post Note  Patient: CARALYNN GELBER  Procedure(s) Performed: Procedure(s) (LRB): HYSTERECTOMY VAGINAL (N/A) BILATERAL SALPINGECTOMY (Bilateral)  Anesthesia type: Spinal  Patient location: Mother/Baby  Post pain: Pain level controlled  Post assessment: Post-op Vital signs reviewed  Last Vitals:  Filed Vitals:   07/07/13 1535  BP: 106/61  Pulse: 79  Temp:   Resp: 16    Post vital signs: Reviewed  Level of consciousness: awake  Complications: No apparent anesthesia complications

## 2013-07-07 NOTE — Discharge Instructions (Signed)
Vaginal Hysterectomy, Care After °Refer to this sheet in the next few weeks. These instructions provide you with information on caring for yourself after your procedure. Your health care provider may also give you more specific instructions. Your treatment has been planned according to current medical practices, but problems sometimes occur. Call your health care provider if you have any problems or questions after your procedure. °WHAT TO EXPECT AFTER THE PROCEDURE °After your procedure, it is typical to have the following: °· Abdominal pain. You will be given pain medicine to control it. °· Sore throat from the breathing tube that was inserted during surgery. °HOME CARE INSTRUCTIONS °· Only take over-the-counter or prescription medicines for pain, discomfort, or fever as directed by your health care provider. °· Do not take aspirin. It can cause bleeding. °· Do not drive when taking pain medicine. °· Follow your health care provider's advice regarding diet, exercise, lifting, driving, and general activities. °· Resume your usual diet as directed and allowed. °· Get plenty of rest and sleep. °· Do not douche, use tampons, or have sexual intercourse for at least 6 weeks, or until your health care provider gives you permission. °· Change your bandages (dressings) as directed by your health care provider. °· Monitor your temperature and notify your health care provider of a fever. °· Take showers instead of baths for 2 3 weeks. °· Do not drink alcohol until your health care provider gives you permission. °· If you develop constipation, you may take a mild laxative with your health care provider's permission. Bran foods may help with constipation problems. Drinking enough fluids to keep your urine clear or pale yellow may help as well. °· Try to have someone home with you for 1 2 weeks to help around the house. °· Keep all of your follow-up appointments as directed by your health care provider. °SEEK MEDICAL CARE IF:    °· You have swelling, redness, or increasing pain around your incision sites. °· You have pus coming from your incision. °· You notice a bad smell coming from your incision. °· Your incision breaks open. °· You feel dizzy or lightheaded. °· You have pain or bleeding when you urinate. °· You have persistent diarrhea. °· You have persistent nausea and vomiting. °· You have abnormal vaginal discharge. °· You have a rash. °· You have any type of abnormal reaction or develop an allergy to your medicine. °· You have poor pain control with your prescribed medicine. °SEEK IMMEDIATE MEDICAL CARE IF:  °· You have a fever. °· You have severe abdominal pain. °· You have chest pain. °· You have shortness of breath. °· You faint. °· You have pain, swelling, or redness in your leg. °· You have heavy vaginal bleeding with blood clots. °Document Released: 05/04/2011 Document Revised: 01/15/2013 Document Reviewed: 11/28/2012 °[employer]® Patient Information ©2014 [employer]. ° °

## 2013-07-07 NOTE — Anesthesia Postprocedure Evaluation (Signed)
  Anesthesia Post Note  Patient: Monica Chase  Procedure(s) Performed: Procedure(s) (LRB): HYSTERECTOMY VAGINAL (N/A) BILATERAL SALPINGECTOMY (Bilateral)  Anesthesia type: Spinal  Patient location: PACU  Post pain: Pain level controlled  Post assessment: Post-op Vital signs reviewed  Last Vitals:  Filed Vitals:   07/07/13 1315  BP:   Pulse:   Temp: 36.8 C  Resp:     Post vital signs: Reviewed  Level of consciousness: awake  Complications: No apparent anesthesia complications

## 2013-07-07 NOTE — Anesthesia Procedure Notes (Addendum)
Spinal  Patient location during procedure: OR Start time: 07/07/2013 9:29 AM End time: 07/07/2013 9:32 AM Staffing Anesthesiologist: Lyn Hollingshead Performed by: anesthesiologist  Preanesthetic Checklist Completed: patient identified, surgical consent, pre-op evaluation, timeout performed, IV checked, risks and benefits discussed and monitors and equipment checked Spinal Block Patient position: sitting Prep: DuraPrep Patient monitoring: heart rate, cardiac monitor, continuous pulse ox and blood pressure Approach: midline Location: L3-4 Injection technique: single-shot Needle Needle type: Sprotte  Needle gauge: 24 G Needle length: 9 cm Needle insertion depth: 5 cm Assessment Sensory level: T8  Spinal  Patient location during procedure: OR Start time: 07/07/2013 9:45 AM End time: 07/07/2013 9:50 AM Staffing Anesthesiologist: Lyn Hollingshead Performed by: anesthesiologist  Preanesthetic Checklist Completed: patient identified, surgical consent, pre-op evaluation, timeout performed, IV checked, risks and benefits discussed and monitors and equipment checked Spinal Block Patient position: sitting Prep: DuraPrep Patient monitoring: heart rate, cardiac monitor, continuous pulse ox and blood pressure Approach: midline Location: L3-4 Injection technique: single-shot Needle Needle type: Sprotte  Needle gauge: 24 G Needle length: 9 cm Needle insertion depth: 7 cm Assessment Sensory level: T6 Additional Notes Sensory level not sufficient with first spinal.

## 2013-07-07 NOTE — Anesthesia Preprocedure Evaluation (Signed)
Anesthesia Evaluation  Patient identified by MRN, date of birth, ID band Patient awake    Reviewed: Allergy & Precautions, H&P , NPO status , Patient's Chart, lab work & pertinent test results  Airway Mallampati: I TM Distance: >3 FB Neck ROM: full    Dental no notable dental hx. (+) Teeth Intact   Pulmonary former smoker,          Cardiovascular negative cardio ROS      Neuro/Psych PSYCHIATRIC DISORDERS Depression negative neurological ROS     GI/Hepatic negative GI ROS, Neg liver ROS,   Endo/Other    Renal/GU negative Renal ROS     Musculoskeletal   Abdominal Normal abdominal exam  (+)   Peds  Hematology negative hematology ROS (+)   Anesthesia Other Findings   Reproductive/Obstetrics negative OB ROS                           Anesthesia Physical Anesthesia Plan  ASA: II  Anesthesia Plan: Spinal   Post-op Pain Management:    Induction:   Airway Management Planned:   Additional Equipment:   Intra-op Plan:   Post-operative Plan:   Informed Consent: I have reviewed the patients History and Physical, chart, labs and discussed the procedure including the risks, benefits and alternatives for the proposed anesthesia with the patient or authorized representative who has indicated his/her understanding and acceptance.     Plan Discussed with: CRNA and Surgeon  Anesthesia Plan Comments:         Anesthesia Quick Evaluation

## 2013-07-08 ENCOUNTER — Encounter (HOSPITAL_COMMUNITY): Payer: Self-pay | Admitting: Family Medicine

## 2013-07-08 LAB — CBC
HEMATOCRIT: 24.5 % — AB (ref 36.0–46.0)
HEMATOCRIT: 22.7 % — AB (ref 36.0–46.0)
Hemoglobin: 8.5 g/dL — ABNORMAL LOW (ref 12.0–15.0)
Hemoglobin: 7.9 g/dL — ABNORMAL LOW (ref 12.0–15.0)
MCH: 29.1 pg (ref 26.0–34.0)
MCH: 29.3 pg (ref 26.0–34.0)
MCHC: 34.7 g/dL (ref 30.0–36.0)
MCHC: 34.8 g/dL (ref 30.0–36.0)
MCV: 83.9 fL (ref 78.0–100.0)
MCV: 84.1 fL (ref 78.0–100.0)
Platelets: 305 10*3/uL (ref 150–400)
Platelets: 287 10*3/uL (ref 150–400)
RBC: 2.92 MIL/uL — ABNORMAL LOW (ref 3.87–5.11)
RBC: 2.7 MIL/uL — ABNORMAL LOW (ref 3.87–5.11)
RDW: 12.9 % (ref 11.5–15.5)
RDW: 13 % (ref 11.5–15.5)
WBC: 11.4 10*3/uL — ABNORMAL HIGH (ref 4.0–10.5)
WBC: 12.3 10*3/uL — ABNORMAL HIGH (ref 4.0–10.5)

## 2013-07-08 NOTE — Significant Event (Signed)
Rapid Response Event Note  Overview: Time Called: 0737 Arrival Time: 0555 Event Type: Hypotension  Initial Focused Assessment:   Interventions:   Event Summary: Name of Physician Notified: Dr. Elly Modena at [phone]    at    Outcome: Stayed in room and stabalized  Event End Time: 0610  Jackson Latino

## 2013-07-08 NOTE — Telephone Encounter (Signed)
Supplies have been ordered for patient.

## 2013-07-08 NOTE — Progress Notes (Signed)
1 Day Post-Op Procedure(s) (LRB): HYSTERECTOMY VAGINAL (N/A) BILATERAL SALPINGECTOMY (Bilateral)  Subjective: Patient reports tolerating PO and + flatus.  2 syncopal episodes with standing.  Objective: I have reviewed patient's vital signs, intake and output, medications and labs. BP 113/53  Pulse 65  Temp(Src) 97.6 F (36.4 C) (Oral)  Resp 18  Ht 5\' 11"  (1.803 m)  Wt 212 lb (96.163 kg)  BMI 29.58 kg/m2  SpO2 100%  LMP 06/18/2013 UOP adequate General: alert, cooperative and appears stated age GI: normal findings: bowel sounds normal and abnormal findings:  distended and moderate tenderness in the lower abdomen Vaginal Bleeding: minimal CBC    Component Value Date/Time   WBC 11.4* 07/08/2013 0530   RBC 2.92* 07/08/2013 0530   HGB 8.5* 07/08/2013 0530   HCT 24.5* 07/08/2013 0530   PLT 305 07/08/2013 0530   MCV 83.9 07/08/2013 0530   MCH 29.1 07/08/2013 0530   MCHC 34.7 07/08/2013 0530   RDW 12.9 07/08/2013 0530   LYMPHSABS 2.5 06/19/2013 0109   MONOABS 0.6 06/19/2013 0109   EOSABS 0.1 06/19/2013 0109   BASOSABS 0.0 06/19/2013 0109     Assessment: s/p Procedure(s): HYSTERECTOMY VAGINAL (N/A) BILATERAL SALPINGECTOMY (Bilateral): tolerating diet, anemia and syncope-possibly related to blood loss.  Plan: Advance diet Encourage ambulation repeat cbc today Ambulation with assistance only  LOS: 1 day    Gautham Hewins S 07/08/2013, 8:03 AM

## 2013-07-08 NOTE — Progress Notes (Signed)
1300 pt voided 58mL. 1645 I checked with pt to see if she had voided and she stated "no, I have not". I noted that there was yellow tinged fluid in the commode. 1645 I spoke with Dr. Kennon Rounds, pt was bladder scanned and 150mL was found to be in the bladder. Dr. Kennon Rounds notified of pt's inability to void, discharge was cancelled for this evening.

## 2013-07-08 NOTE — Progress Notes (Signed)
Upon removing patient's packing and foley this am, patient stated she wanted to walk into the bathroom and clean herself up. Vital signs were taken at this time and patient was dangled on side of bed before ambulation. Patient ambulated to bathroom and then stated she was "starting to feel dizzy again." Patient was immediately lowered to toilet, and slowly started to lower herself back towards the wall where her eyes then began to roll in the back of her head. Patient's name was shouted and was shook and patient immediately came to in less than 10 seconds. Patient asked what happened, and I explained the she had passed out. Rapid response was called because this was patient's second time passing out (prior to first time when patient first attempted to ambulate around 1630 according to day shift RN). Rapid response arrived along with  house coverage, Diona Browner, and helped put patient back to bed. On call MD, Dr. Elly Modena  was paged and notified of incident, and stated she would pass this info along to Dr. Kennon Rounds. Will continue to monitor patient.

## 2013-07-08 NOTE — Significant Event (Signed)
Rapid Response Event Note  Syncopal episode while in BR, quickly regained consciousness, no fall.  RN at pts side entire time.    Overview: Time Called: 2979 Arrival Time: 0555 Event Type: Hypotension  Initial Focused Assessment:  Pt alert and oriented in BR, able to stand and transfer to wheelchair - back to bed, stood with no c/o and back to bed.  VS obtained - stable.  BP 113/63, p 73.  Good color, denies further syncope - states "I am just scared".   IV fluids hung - LR inf at 130ml.   H/H 8.5/29.1.  Report to Dr. Elly Modena by C. Jodi Mourning, RN.     Interventions:  IV fluid bolus, reevaluate.  Pt remains in room, VSS.     Event Summary:   at      at          [facility], Edmund Hilda

## 2013-07-08 NOTE — Progress Notes (Signed)
Called this morning by RN informing me of syncopal episodes that occurred yesterday afternoon and this morning. Patient was found lying comfortably in bed. Appropriately concerned about these syncopal episodes. At this point she is afraid to ambulate to the bathroom for fear of passing out and is afraid to be discharged home today. She denies any cardiovascular history. This is the first time she has ever experienced this.  All vital signs reviewed, including urine output which are all within normal range. Hg was 13 pre-op and this morning 8.5. Advised patient to seek assistance of nurse to ambulate to the bathroom. This may be due to Percocet as patient has never taken it in the past and does not like the way it is making her feel. He is taking 1/2 tablet at this point.  Abd: Soft, appropriately tender, ND Pelvic: no active vaginal bleeding EXT: NT, equal in size  Case was discussed with Dr. Kennon Rounds who is planning on seeing the patient shortly

## 2013-07-08 NOTE — Progress Notes (Signed)
Patient's vaginal packing removed. Scant drainage, no clots present. Patient tolerated well.

## 2013-07-09 MED ORDER — POLYETHYLENE GLYCOL 3350 17 G PO PACK: 17.0000 g | PACK | Freq: Every day | ORAL | Status: DC | PRN

## 2013-07-09 MED ORDER — BUTALBITAL-APAP-CAFFEINE 50-325-40 MG PO TABS
2.0000 | ORAL_TABLET | Freq: Once | ORAL | Status: AC
  Administered 2013-07-09: 2 via ORAL
  Filled 2013-07-09: qty 2

## 2013-07-09 MED ORDER — FE FUM-VIT C-VIT B12-FA 460-60-0.01-1 MG PO CAPS: 1.0000 | ORAL_CAPSULE | Freq: Two times a day (BID) | ORAL | Status: DC

## 2013-07-09 MED ORDER — BUTALBITAL-APAP-CAFFEINE 50-500-40 MG PO TABS: 1.0000 | ORAL_TABLET | ORAL | Status: DC | PRN

## 2013-07-09 NOTE — Progress Notes (Signed)
Patient ID: Monica Chase, female   DOB: 02/15/1976, 38 y.o.   MRN: [id number] Called about positional HA in pt.  Pt was given Fioricet.  Her sx improved and per the nurse she is ready for discharge.  WIll write order for Fioricet for home.  Demetris Meinhardt L. Ihor Dow, M.D., Cherlynn June,

## 2013-07-09 NOTE — Progress Notes (Addendum)
Medication relived patients headache.  Discharge instructions given to patient and family member at bedside.  Activity, follow up appointments, medications, when to call the doctor and community resources discussed.  Prescriptions moved to [place] per patient request.  Patient left unit in wheelchair in stable condition with all personal belongings and prescription accompanied by staff.  Leighton Roach, RN-------------

## 2013-07-09 NOTE — Discharge Summary (Signed)
Gynecology Physician Discharge Summary  Patient ID: GEOFFREY NEDVED MRN: [id number] DOB/AGE: 1976/03/13 38 y.o.  Admit date: 07/07/2013 Discharge date: 07/09/2013  Preoperative Diagnoses: fibroid uterus, pelvic pain and heavy vaginal bleeding.  Procedures: Procedure(s) (LRB): HYSTERECTOMY VAGINAL (N/A) BILATERAL SALPINGECTOMY (Bilateral)  Results for orders placed during the hospital encounter of 07/07/13 (from the past 336 hour(s))  PREGNANCY, URINE   Collection Time    07/07/13  8:30 AM      Result Value Ref Range   Preg Test, Ur NEGATIVE  NEGATIVE  CBC   Collection Time    07/08/13  5:30 AM      Result Value Ref Range   WBC 11.4 (*) 4.0 - 10.5 K/uL   RBC 2.92 (*) 3.87 - 5.11 MIL/uL   Hemoglobin 8.5 (*) 12.0 - 15.0 g/dL   HCT 24.5 (*) 36.0 - 46.0 %   MCV 83.9  78.0 - 100.0 fL   MCH 29.1  26.0 - 34.0 pg   MCHC 34.7  30.0 - 36.0 g/dL   RDW 12.9  11.5 - 15.5 %   Platelets 305  150 - 400 K/uL  CBC   Collection Time    07/08/13 12:01 PM      Result Value Ref Range   WBC 12.3 (*) 4.0 - 10.5 K/uL   RBC 2.70 (*) 3.87 - 5.11 MIL/uL   Hemoglobin 7.9 (*) 12.0 - 15.0 g/dL   HCT 22.7 (*) 36.0 - 46.0 %   MCV 84.1  78.0 - 100.0 fL   MCH 29.3  26.0 - 34.0 pg   MCHC 34.8  30.0 - 36.0 g/dL   RDW 13.0  11.5 - 15.5 %   Platelets 287  150 - 400 K/uL  Results for orders placed during the hospital encounter of 07/03/13 (from the past 336 hour(s))  CBC   Collection Time    07/03/13  9:45 AM      Result Value Ref Range   WBC 5.9  4.0 - 10.5 K/uL   RBC 4.66  3.87 - 5.11 MIL/uL   Hemoglobin 13.7  12.0 - 15.0 g/dL   HCT 39.4  36.0 - 46.0 %   MCV 84.5  78.0 - 100.0 fL   MCH 29.4  26.0 - 34.0 pg   MCHC 34.8  30.0 - 36.0 g/dL   RDW 13.1  11.5 - 15.5 %   Platelets 332  150 - 400 K/uL    Hospital Course:  ANJALIE AMBROISE is a 38 y.o. [id number]  admitted for scheduled surgery.  She underwent the procedures as mentioned above, her operation was uncomplicated. For further details about  surgery, please refer to the operative report. Patient had a  postoperative course complicated by drop in hgb and several syncopal episodes.  Her vitals remained stable and UOP was excellent.  Discussion regarding risks and benefits of transfusion were had and it was decided not to transfuse her at this time. By time of discharge on POD#2, her pain was controlled on oral pain medications; she was ambulating with some mild dizziness, voiding without difficulty, tolerating regular diet and passing flatus. She was deemed stable for discharge to home.   Discharge Exam: Blood pressure 115/52, pulse 95, temperature 98.4 F (36.9 C), temperature source Oral, resp. rate 18, height 5\' 11"  (1.803 m), weight 212 lb (96.163 kg), last menstrual period 06/18/2013, SpO2 93.00%. General appearance: alert and no distress  GI: soft, appropriately tender, mildly distended; bowel sounds normal; no masses, no organomegaly.  Pelvic: scant  blood on pad  Extremities: extremities normal, atraumatic, no cyanosis or edema and Homans sign is negative, no sign of DVT  Discharged Condition: Stable  Disposition: 01-Home or Self Care  Discharge Orders   Future Orders Complete By Expires   Call MD for:  difficulty breathing, headache or visual disturbances  As directed    Call MD for:  persistant nausea and vomiting  As directed    Call MD for:  persistant nausea and vomiting  As directed    Call MD for:  redness, tenderness, or signs of infection (pain, swelling, redness, odor or green/yellow discharge around incision site)  As directed    Call MD for:  severe uncontrolled pain  As directed    Call MD for:  severe uncontrolled pain  As directed    Call MD for:  temperature >100.4  As directed    Call MD for:  temperature >100.4  As directed    Diet - low sodium heart healthy  As directed    Increase activity slowly  As directed    Increase activity slowly  As directed    Lifting restrictions  As directed    Comments:      Nothing > 20 lbs   Lifting restrictions  As directed    Comments:     Nothing > 30 lbs   No wound care  As directed    No wound care  As directed    Sexual Activity Restrictions  As directed    Comments:     None until released by MD   Sexual Activity Restrictions  As directed    Comments:     None until released by MD       Medication List         3-IN-1 BEDSIDE TOILET Misc  1 Units by Does not apply route as needed.     Folding Walker/Adult Misc  1 Units by Does not apply route as needed.     albuterol 108 (90 BASE) MCG/ACT inhaler  Commonly known as:  PROVENTIL HFA;VENTOLIN HFA  Inhale 2 puffs into the lungs every 6 (six) hours as needed for wheezing.     Fe Fum-Vit C-Vit B12-FA 460-60-0.01-1 MG Caps capsule  Commonly known as:  TRIGELS-F  Take 1 capsule by mouth 2 (two) times daily.     ibuprofen 600 MG tablet  Commonly known as:  ADVIL,MOTRIN  Take 1 tablet (600 mg total) by mouth every 8 (eight) hours as needed for moderate pain (Take with food).     IRON PO  Take 1 tablet by mouth daily.     LORazepam 1 MG tablet  Commonly known as:  ATIVAN  Take one tab po up to twice daily for anxiety attacks and before bed for problems sleeping     ONE-A-DAY WOMENS PO  Take 1 tablet by mouth daily.     oxyCODONE-acetaminophen 5-325 MG per tablet  Commonly known as:  PERCOCET/ROXICET  Take 1-2 tablets by mouth every 6 (six) hours as needed.     polyethylene glycol packet  Commonly known as:  MIRALAX / GLYCOLAX  Take 17 g by mouth daily as needed for mild constipation.           Follow-up Information   Follow up with [facility] In 2 weeks.   Specialty:  Obstetrics and Gynecology   Contact information:   [address] [place] [address] [phone]  Signed:  Donnamae Jude 07/09/2013 8:12 AM

## 2013-07-09 NOTE — Progress Notes (Signed)
Called to room by patient stated "When I get up my head is throbbing and heart is just pounding.  After I sit down it takes awhile before it comes down but I am still dizzy".  Obtained blood pressure and placed in section.  Doctor notified per patient request.  Will continue to monitor.  Leighton Roach, RN------------------------

## 2013-07-14 ENCOUNTER — Telehealth: Payer: Self-pay | Admitting: *Deleted

## 2013-07-14 NOTE — Telephone Encounter (Signed)
Patient called to check on her pathology results.  Results are all benign.  Patient is reassured.

## 2013-08-21 ENCOUNTER — Encounter: Payer: Self-pay | Admitting: Family Medicine

## 2013-08-21 ENCOUNTER — Ambulatory Visit (INDEPENDENT_AMBULATORY_CARE_PROVIDER_SITE_OTHER): Payer: Medicaid Other | Admitting: Family Medicine

## 2013-08-21 VITALS — BP 118/79 | HR 60 | Ht 71.0 in | Wt 214.0 lb

## 2013-08-21 DIAGNOSIS — Z09 Encounter for follow-up examination after completed treatment for conditions other than malignant neoplasm: Secondary | ICD-10-CM

## 2013-08-21 DIAGNOSIS — E0590 Thyrotoxicosis, unspecified without thyrotoxic crisis or storm: Secondary | ICD-10-CM

## 2013-08-21 NOTE — Patient Instructions (Signed)
Hyperthyroidism  The thyroid is a large gland located in the lower front part of your neck. The thyroid helps control metabolism. Metabolism is how your body uses food. It controls metabolism with the hormone thyroxine. When the thyroid is overactive, it produces too much hormone. When this happens, these following problems may occur:   · Nervousness  · Heat intolerance  · Weight loss (in spite of increase food intake)  · Diarrhea  · Change in hair or skin texture  · Palpitations (heart skipping or having extra beats)  · Tachycardia (rapid heart rate)  · Loss of menstruation (amenorrhea)  · Shaking of the hands  CAUSES  · Grave's Disease (the immune system attacks the thyroid gland). This is the most common cause.  · Inflammation of the thyroid gland.  · Tumor (usually benign) in the thyroid gland or elsewhere.  · Excessive use of thyroid medications (both prescription and 'natural').  · Excessive ingestion of Iodine.  DIAGNOSIS   To prove hyperthyroidism, your caregiver may do blood tests and ultrasound tests. Sometimes the signs are hidden. It may be necessary for your caregiver to watch this illness with blood tests, either before or after diagnosis and treatment.  TREATMENT  Short-term treatment  There are several treatments to control symptoms. Drugs called beta blockers may give some relief. Drugs that decrease hormone production will provide temporary relief in many people. These measures will usually not give permanent relief.  Definitive therapy  There are treatments available which can be discussed between you and your caregiver which will permanently treat the problem. These treatments range from surgery (removal of the thyroid), to the use of radioactive iodine (destroys the thyroid by radiation), to the use of antithyroid drugs (interfere with hormone synthesis). The first two treatments are permanent and usually successful. They most often require hormone replacement therapy for life. This is because  it is impossible to remove or destroy the exact amount of thyroid required to make a person euthyroid (normal).  HOME CARE INSTRUCTIONS   See your caregiver if the problems you are being treated for get worse. Examples of this would be the problems listed above.  SEEK MEDICAL CARE IF:  Your general condition worsens.  MAKE SURE YOU:   · Understand these instructions.  · Will watch your condition.  · Will get help right away if you are not doing well or get worse.  Document Released: 05/15/2005 Document Revised: 08/07/2011 Document Reviewed: 09/26/2006  [employer]® Patient Information ©2014 [employer].

## 2013-08-21 NOTE — Progress Notes (Signed)
    Subjective:    Patient ID: Monica Chase is a 38 y.o. female presenting with Routine Post Op  on 08/21/2013  HPI: Here for post op check. S/p TVH, BS and doing well.  Surgery was 6 wks ago. Reports some fatigue and pain in her back with trying to navigate the stairs in her home. Denies vaginal bleeding.  Reports some vaginal discharge on occasion.  Review of Systems  Constitutional: Negative for fever and chills.  Respiratory: Negative for shortness of breath.   Cardiovascular: Negative for chest pain.  Gastrointestinal: Negative for nausea, vomiting and abdominal pain.  Genitourinary: Negative for dysuria.  Skin: Negative for rash.      Objective:    BP 118/79  Pulse 60  Ht 5\' 11"  (1.803 m)  Wt 214 lb (97.07 kg)  BMI 29.86 kg/m2  LMP 06/17/2013 Physical Exam  Constitutional: She is oriented to person, place, and time. She appears well-developed and well-nourished. No distress.  HENT:  Head: Normocephalic and atraumatic.  Eyes: No scleral icterus.  Neck: Neck supple.  Cardiovascular: Normal rate.   Pulmonary/Chest: Effort normal.  Abdominal: Soft.  Neurological: She is alert and oriented to person, place, and time.  Skin: Skin is warm and dry.  Psychiatric: She has a normal mood and affect.        Assessment & Plan:  HYPERTHYROIDISM - Plan: TSH, T3, free, T4, free  Postop check    Return if symptoms worsen or fail to improve.

## 2013-08-21 NOTE — Assessment & Plan Note (Signed)
Check labs 

## 2013-08-22 LAB — T3, FREE: T3, Free: 3.8 pg/mL (ref 2.3–4.2)

## 2013-08-22 LAB — TSH: TSH: 0.021 u[IU]/mL — ABNORMAL LOW (ref 0.350–4.500)

## 2013-08-22 LAB — T4, FREE: FREE T4: 1.24 ng/dL (ref 0.80–1.80)

## 2013-08-25 SURGERY — Surgical Case
Anesthesia: *Unknown

## 2013-08-27 ENCOUNTER — Telehealth: Payer: Self-pay | Admitting: *Deleted

## 2013-08-27 NOTE — Telephone Encounter (Signed)
Message copied by Gerri Spore on Wed Aug 27, 2013  4:19 PM ------      Message from: Donnamae Jude      Created: Fri Aug 22, 2013  8:29 AM       Need repeat thyroid testing--which is borderline in 6-8 wks--please inform pt. ------

## 2013-08-27 NOTE — Telephone Encounter (Signed)
I have tried calling this patient 3 different times.  One time on 3/27, again on 3/30 and today.  The number that I have for this patient is not a working number.  I have called her emergency contact (Venus) and did not get an answer. I will try again tomorrow.

## 2013-09-03 ENCOUNTER — Emergency Department (HOSPITAL_COMMUNITY): Admission: EM | Admit: 2013-09-03 | Discharge: 2013-09-03 | Payer: Self-pay | Source: Home / Self Care

## 2013-09-03 ENCOUNTER — Ambulatory Visit (INDEPENDENT_AMBULATORY_CARE_PROVIDER_SITE_OTHER): Payer: Medicaid Other | Admitting: Family Medicine

## 2013-09-03 VITALS — BP 120/81 | HR 65 | Temp 97.9°F | Ht 71.0 in | Wt 213.0 lb

## 2013-09-03 DIAGNOSIS — J329 Chronic sinusitis, unspecified: Secondary | ICD-10-CM

## 2013-09-03 MED ORDER — IPRATROPIUM BROMIDE 0.06 % NA SOLN: 2.0000 | Freq: Four times a day (QID) | NASAL | Status: DC

## 2013-09-03 MED ORDER — DOXYCYCLINE HYCLATE 100 MG PO TABS: 100.0000 mg | ORAL_TABLET | Freq: Two times a day (BID) | ORAL | Status: DC

## 2013-09-03 MED ORDER — FLUTICASONE PROPIONATE 50 MCG/ACT NA SUSP: 2.0000 | Freq: Every day | NASAL | Status: DC

## 2013-09-03 NOTE — Assessment & Plan Note (Signed)
Given worsening symptoms, sinus pressure, dizziness and nasal drainage, will treat for sinusitis. Patient allergic to penicillin so will treat with doxycycline.  rx for ipratropium for nasal congestion.  - once acute symptoms resolved, recommended that she start taking flonase for chronic allergy treatment.

## 2013-09-03 NOTE — Progress Notes (Signed)
Patient ID: [name]    DOB: 17-Jul-1975, 38 y.o.   MRN: [id number] --- Subjective:  Monica Chase is a 38 y.o.female who presents with sinus headache and pressure, dizziness and nasal congestion.  Started 3 days ago, getting worst. Feeling of pressure in frontal sinuses worst in the morning and with movement of head. Mild productive cough of yellow phlegm. Nasal congestion and discharge. Dizziness feels like she is on a a boat when she walks. No sick contacts. Has a history of sinus infections and this feels similar.   ROS: see HPI Past Medical History: reviewed and updated medications and allergies. Social History: Tobacco: none  Objective: Filed Vitals:   09/03/13 1152  BP: 120/81  Pulse: 65  Temp: 97.9 F (36.6 C)    Physical Examination:   General appearance - alert, tired appearing, but in no acute distress Ears - bilateral TM's and external ear canals normal Nose - erythematous and congested nasal turbinates bilaterally with some discharge from left nare, frontal sinus tenderness bilaterally Neuro - CN2-12 grossly intact, normal grip strength Mouth - oropharynx clear with postnasal drip present Chest - clear to auscultation, no wheezes, rales or rhonchi, symmetric air entry Heart - normal rate, regular rhythm, normal S1, S2, no murmurs

## 2013-09-03 NOTE — Patient Instructions (Signed)
Take the ipratropium spray for the nasal congestion now. Take the flonase when your symptoms get better.   Sinusitis Sinusitis is redness, soreness, and swelling (inflammation) of the paranasal sinuses. Paranasal sinuses are air pockets within the bones of your face (beneath the eyes, the middle of the forehead, or above the eyes). In healthy paranasal sinuses, mucus is able to drain out, and air is able to circulate through them by way of your nose. However, when your paranasal sinuses are inflamed, mucus and air can become trapped. This can allow bacteria and other germs to grow and cause infection. Sinusitis can develop quickly and last only a short time (acute) or continue over a long period (chronic). Sinusitis that lasts for more than 12 weeks is considered chronic.  CAUSES  Causes of sinusitis include:  Allergies.  Structural abnormalities, such as displacement of the cartilage that separates your nostrils (deviated septum), which can decrease the air flow through your nose and sinuses and affect sinus drainage.  Functional abnormalities, such as when the small hairs (cilia) that line your sinuses and help remove mucus do not work properly or are not present. SYMPTOMS  Symptoms of acute and chronic sinusitis are the same. The primary symptoms are pain and pressure around the affected sinuses. Other symptoms include:  Upper toothache.  Earache.  Headache.  Bad breath.  Decreased sense of smell and taste.  A cough, which worsens when you are lying flat.  Fatigue.  Fever.  Thick drainage from your nose, which often is green and may contain pus (purulent).  Swelling and warmth over the affected sinuses. DIAGNOSIS  Your caregiver will perform a physical exam. During the exam, your caregiver may:  Look in your nose for signs of abnormal growths in your nostrils (nasal polyps).  Tap over the affected sinus to check for signs of infection.  View the inside of your sinuses  (endoscopy) with a special imaging device with a light attached (endoscope), which is inserted into your sinuses. If your caregiver suspects that you have chronic sinusitis, one or more of the following tests may be recommended:  Allergy tests.  Nasal culture A sample of mucus is taken from your nose and sent to a lab and screened for bacteria.  Nasal cytology A sample of mucus is taken from your nose and examined by your caregiver to determine if your sinusitis is related to an allergy. TREATMENT  Most cases of acute sinusitis are related to a viral infection and will resolve on their own within 10 days. Sometimes medicines are prescribed to help relieve symptoms (pain medicine, decongestants, nasal steroid sprays, or saline sprays).  However, for sinusitis related to a bacterial infection, your caregiver will prescribe antibiotic medicines. These are medicines that will help kill the bacteria causing the infection.  Rarely, sinusitis is caused by a fungal infection. In theses cases, your caregiver will prescribe antifungal medicine. For some cases of chronic sinusitis, surgery is needed. Generally, these are cases in which sinusitis recurs more than 3 times per year, despite other treatments. HOME CARE INSTRUCTIONS   Drink plenty of water. Water helps thin the mucus so your sinuses can drain more easily.  Use a humidifier.  Inhale steam 3 to 4 times a day (for example, sit in the bathroom with the shower running).  Apply a warm, moist washcloth to your face 3 to 4 times a day, or as directed by your caregiver.  Use saline nasal sprays to help moisten and clean your sinuses.  Take over-the-counter or prescription medicines for pain, discomfort, or fever only as directed by your caregiver. SEEK IMMEDIATE MEDICAL CARE IF:  You have increasing pain or severe headaches.  You have nausea, vomiting, or drowsiness.  You have swelling around your face.  You have vision problems.  You  have a stiff neck.  You have difficulty breathing. MAKE SURE YOU:   Understand these instructions.  Will watch your condition.  Will get help right away if you are not doing well or get worse. Document Released: 05/15/2005 Document Revised: 08/07/2011 Document Reviewed: 05/30/2011 [facility] Patient Information 2014 [place], [place].

## 2013-09-24 ENCOUNTER — Telehealth: Payer: Self-pay | Admitting: *Deleted

## 2013-09-24 NOTE — Telephone Encounter (Signed)
Message copied by Gerri Spore on Wed Sep 24, 2013  2:07 PM ------      Message from: Donnamae Jude      Created: Fri Aug 22, 2013  8:29 AM       Need repeat thyroid testing--which is borderline in 6-8 wks--please inform pt. ------

## 2013-09-24 NOTE — Telephone Encounter (Signed)
I finally got in touch with patients emergency contact and she gave me a new telephone number for the patient.  I scheduled patient for an appt on May 14 for labs.

## 2013-10-09 ENCOUNTER — Other Ambulatory Visit: Payer: Self-pay | Admitting: Family Medicine

## 2013-10-09 ENCOUNTER — Other Ambulatory Visit (INDEPENDENT_AMBULATORY_CARE_PROVIDER_SITE_OTHER): Payer: Medicaid Other | Admitting: *Deleted

## 2013-10-09 DIAGNOSIS — E0590 Thyrotoxicosis, unspecified without thyrotoxic crisis or storm: Secondary | ICD-10-CM

## 2013-10-09 DIAGNOSIS — E7800 Pure hypercholesterolemia, unspecified: Secondary | ICD-10-CM

## 2013-10-09 NOTE — Patient Instructions (Signed)

## 2013-10-10 LAB — LIPID PANEL
CHOLESTEROL: 168 mg/dL (ref 0–200)
HDL: 53 mg/dL (ref >39)
LDL Cholesterol: 100 mg/dL — ABNORMAL HIGH (ref 0–99)
Total CHOL/HDL Ratio: 3.2 Ratio
Triglycerides: 73 mg/dL (ref <150)
VLDL: 15 mg/dL (ref 0–40)

## 2013-10-13 LAB — TSH: TSH: 0.036 u[IU]/mL — ABNORMAL LOW (ref 0.350–4.500)

## 2013-10-13 LAB — T3, FREE: T3, Free: 3 pg/mL (ref 2.3–4.2)

## 2013-10-13 LAB — T4, FREE: Free T4: 0.95 ng/dL (ref 0.80–1.80)

## 2013-10-14 ENCOUNTER — Telehealth: Payer: Self-pay | Admitting: *Deleted

## 2013-10-14 NOTE — Telephone Encounter (Signed)
Pt informed and agreeable.  Her previous endocrinologist is Renato Shin, MD so I gave her his number to call.  Pt not sure how long it has been, but thinks it has been a while.  She will call back if a referral is needed. Monica Chase

## 2013-10-14 NOTE — Telephone Encounter (Signed)
Message copied by Bobbye Riggs on Tue Oct 14, 2013 12:14 PM ------      Message from: Donnamae Jude      Created: Tue Oct 14, 2013 11:32 AM       Subclinical hypothyroid--may need to return to endocrinology--please inform pt. She may not go--but offer it to her. ------

## 2013-10-14 NOTE — Telephone Encounter (Signed)
LMOVM for pt to return call. Aysia Lowder D Aniayah Alaniz  

## 2013-10-19 ENCOUNTER — Telehealth: Payer: Self-pay | Admitting: Family Medicine

## 2013-10-19 ENCOUNTER — Encounter (HOSPITAL_COMMUNITY): Payer: Self-pay | Admitting: Emergency Medicine

## 2013-10-19 ENCOUNTER — Emergency Department (HOSPITAL_COMMUNITY)
Admission: EM | Admit: 2013-10-19 | Discharge: 2013-10-19 | Disposition: A | Discharge status: Home / Self Care | Payer: Medicaid Other | Attending: Emergency Medicine | Admitting: Emergency Medicine

## 2013-10-19 DIAGNOSIS — J45909 Unspecified asthma, uncomplicated: Secondary | ICD-10-CM | POA: Insufficient documentation

## 2013-10-19 DIAGNOSIS — Z8639 Personal history of other endocrine, nutritional and metabolic disease: Secondary | ICD-10-CM | POA: Insufficient documentation

## 2013-10-19 DIAGNOSIS — Z880 Allergy status to penicillin: Secondary | ICD-10-CM | POA: Insufficient documentation

## 2013-10-19 DIAGNOSIS — E669 Obesity, unspecified: Secondary | ICD-10-CM | POA: Insufficient documentation

## 2013-10-19 DIAGNOSIS — Z87891 Personal history of nicotine dependence: Secondary | ICD-10-CM | POA: Insufficient documentation

## 2013-10-19 DIAGNOSIS — IMO0002 Reserved for concepts with insufficient information to code with codable children: Secondary | ICD-10-CM | POA: Insufficient documentation

## 2013-10-19 DIAGNOSIS — Z79899 Other long term (current) drug therapy: Secondary | ICD-10-CM | POA: Insufficient documentation

## 2013-10-19 DIAGNOSIS — Z8659 Personal history of other mental and behavioral disorders: Secondary | ICD-10-CM | POA: Insufficient documentation

## 2013-10-19 DIAGNOSIS — Z862 Personal history of diseases of the blood and blood-forming organs and certain disorders involving the immune mechanism: Secondary | ICD-10-CM | POA: Insufficient documentation

## 2013-10-19 DIAGNOSIS — R42 Dizziness and giddiness: Secondary | ICD-10-CM | POA: Insufficient documentation

## 2013-10-19 MED ORDER — MECLIZINE HCL 32 MG PO TABS: 32.0000 mg | ORAL_TABLET | Freq: Three times a day (TID) | ORAL | Status: DC | PRN

## 2013-10-19 MED ORDER — ONDANSETRON HCL 4 MG/2ML IJ SOLN
4.0000 mg | Freq: Once | INTRAMUSCULAR | Status: AC
  Administered 2013-10-19: 4 mg via INTRAVENOUS
  Filled 2013-10-19: qty 2

## 2013-10-19 MED ORDER — DIAZEPAM 5 MG PO TABS: 5.0000 mg | ORAL_TABLET | Freq: Every evening | ORAL | Status: DC | PRN

## 2013-10-19 MED ORDER — MECLIZINE HCL 25 MG PO TABS
25.0000 mg | ORAL_TABLET | Freq: Once | ORAL | Status: AC
  Administered 2013-10-19: 25 mg via ORAL
  Filled 2013-10-19: qty 1

## 2013-10-19 MED ORDER — MECLIZINE HCL 25 MG PO TABS: 25.0000 mg | ORAL_TABLET | Freq: Three times a day (TID) | ORAL | Status: DC | PRN

## 2013-10-19 MED ORDER — DIPHENHYDRAMINE HCL 50 MG/ML IJ SOLN
25.0000 mg | Freq: Once | INTRAMUSCULAR | Status: AC
  Administered 2013-10-19: 25 mg via INTRAVENOUS
  Filled 2013-10-19: qty 1

## 2013-10-19 NOTE — ED Provider Notes (Signed)
CSN: [id number]     Arrival date & time 10/19/13  0730 History   First MD Initiated Contact with Patient 10/19/13 [phone]     Chief Complaint  Patient presents with  . Headache  . Dizziness    Patient is a 38 y.o. female presenting with headaches and dizziness.  Headache Associated symptoms: dizziness and sinus pressure   Associated symptoms: no cough, no ear pain and no fever   Dizziness Associated symptoms: headaches   Associated symptoms: no shortness of breath    38 yo female who presents with dizziness and headache. Patient states that when she woke up this am, she started feeling the room spinning every time she moved her head and laid down. Spinning sensation resolved with position change. No associated vomiting or nausea. No tinnitus or hearing loss. She has had a pressure like headache that started 2 weeks ago. Headache is mild, frontal, constant, not associated with nausea or vomiting. Mildly improved with tylenol. No associated fever. No nasal discharge or congestion.  She has had vertigo in the past but she states that this is more severe. She had been seen at vestibular rehab in 2011 but did not return for follow up visits due to discomfort of symptoms during session.    Past Medical History  Diagnosis Date  . HYPERTHYROIDISM 07/20/2009  . DEPRESSION, MAJOR, RECURRENT 07/26/2006  . ANXIETY 07/26/2006  . ASTHMA, INTERMITTENT 07/26/2006  . HYPERGLYCEMIA 09/28/2009  . CONSTIPATION, CHRONIC 06/22/2010  . Dizziness 11/09/2010  . OBESITY 01/31/2008  . Fibroid    Past Surgical History  Procedure Laterality Date  . Tubal ligation  2008  . Iud removal  10/07    Mirena removed 05/10/2004  . Pilonidal cyst excision    . Vaginal delivery      X 4  . Vaginal hysterectomy N/A 07/07/2013    Procedure: HYSTERECTOMY VAGINAL;  Surgeon: Donnamae Jude, MD;  Location: [facility];  Service: Gynecology;  Laterality: N/A;  . Bilateral salpingectomy Bilateral 07/07/2013    Procedure: BILATERAL  SALPINGECTOMY;  Surgeon: Donnamae Jude, MD;  Location: [facility];  Service: Gynecology;  Laterality: Bilateral;   Family History  Problem Relation Age of Onset  . Hypertension Other    History  Substance Use Topics  . Smoking status: Former Smoker    Quit date: 11/08/2005  . Smokeless tobacco: Never Used  . Alcohol Use: No   OB History   Grav Para Term Preterm Abortions TAB SAB Ect Mult Living   4 4 4       4      Review of Systems  Constitutional: Negative for fever and chills.  HENT: Positive for sinus pressure. Negative for ear pain and rhinorrhea.   Respiratory: Negative for cough and shortness of breath.   Neurological: Positive for dizziness and headaches. Negative for weakness and light-headedness.  Psychiatric/Behavioral: Negative for confusion.      Allergies  Penicillins  Home Medications   Prior to Admission medications   Medication Sig Start Date End Date Taking? Authorizing Provider  albuterol (PROVENTIL HFA;VENTOLIN HFA) 108 (90 BASE) MCG/ACT inhaler Inhale 2 puffs into the lungs every 6 (six) hours as needed for wheezing. 08/05/12   Donnamae Jude, MD  doxycycline (VIBRA-TABS) 100 MG tablet Take 1 tablet (100 mg total) by mouth 2 (two) times daily. Take for 5 days 09/03/13   Kandis Nab, MD  Fe Fum-Vit C-Vit B12-FA (TRIGELS-F) 460-60-0.01-1 MG CAPS capsule Take 1 capsule by mouth 2 (two) times daily.  07/09/13   Donnamae Jude, MD  fluticasone (FLONASE) 50 MCG/ACT nasal spray Place 2 sprays into both nostrils daily. 09/03/13   Kandis Nab, MD  ibuprofen (ADVIL,MOTRIN) 600 MG tablet Take 1 tablet (600 mg total) by mouth every 8 (eight) hours as needed for moderate pain (Take with food). 06/29/13   Donnamae Jude, MD  ipratropium (ATROVENT) 0.06 % nasal spray Place 2 sprays into both nostrils 4 (four) times daily. 09/03/13   Kandis Nab, MD  IRON PO Take 1 tablet by mouth daily.    Historical Provider, MD  Multiple Vitamins-Calcium (ONE-A-DAY WOMENS PO) Take 1  tablet by mouth daily.    Historical Provider, MD  polyethylene glycol (MIRALAX / GLYCOLAX) packet Take 17 g by mouth daily as needed for mild constipation. 07/09/13   Donnamae Jude, MD   BP 110/53  Pulse 72  Temp(Src) 97.8 F (36.6 C) (Oral)  Resp 18  SpO2 100%  LMP 06/17/2013 Physical Exam Physical Examination: General appearance - alert, well appearing, and in no distress Mental status - alert, oriented to person, place, and time Eyes - pupils equal and reactive, extraocular eye movements intact Ears - bilateral TM's and external ear canals normal Nose - erythematous and congested nasal turbinates bilaterally Mouth - mucous membranes moist, pharynx normal without lesions, mildly erythematous oropharynx Neck - supple, no significant adenopathy Chest - clear to auscultation, no wheezes, rales or rhonchi, symmetric air entry Heart - normal rate, regular rhythm, normal S1, S2, no murmurs Extremities - peripheral pulses normal, no pedal edema Skin - no rashes Neuro - CN2-12 grossly intact, EOMI without nystagmus, normal grip strength bilaterally, normal strength in lower extremities, normal finger to nose, normal gait, negative [employer] attempted with instant reproduction of symptoms with movement of head to the left. Unable to assess for nystagmus given patient's difficulty going through procedure. Right side not attempted secondary to patient discomfort.   ED Course  Procedures (including critical care time) Labs Review Labs Reviewed - No data to display  Imaging Review No results found.   EKG Interpretation None      MDM   Final diagnoses:  Vertigo   38 yo female who presents with vertigo likely from BPPV. No focal neuro findings warranting head imaging at this time. - patient treated with IV benadryl 25mg , zofran 4mg  IV and meclizine 25mg  po with mild improvement of symptoms after an hour of administration. Patient stable for discharge with her family member  to drive her home. - Rx for meclizine and valium at night with follow up to ENT.  - return to care if symptoms worsen or fail to resolve  Liam Graham, PGY-3 Family Medicine Resident     Kandis Nab, MD 10/19/13 [phone]

## 2013-10-19 NOTE — ED Provider Notes (Signed)
Pt Discussed with Dr. Otis Dials.  Pt examined face to face.  History reviewed.  Mild imbalance for 1-2 weeks.  Acutely vertiginous this am with spinning /hallucination of movement with any head movement.  Plan is medication trial with benadryl/zofran/meclizine.  PRN valium if not improving.  ENT f/u.  Pt did not tolerate Hall/Pike today.  Will refer to ENT for vestibular Tx if not improving on meds.  Pt had similar episode app 5 years ago.  Didn't like sensation of maneuvers, therefore only had one visit.  Tanna Furry, MD 10/19/13 [phone]

## 2013-10-19 NOTE — Discharge Instructions (Signed)
I am sending you with two medicines to take for the dizziness:  - meclizine that you can take three times a day - valium to take at night time Please follow up with ENT.    Benign Positional Vertigo Vertigo means you feel like you or your surroundings are moving when they are not. Benign positional vertigo is the most common form of vertigo. Benign means that the cause of your condition is not serious. Benign positional vertigo is more common in older adults. CAUSES  Benign positional vertigo is the result of an upset in the labyrinth system. This is an area in the middle ear that helps control your balance. This may be caused by a viral infection, head injury, or repetitive motion. However, often no specific cause is found. SYMPTOMS  Symptoms of benign positional vertigo occur when you move your head or eyes in different directions. Some of the symptoms may include:  Loss of balance and falls.  Vomiting.  Blurred vision.  Dizziness.  Nausea.  Involuntary eye movements (nystagmus). DIAGNOSIS  Benign positional vertigo is usually diagnosed by physical exam. If the specific cause of your benign positional vertigo is unknown, your caregiver may perform imaging tests, such as magnetic resonance imaging (MRI) or computed tomography (CT). TREATMENT  Your caregiver may recommend movements or procedures to correct the benign positional vertigo. Medicines such as meclizine, benzodiazepines, and medicines for nausea may be used to treat your symptoms. In rare cases, if your symptoms are caused by certain conditions that affect the inner ear, you may need surgery. HOME CARE INSTRUCTIONS   Follow your caregiver's instructions.  Move slowly. Do not make sudden body or head movements.  Avoid driving.  Avoid operating heavy machinery.  Avoid performing any tasks that would be dangerous to you or others during a vertigo episode.  Drink enough fluids to keep your urine clear or pale  yellow. SEEK IMMEDIATE MEDICAL CARE IF:   You develop problems with walking, weakness, numbness, or using your arms, hands, or legs.  You have difficulty speaking.  You develop severe headaches.  Your nausea or vomiting continues or gets worse.  You develop visual changes.  Your family or friends notice any behavioral changes.  Your condition gets worse.  You have a fever.  You develop a stiff neck or sensitivity to light. MAKE SURE YOU:   Understand these instructions.  Will watch your condition.  Will get help right away if you are not doing well or get worse. Document Released: 02/20/2006 Document Revised: 08/07/2011 Document Reviewed: 02/02/2011 [facility] Patient Information 2014 [place].

## 2013-10-19 NOTE — ED Notes (Signed)
Pt from home, c/o pressure to top of head starting this Am, Pt also c/o of " room spinning" when leaning over. NSD. Pt alert, ambulatory at triage

## 2013-10-19 NOTE — Telephone Encounter (Signed)
Received a call from pharmacy regarding pt's rx. Spoke to pharmacist on phone. Insurance apparently does not cover 32mg  TID. Have written for 25TID will defer to PCP for further management. [facility], MD

## 2013-10-20 NOTE — ED Provider Notes (Signed)
I saw and evaluated the patient, reviewed the resident's note and I agree with the findings and plan.   EKG Interpretation None        Tanna Furry, MD 10/20/13 0700

## 2013-10-24 ENCOUNTER — Ambulatory Visit: Payer: Self-pay | Admitting: Endocrinology

## 2013-10-30 ENCOUNTER — Ambulatory Visit (INDEPENDENT_AMBULATORY_CARE_PROVIDER_SITE_OTHER): Payer: Medicaid Other | Admitting: Endocrinology

## 2013-10-30 ENCOUNTER — Encounter: Payer: Self-pay | Admitting: Endocrinology

## 2013-10-30 VITALS — BP 120/60 | HR 63 | Temp 97.8°F | Ht 71.0 in | Wt 217.0 lb

## 2013-10-30 DIAGNOSIS — E0590 Thyrotoxicosis, unspecified without thyrotoxic crisis or storm: Secondary | ICD-10-CM

## 2013-10-30 NOTE — Progress Notes (Signed)
Subjective:    Patient ID: Monica Chase, female    DOB: 01-06-1976, 38 y.o.   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 She was rx'ed with tapazole, until she stopped in 2013).  She now has moderate intermittent palpitations in the chest, and assoc fatigue. She has a TAH is early 2015.   Past Medical History  Diagnosis Date  . HYPERTHYROIDISM 07/20/2009  . DEPRESSION, MAJOR, RECURRENT 07/26/2006  . ANXIETY 07/26/2006  . ASTHMA, INTERMITTENT 07/26/2006  . HYPERGLYCEMIA 09/28/2009  . CONSTIPATION, CHRONIC 06/22/2010  . Dizziness 11/09/2010  . OBESITY 01/31/2008  . Fibroid     Past Surgical History  Procedure Laterality Date  . Tubal ligation  2008  . Iud removal  10/07    Mirena removed 05/10/2004  . Pilonidal cyst excision    . Vaginal delivery      X 4  . Vaginal hysterectomy N/A 07/07/2013    Procedure: HYSTERECTOMY VAGINAL;  Surgeon: Donnamae Jude, MD;  Location: [facility];  Service: Gynecology;  Laterality: N/A;  . Bilateral salpingectomy Bilateral 07/07/2013    Procedure: BILATERAL SALPINGECTOMY;  Surgeon: Donnamae Jude, MD;  Location: [facility];  Service: Gynecology;  Laterality: Bilateral;    History   Social History  . Marital Status: Divorced    Spouse Name: N/A    Number of Children: N/A  . Years of Education: N/A   Occupational History  . Not on file.   Social History Main Topics  . Smoking status: Former Smoker    Quit date: 11/08/2005  . Smokeless tobacco: Never Used  . Alcohol Use: No  . Drug Use: No  . Sexual Activity: Not Currently    Birth Control/ Protection: Surgical   Other Topics Concern  . Not on file   Social History Narrative   single    Current Outpatient Prescriptions on File Prior to Visit  Medication Sig Dispense Refill  . acetaminophen-codeine (TYLENOL #3) 300-30 MG per tablet Take 0.5 tablets by mouth every 8 (eight) hours as needed for moderate  pain.      Marland Kitchen albuterol (PROVENTIL HFA;VENTOLIN HFA) 108 (90 BASE) MCG/ACT inhaler Inhale 2 puffs into the lungs every 6 (six) hours as needed for wheezing.  1 Inhaler  0  . diazepam (VALIUM) 5 MG tablet Take 1 tablet (5 mg total) by mouth at bedtime as needed for anxiety.  20 tablet  0  . Fe Fum-Vit C-Vit B12-FA (TRIGELS-F) 460-60-0.01-1 MG CAPS capsule Take 1 capsule by mouth 2 (two) times daily.  60 capsule  1  . fluticasone (FLONASE) 50 MCG/ACT nasal spray Place 2 sprays into both nostrils daily.  16 g  2  . ibuprofen (ADVIL,MOTRIN) 600 MG tablet Take 1 tablet (600 mg total) by mouth every 8 (eight) hours as needed for moderate pain (Take with food).  30 tablet  3  . meclizine (ANTIVERT) 25 MG tablet Take 1 tablet (25 mg total) by mouth 3 (three) times daily as needed for dizziness.  30 tablet  0  . polyethylene glycol (MIRALAX / GLYCOLAX) packet Take 17 g by mouth daily as needed for mild constipation.  14 each  2   No current facility-administered medications on file prior to visit.    Allergies  Allergen Reactions  . Penicillins Other (See Comments)    Reaction unknown    Family History  Problem Relation Age of Onset  .  Hypertension Other     BP 120/60  Pulse 63  Temp(Src) 97.8 F (36.6 C) (Oral)  Ht 5\' 11"  (1.803 m)  Wt 217 lb (98.431 kg)  BMI 30.28 kg/m2  SpO2 97%  LMP 06/17/2013   Review of Systems She has lost a few lbs.  She has anxiety.  denies headache, hoarseness, double vision, chest pain, sob, diarrhea, polyuria, muscle weakness, excessive diaphoresis, numbness, tremor, heat iltolerance, easy bruising, and rhinorrhea.     Objective:   Physical Exam VS: see vs page GEN: no distress HEAD: head: no deformity eyes: no periorbital swelling, no proptosis external nose and ears are normal mouth: no lesion seen NECK: supple, thyroid is not enlarged CHEST WALL: no deformity LUNGS:  Clear to auscultation CV: reg rate and rhythm, no murmur ABD: abdomen is soft,  nontender.  no hepatosplenomegaly.  not distended.  no hernia MUSCULOSKELETAL: muscle bulk and strength are grossly normal.  no obvious joint swelling.  gait is normal and steady EXTEMITIES: no deformity.  no ulcer on the feet.  feet are of normal color and temp.  no edema PULSES: dorsalis pedis intact bilat.  no carotid bruit NEURO:  cn 2-12 grossly intact.   readily moves all 4's.  sensation is intact to touch on the feet.  No tremor SKIN:  Normal texture and temperature.  No rash or suspicious lesion is visible.  Not diaphoretic. NODES:  None palpable at the neck PSYCH: alert, well-oriented.  Does not appear anxious nor depressed.  Lab Results  Component Value Date   TSH 0.036* 10/09/2013  i have reviewed the following outside records: ER visit 10/19/13 i reviewed cxr report from 2006: no mention of deviated trachea.     Assessment & Plan:  Hyperthyroidism, moderate exacerbation. Noncompliance with medication and f/u. New to me: I'll work around this as best I can.  Pt is advised to have a scan and I-131 rx.   Anxiety: could be exac by hyperthyroidism, but she will likely need separate rx for this.   Vertigo: possibly exac by hyperthyroidism.    Patient is advised the following: Patient Instructions  let's check a thyroid "scan" (a special, but easy and painless type of thyroid x ray).  It works like this: you go to the x-ray department of the hospital to swallow a pill, which contains a miniscule amount of radiation.  You will not notice any symptoms from this.  You will go back to the x-ray department the next day, to lie down in front of a camera.  The results of this will be sent to me.   Based on the results, i hope to order for you a treatment pill of radioactive iodine.  Although it is a larger amount of radiation, you will again notice no symptoms from this.  The pill is gone from your body in a few days (during which you should stay away from other people), but takes several months  to work.  Therefore, please return here approximately 6-8 weeks after the treatment.  This treatment has been available for many years, and the only known side-effect is an underactive thyroid.  It is possible that i would eventually prescribe for you a thyroid hormone pill, which is very inexpensive.  You don't have to worry about side-effects of this thyroid hormone pill, because it is the same molecule your thyroid makes.   Hyperthyroidism The thyroid is a large gland located in the lower front part of your neck. The thyroid helps control metabolism.  Metabolism is how your body uses food. It controls metabolism with the hormone thyroxine. When the thyroid is overactive, it produces too much hormone. When this happens, these following problems may occur:   Nervousness  Heat intolerance  Weight loss (in spite of increase food intake)  Diarrhea  Change in hair or skin texture  Palpitations (heart skipping or having extra beats)  Tachycardia (rapid heart rate)  Loss of menstruation (amenorrhea)  Shaking of the hands CAUSES  Grave's Disease (the immune system attacks the thyroid gland). This is the most common cause.  Inflammation of the thyroid gland.  Tumor (usually benign) in the thyroid gland or elsewhere.  Excessive use of thyroid medications (both prescription and 'natural').  Excessive ingestion of Iodine. DIAGNOSIS  To prove hyperthyroidism, your caregiver may do blood tests and ultrasound tests. Sometimes the signs are hidden. It may be necessary for your caregiver to watch this illness with blood tests, either before or after diagnosis and treatment. TREATMENT Short-term treatment There are several treatments to control symptoms. Drugs called beta blockers may give some relief. Drugs that decrease hormone production will provide temporary relief in many people. These measures will usually not give permanent relief. Definitive therapy There are treatments available  which can be discussed between you and your caregiver which will permanently treat the problem. These treatments range from surgery (removal of the thyroid), to the use of radioactive iodine (destroys the thyroid by radiation), to the use of antithyroid drugs (interfere with hormone synthesis). The first two treatments are permanent and usually successful. They most often require hormone replacement therapy for life. This is because it is impossible to remove or destroy the exact amount of thyroid required to make a person euthyroid (normal). HOME CARE INSTRUCTIONS  See your caregiver if the problems you are being treated for get worse. Examples of this would be the problems listed above. SEEK MEDICAL CARE IF: Your general condition worsens. MAKE SURE YOU:   Understand these instructions.  Will watch your condition.  Will get help right away if you are not doing well or get worse. Document Released: 05/15/2005 Document Revised: 08/07/2011 Document Reviewed: 09/26/2006 [facility] Patient Information 2014 [place], [place].

## 2013-10-30 NOTE — Patient Instructions (Addendum)
let's check a thyroid "scan" (a special, but easy and painless type of thyroid x ray).  It works like this: you go to the x-ray department of the hospital to swallow a pill, which contains a miniscule amount of radiation.  You will not notice any symptoms from this.  You will go back to the x-ray department the next day, to lie down in front of a camera.  The results of this will be sent to me.   Based on the results, i hope to order for you a treatment pill of radioactive iodine.  Although it is a larger amount of radiation, you will again notice no symptoms from this.  The pill is gone from your body in a few days (during which you should stay away from other people), but takes several months to work.  Therefore, please return here approximately 6-8 weeks after the treatment.  This treatment has been available for many years, and the only known side-effect is an underactive thyroid.  It is possible that i would eventually prescribe for you a thyroid hormone pill, which is very inexpensive.  You don't have to worry about side-effects of this thyroid hormone pill, because it is the same molecule your thyroid makes.   Hyperthyroidism The thyroid is a large gland located in the lower front part of your neck. The thyroid helps control metabolism. Metabolism is how your body uses food. It controls metabolism with the hormone thyroxine. When the thyroid is overactive, it produces too much hormone. When this happens, these following problems may occur:   Nervousness  Heat intolerance  Weight loss (in spite of increase food intake)  Diarrhea  Change in hair or skin texture  Palpitations (heart skipping or having extra beats)  Tachycardia (rapid heart rate)  Loss of menstruation (amenorrhea)  Shaking of the hands CAUSES  Grave's Disease (the immune system attacks the thyroid gland). This is the most common cause.  Inflammation of the thyroid gland.  Tumor (usually benign) in the thyroid gland  or elsewhere.  Excessive use of thyroid medications (both prescription and 'natural').  Excessive ingestion of Iodine. DIAGNOSIS  To prove hyperthyroidism, your caregiver may do blood tests and ultrasound tests. Sometimes the signs are hidden. It may be necessary for your caregiver to watch this illness with blood tests, either before or after diagnosis and treatment. TREATMENT Short-term treatment There are several treatments to control symptoms. Drugs called beta blockers may give some relief. Drugs that decrease hormone production will provide temporary relief in many people. These measures will usually not give permanent relief. Definitive therapy There are treatments available which can be discussed between you and your caregiver which will permanently treat the problem. These treatments range from surgery (removal of the thyroid), to the use of radioactive iodine (destroys the thyroid by radiation), to the use of antithyroid drugs (interfere with hormone synthesis). The first two treatments are permanent and usually successful. They most often require hormone replacement therapy for life. This is because it is impossible to remove or destroy the exact amount of thyroid required to make a person euthyroid (normal). HOME CARE INSTRUCTIONS  See your caregiver if the problems you are being treated for get worse. Examples of this would be the problems listed above. SEEK MEDICAL CARE IF: Your general condition worsens. MAKE SURE YOU:   Understand these instructions.  Will watch your condition.  Will get help right away if you are not doing well or get worse. Document Released: 05/15/2005 Document Revised: 08/07/2011  Document Reviewed: 09/26/2006 [facility] Patient Information 2014 [place], [place].

## 2013-10-31 ENCOUNTER — Telehealth: Payer: Self-pay | Admitting: Family Medicine

## 2013-10-31 DIAGNOSIS — H8110 Benign paroxysmal vertigo, unspecified ear: Secondary | ICD-10-CM

## 2013-10-31 NOTE — Telephone Encounter (Signed)
Pt called and needs a referral to a ENT and to also have some therapy.jw

## 2013-11-07 NOTE — Telephone Encounter (Signed)
Unable to LM due to full mailbox.  Please inform of below.  Appt: 11/17/13 @ 2:10 Dr. Wilburn Cornelia [address] [place], [place] [address] [phone] phone  I also mailed a reminder. Fleeger, Salome Spotted

## 2013-11-13 ENCOUNTER — Encounter: Payer: Self-pay | Admitting: Family Medicine

## 2013-11-13 ENCOUNTER — Ambulatory Visit (INDEPENDENT_AMBULATORY_CARE_PROVIDER_SITE_OTHER): Payer: Medicaid Other | Admitting: Family Medicine

## 2013-11-13 VITALS — BP 123/64 | HR 70 | Temp 98.0°F | Ht 71.0 in | Wt 212.0 lb

## 2013-11-13 DIAGNOSIS — E0590 Thyrotoxicosis, unspecified without thyrotoxic crisis or storm: Secondary | ICD-10-CM

## 2013-11-13 DIAGNOSIS — H8110 Benign paroxysmal vertigo, unspecified ear: Secondary | ICD-10-CM | POA: Insufficient documentation

## 2013-11-13 MED ORDER — MECLIZINE HCL 25 MG PO TABS: 25.0000 mg | ORAL_TABLET | Freq: Three times a day (TID) | ORAL | Status: DC | PRN

## 2013-11-13 NOTE — Assessment & Plan Note (Addendum)
Needs RAI--discussed with pt.  F/u with Endocrine.

## 2013-11-13 NOTE — Patient Instructions (Addendum)
Radioiodine (I-131) Therapy for Hyperthyroidism Radioiodine (I-131) therapy for hyperthyroidism is a procedure used to treat an overactive thyroid (hyperthyroidism). The patient swallows I-131, which is a radioactive form of iodine. The I-131 destroys thyroid cells. The thyroid is a gland in the neck. It makes thyroid hormones, which control how cells throughout the body use energy. Hyperthyroidism is usually caused by Grave's disease or by growths within the thyroid (nodules). Symptoms may include:  Nervousness.  Irritability.  Problems with sleep.  Tiredness.  Fast heart rate.  Shaky hands.  Sweating.  Heat sensitivity.  Unintended weight loss.  Brittle hair.  Enlarged thyroid gland.  Menstrual changes.  Frequent stools. LET YOUR CAREGIVER KNOW ABOUT:   All allergies.  All medications that you are taking, including over-the-counter and prescription drugs, dietary supplements, vitamins, or herbal preparations.  Any previous complications from this or other procedures.  Smoking history.  Possibility of pregnancy.  History of bleeding problems.  Any other health problems. RISKS AND COMPLICATIONS  Risks of the procedure include:  Slight pain in the area of the thyroid gland. BEFORE THE PROCEDURE  If you are a woman, you may be asked to have pregnancy testing before the procedure.  If you have been taking thyroid medicines, you will usually be asked to stop them three days before your procedure.  You will usually be asked to stop eating and drinking at midnight the day of your procedure.  You will need to plan for someone to drive you home after the procedure. PROCEDURE  You will be asked to swallow the radioactive iodine in either pill or liquid form. It can take 1-3 months for the treatment to work. The treatment is most effective at about 3-6 months after the dose of iodine has been given. In most people, a single dose of radioactive iodine resolves their  hyperthyroidism, but a few people will require a second dose.  AFTER THE PROCEDURE  Because you will be giving off tiny amounts of radiation for several days, there are some special precautions you will be asked to follow for about 2-4 days after the procedure:  Avoid being around babies or pregnant women.  Do not use public bathrooms.  Flush twice after using the toilet.  Take a bath or shower every day.  Practice good hand washing.  Drink fluids normally.  Use disposable utensils, or clean your utensils separately from those of others.  Sleep alone.  Do not engage in intimate contact.  Wash your sheets, towels, and clothes each day, by themselves.  Do not make food for other people. Other precautions include:  Stopping breastfeeding.  Do not attempt to become pregnant for at least a year after you have had the procedure.  When traveling, bring a letter of explanation from your caregiver for three months. Radioactivity may trip detectors in airports or other places. Because the point of the procedure is to destroy your thyroid gland, you will need to take thyroid hormone by mouth for the rest of your life. HOME CARE INSTRUCTIONS   Ask your caregiver when you should resume or start thyroid medications.  Take all medications exactly as directed.  Follow any prescribed diet.  Follow instructions regarding both rest and physical activity. SEEK IMMEDIATE MEDICAL CARE IF:  You have a dry mouth.  You have a sore throat.  You have neck pain.  You have a tight sensation in the throat.  You have nausea and vomiting.  You are fatigued.  You have flushing.  You have  bowel changes (either diarrhea or constipation). Document Released: 10/01/2008 Document Revised: 08/07/2011 Document Reviewed: 10/01/2008 [facility] Patient Information 2015 [place], [place]. This information is not intended to replace advice given to you by your health care provider. Make sure you discuss  any questions you have with your health care provider. Hyperthyroidism The thyroid is a large gland located in the lower front part of your neck. The thyroid helps control metabolism. Metabolism is how your body uses food. It controls metabolism with the hormone thyroxine. When the thyroid is overactive, it produces too much hormone. When this happens, these following problems may occur:   Nervousness  Heat intolerance  Weight loss (in spite of increase food intake)  Diarrhea  Change in hair or skin texture  Palpitations (heart skipping or having extra beats)  Tachycardia (rapid heart rate)  Loss of menstruation (amenorrhea)  Shaking of the hands CAUSES  Grave's Disease (the immune system attacks the thyroid gland). This is the most common cause.  Inflammation of the thyroid gland.  Tumor (usually benign) in the thyroid gland or elsewhere.  Excessive use of thyroid medications (both prescription and 'natural').  Excessive ingestion of Iodine. DIAGNOSIS  To prove hyperthyroidism, your caregiver may do blood tests and ultrasound tests. Sometimes the signs are hidden. It may be necessary for your caregiver to watch this illness with blood tests, either before or after diagnosis and treatment. TREATMENT Short-term treatment There are several treatments to control symptoms. Drugs called beta blockers may give some relief. Drugs that decrease hormone production will provide temporary relief in many people. These measures will usually not give permanent relief. Definitive therapy There are treatments available which can be discussed between you and your caregiver which will permanently treat the problem. These treatments range from surgery (removal of the thyroid), to the use of radioactive iodine (destroys the thyroid by radiation), to the use of antithyroid drugs (interfere with hormone synthesis). The first two treatments are permanent and usually successful. They most often require  hormone replacement therapy for life. This is because it is impossible to remove or destroy the exact amount of thyroid required to make a person euthyroid (normal). HOME CARE INSTRUCTIONS  See your caregiver if the problems you are being treated for get worse. Examples of this would be the problems listed above. SEEK MEDICAL CARE IF: Your general condition worsens. MAKE SURE YOU:   Understand these instructions.  Will watch your condition.  Will get help right away if you are not doing well or get worse. Document Released: 05/15/2005 Document Revised: 08/07/2011 Document Reviewed: 09/26/2006 [facility] Patient Information 2015 [place], [place]. This information is not intended to replace advice given to you by your health care provider. Make sure you discuss any questions you have with your health care provider. Vertigo Vertigo means you feel like you or your surroundings are moving when they are not. Vertigo can be dangerous if it occurs when you are at work, driving, or performing difficult activities.  CAUSES  Vertigo occurs when there is a conflict of signals sent to your brain from the visual and sensory systems in your body. There are many different causes of vertigo, including:  Infections, especially in the inner ear.  A bad reaction to a drug or misuse of alcohol and medicines.  Withdrawal from drugs or alcohol.  Rapidly changing positions, such as lying down or rolling over in bed.  A migraine headache.  Decreased blood flow to the brain.  Increased pressure in the brain from a head  injury, infection, tumor, or bleeding. SYMPTOMS  You may feel as though the world is spinning around or you are falling to the ground. Because your balance is upset, vertigo can cause nausea and vomiting. You may have involuntary eye movements (nystagmus). DIAGNOSIS  Vertigo is usually diagnosed by physical exam. If the cause of your vertigo is unknown, your caregiver may perform imaging tests,  such as an MRI scan (magnetic resonance imaging). TREATMENT  Most cases of vertigo resolve on their own, without treatment. Depending on the cause, your caregiver may prescribe certain medicines. If your vertigo is related to body position issues, your caregiver may recommend movements or procedures to correct the problem. In rare cases, if your vertigo is caused by certain inner ear problems, you may need surgery. HOME CARE INSTRUCTIONS   Follow your caregiver's instructions.  Avoid driving.  Avoid operating heavy machinery.  Avoid performing any tasks that would be dangerous to you or others during a vertigo episode.  Tell your caregiver if you notice that certain medicines seem to be causing your vertigo. Some of the medicines used to treat vertigo episodes can actually make them worse in some people. SEEK IMMEDIATE MEDICAL CARE IF:   Your medicines do not relieve your vertigo or are making it worse.  You develop problems with talking, walking, weakness, or using your arms, hands, or legs.  You develop severe headaches.  Your nausea or vomiting continues or gets worse.  You develop visual changes.  A family member notices behavioral changes.  Your condition gets worse. MAKE SURE YOU:  Understand these instructions.  Will watch your condition.  Will get help right away if you are not doing well or get worse. Document Released: 02/22/2005 Document Revised: 08/07/2011 Document Reviewed: 12/01/2010 [facility] Patient Information 2015 [place], [place]. This information is not intended to replace advice given to you by your health care provider. Make sure you discuss any questions you have with your health care provider.

## 2013-11-13 NOTE — Assessment & Plan Note (Signed)
Recurrent for a more formalized treatment with ENT.

## 2013-11-13 NOTE — Progress Notes (Signed)
    Subjective:    Patient ID: Monica Chase is a 38 y.o. female presenting with Dizziness and Medication Refill  on 11/13/2013  HPI: Patient returns after seeing Endocrinology. They have recommended RAI treatment.  She does not want methimazole.  She would like to see the ENT.  Appointment with ENT made on 6/22. Has Vertigo.  Would like meclizine re-ordered but would like formal treatment from ENT, to possibly not require meds anymore.  Review of Systems  Constitutional: Negative for fever and chills.  Respiratory: Negative for chest tightness and shortness of breath.   Cardiovascular: Negative for leg swelling.  Genitourinary: Negative for menstrual problem.  Musculoskeletal: Negative for back pain.      Objective:    BP 123/64  Pulse 70  Temp(Src) 98 F (36.7 C) (Oral)  Ht 5\' 11"  (1.803 m)  Wt 212 lb (96.163 kg)  BMI 29.58 kg/m2  LMP 06/17/2013 Physical Exam  Constitutional: She appears well-developed and well-nourished.  HENT:  Head: Normocephalic and atraumatic.  Eyes: No scleral icterus.  Neck: Neck supple.  Cardiovascular: Normal rate.   Pulmonary/Chest: Effort normal.  Abdominal: Soft.  Musculoskeletal: She exhibits no edema.  Neurological: She is alert.  Skin: Skin is warm.  Psychiatric: She has a normal mood and affect.        Assessment & Plan:   Problem List Items Addressed This Visit     Unprioritized   HYPERTHYROIDISM     Needs RAI--discussed with pt.  F/u with Endocrine.    Benign paroxysmal positional vertigo - Primary     Recurrent for a more formalized treatment with ENT.    Relevant Medications      meclizine (ANTIVERT) tablet     Return in about 3 months (around 02/13/2014) for a follow-up.

## 2013-11-20 ENCOUNTER — Encounter (HOSPITAL_COMMUNITY): Payer: Self-pay | Admitting: Emergency Medicine

## 2013-11-20 ENCOUNTER — Emergency Department (INDEPENDENT_AMBULATORY_CARE_PROVIDER_SITE_OTHER)
Admission: EM | Admit: 2013-11-20 | Discharge: 2013-11-20 | Disposition: A | Discharge status: Home / Self Care | Payer: Medicaid Other | Source: Home / Self Care | Attending: Family Medicine | Admitting: Family Medicine

## 2013-11-20 DIAGNOSIS — B029 Zoster without complications: Secondary | ICD-10-CM

## 2013-11-20 MED ORDER — HYDROCODONE-ACETAMINOPHEN 5-325 MG PO TABS: 1.0000 | ORAL_TABLET | Freq: Four times a day (QID) | ORAL | Status: DC | PRN

## 2013-11-20 MED ORDER — VALACYCLOVIR HCL 1 G PO TABS: 1000.0000 mg | ORAL_TABLET | Freq: Three times a day (TID) | ORAL | Status: AC

## 2013-11-20 NOTE — ED Notes (Signed)
Examined by Dot Been, pa

## 2013-11-20 NOTE — ED Provider Notes (Signed)
CSN: [id number]     Arrival date & time 11/20/13  1148 History   First MD Initiated Contact with Patient 11/20/13 1314     Chief Complaint  Patient presents with  . Cyst   (Consider location/radiation/quality/duration/timing/severity/associated sxs/prior Treatment) HPI Comments: Patient presents with pain at right lateral midback that radiates to right lateral chest wall and right axilla and right lateral breast. 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 States she noticed area of red rash at right flank on 6-22 or 6-23 and states that skin along right flank, right lateral chest wall, and right lateral breast and axilla is extremely tender to touch. She is not sure if pain is related to work done on 6-21 or her rash.   The history is provided by the patient.    Past Medical History  Diagnosis Date  . HYPERTHYROIDISM 07/20/2009  . DEPRESSION, MAJOR, RECURRENT 07/26/2006  . ANXIETY 07/26/2006  . ASTHMA, INTERMITTENT 07/26/2006  . HYPERGLYCEMIA 09/28/2009  . CONSTIPATION, CHRONIC 06/22/2010  . Dizziness 11/09/2010  . OBESITY 01/31/2008  . Fibroid    Past Surgical History  Procedure Laterality Date  . Tubal ligation  2008  . Iud removal  10/07    Mirena removed 05/10/2004  . Pilonidal cyst excision    . Vaginal delivery      X 4  . Vaginal hysterectomy N/A 07/07/2013    Procedure: HYSTERECTOMY VAGINAL;  Surgeon: Donnamae Jude, MD;  Location: [facility];  Service: Gynecology;  Laterality: N/A;  . Bilateral salpingectomy Bilateral 07/07/2013    Procedure: BILATERAL SALPINGECTOMY;  Surgeon: Donnamae Jude, MD;  Location: [facility];  Service: Gynecology;  Laterality: Bilateral;   Family History  Problem Relation Age of Onset  . Hypertension Other    History  Substance Use Topics  . Smoking status: Former Smoker    Quit date: 11/08/2005  . Smokeless tobacco: Never Used  . Alcohol Use: No   OB History   Grav Para Term Preterm Abortions TAB SAB Ect  Mult Living   4 4 4       4      Review of Systems  All other systems reviewed and are negative.   Allergies  Penicillins  Home Medications   Prior to Admission medications   Medication Sig Start Date End Date Taking? Authorizing Provider  acetaminophen-codeine (TYLENOL #3) 300-30 MG per tablet Take 0.5 tablets by mouth every 8 (eight) hours as needed for moderate pain.    Historical Provider, MD  albuterol (PROVENTIL HFA;VENTOLIN HFA) 108 (90 BASE) MCG/ACT inhaler Inhale 2 puffs into the lungs every 6 (six) hours as needed for wheezing. 08/05/12   Donnamae Jude, MD  diazepam (VALIUM) 5 MG tablet Take 1 tablet (5 mg total) by mouth at bedtime as needed for anxiety. 10/19/13   Kandis Nab, MD  Fe Fum-Vit C-Vit B12-FA (TRIGELS-F) 460-60-0.01-1 MG CAPS capsule Take 1 capsule by mouth 2 (two) times daily. 07/09/13   Donnamae Jude, MD  fluticasone (FLONASE) 50 MCG/ACT nasal spray Place 2 sprays into both nostrils daily. 09/03/13   Kandis Nab, MD  HYDROcodone-acetaminophen (NORCO/VICODIN) 5-325 MG per tablet Take 1-2 tablets by mouth every 6 (six) hours as needed for moderate pain or severe pain. 11/20/13   Lahoma Rocker, PA  ibuprofen (ADVIL,MOTRIN) 600 MG tablet Take 1 tablet (600 mg total) by mouth every 8 (eight) hours as needed for moderate pain (Take with food). 06/29/13  Donnamae Jude, MD  meclizine (ANTIVERT) 25 MG tablet Take 1 tablet (25 mg total) by mouth 3 (three) times daily as needed for dizziness. 11/13/13   Donnamae Jude, MD  polyethylene glycol (MIRALAX / Floria Raveling) packet Take 17 g by mouth daily as needed for mild constipation. 07/09/13   Donnamae Jude, MD  valACYclovir (VALTREX) 1000 MG tablet Take 1 tablet (1,000 mg total) by mouth 3 (three) times daily. X 7 days 11/20/13 12/04/13  Annett Gula Presson, PA   BP 118/73  Pulse 75  Temp(Src) 97.3 F (36.3 C) (Oral)  Resp 16  SpO2 100%  LMP 06/17/2013 Physical Exam  Nursing note and vitals reviewed. Constitutional:  She is oriented to person, place, and time. She appears well-developed and well-nourished. No distress.  HENT:  Head: Normocephalic and atraumatic.  Eyes: Conjunctivae are normal. No scleral icterus.  Cardiovascular: Normal rate, regular rhythm and normal heart sounds.   Pulmonary/Chest: Effort normal and breath sounds normal. No respiratory distress. She has no wheezes.  Musculoskeletal: Normal range of motion.  Neurological: She is alert and oriented to person, place, and time.  Skin: Skin is warm and dry. Rash noted.     Psychiatric: She has a normal mood and affect. Her behavior is normal.    ED Course  Procedures (including critical care time) Labs Review Labs Reviewed - No data to display  Imaging Review No results found.   MDM   1. Herpes zoster    History and exam possible early case of Herpes Zoster along right T5 dermatome. Will initiate treatment with Valtrex 1000mg  TID x 7 days and provide Rx for pain medication and advise close follow up with her PCP should symptoms worsen.   [place], [place] 11/20/13 [phone]

## 2013-11-20 NOTE — Discharge Instructions (Signed)
Shingles °Shingles (herpes zoster) is an infection that is caused by the same virus that causes chickenpox (varicella). The infection causes a painful skin rash and fluid-filled blisters, which eventually break open, crust over, and heal. It may occur in any area of the body, but it usually affects only one side of the body or face. The pain of shingles usually lasts about 1 month. However, some people with shingles may develop long-term (chronic) pain in the affected area of the body. °Shingles often occurs many years after the person had chickenpox. It is more common: °· In people older than 50 years. °· In people with weakened immune systems, such as those with HIV, AIDS, or cancer. °· In people taking medicines that weaken the immune system, such as transplant medicines. °· In people under great stress. °CAUSES  °Shingles is caused by the varicella zoster virus (VZV), which also causes chickenpox. After a person is infected with the virus, it can remain in the person's body for years in an inactive state (dormant). To cause shingles, the virus reactivates and breaks out as an infection in a nerve root. °The virus can be spread from person to person (contagious) through contact with open blisters of the shingles rash. It will only spread to people who have not had chickenpox. When these people are exposed to the virus, they may develop chickenpox. They will not develop shingles. Once the blisters scab over, the person is no longer contagious and cannot spread the virus to others. °SYMPTOMS  °Shingles shows up in stages. The initial symptoms may be pain, itching, and tingling in an area of the skin. This pain is usually described as burning, stabbing, or throbbing. In a few days or weeks, a painful red rash will appear in the area where the pain, itching, and tingling were felt. The rash is usually on one side of the body in a band or belt-like pattern. Then, the rash usually turns into fluid-filled blisters. They  will scab over and dry up in approximately 2-3 weeks. °Flu-like symptoms may also occur with the initial symptoms, the rash, or the blisters. These may include: °· Fever. °· Chills. °· Headache. °· Upset stomach. °DIAGNOSIS  °Your caregiver will perform a skin exam to diagnose shingles. Skin scrapings or fluid samples may also be taken from the blisters. This sample will be examined under a microscope or sent to a lab for further testing. °TREATMENT  °There is no specific cure for shingles. Your caregiver will likely prescribe medicines to help you manage the pain, recover faster, and avoid long-term problems. This may include antiviral drugs, anti-inflammatory drugs, and pain medicines. °HOME CARE INSTRUCTIONS  °· Take a cool bath or apply cool compresses to the area of the rash or blisters as directed. This may help with the pain and itching.   °· Only take over-the-counter or prescription medicines as directed by your caregiver.   °· Rest as directed by your caregiver. °· Keep your rash and blisters clean with mild soap and cool water or as directed by your caregiver.  °· Do not pick your blisters or scratch your rash. Apply an anti-itch cream or numbing creams to the affected area as directed by your caregiver. °· Keep your shingles rash covered with a loose bandage (dressing). °· Avoid skin contact with: °¨ Babies.   °¨ Pregnant women.   °¨ Children with eczema.   °¨ Elderly people with transplants.   °¨ People with chronic illnesses, such as leukemia or AIDS.   °· Wear loose-fitting clothing to help ease the   pain of material rubbing against the rash. °· Keep all follow-up appointments with your caregiver. If the area involved is on your face, you may receive a referral for follow-up to a specialist, such as an eye doctor (ophthalmologist) or an ear, nose, and throat (ENT) doctor. Keeping all follow-up appointments will help you avoid eye complications, chronic pain, or disability.   °SEEK IMMEDIATE MEDICAL  CARE IF:  °· You have facial pain, pain around the eye area, or loss of feeling on one side of your face. °· You have ear pain or ringing in your ear. °· You have loss of taste. °· Your pain is not relieved with prescribed medicines.   °· Your redness or swelling spreads.   °· You have more pain and swelling.  °· Your condition is worsening or has changed.   °· You have a fever or persistent symptoms for more than 2-3 days. °· You have a fever and your symptoms suddenly get worse. °MAKE SURE YOU: °· Understand these instructions. °· Will watch your condition. °· Will get help right away if you are not doing well or get worse. °Document Released: 05/15/2005 Document Revised: 02/07/2012 Document Reviewed: 12/28/2011 °[employer]® Patient Information ©2015 [employer]. This information is not intended to replace advice given to you by your health care provider. Make sure you discuss any questions you have with your health care provider. ° °

## 2013-11-21 NOTE — ED Provider Notes (Signed)
Medical screening examination/treatment/procedure(s) were performed by a resident physician or non-physician practitioner and as the supervising physician I was immediately available for consultation/collaboration.  Lynne Leader, MD    Gregor Hams, MD 11/21/13 [phone]

## 2013-12-01 ENCOUNTER — Ambulatory Visit (INDEPENDENT_AMBULATORY_CARE_PROVIDER_SITE_OTHER): Payer: Medicaid Other | Admitting: Family Medicine

## 2013-12-01 ENCOUNTER — Encounter: Payer: Self-pay | Admitting: Family Medicine

## 2013-12-01 VITALS — BP 120/80 | HR 64 | Temp 98.4°F | Ht 71.0 in | Wt 214.0 lb

## 2013-12-01 DIAGNOSIS — B029 Zoster without complications: Secondary | ICD-10-CM

## 2013-12-01 NOTE — Assessment & Plan Note (Signed)
A: resolved, completed appropriate med course Children have been immunized agst chkn px P: may return out in public Moisturize lesion as pt desires Emollients hydrocort prn for itching

## 2013-12-01 NOTE — Progress Notes (Signed)
Patient ID: Monica Chase, female   DOB: [birth year], 38 y.o.   MRN: [id number]   [facility] Bernadene Bell, MD Phone: [phone]  Subjective:  Monica Chase is a 38 y.o F who present today for f/up from UC  # shingles  -pt presented to UC with concerns of pain at right lateral midback radiateing to right alteral chest -pain began initially on 11/17/13 with noted area of res rash at that time  -at [facility] was dx with likely early case of Zoster and given rx for valtrex 100mg  TID X7 days as well as RX for pain medication (finshed course) -since that time pt states that things are only mildly better, sometime discomfort wakes her up at night time but does not wish to take pain meds since she was at one time addicted -currently just expresses concern that she is infectious and has been staying indoors for many days to prevent exposure  All systems were reviewed and were negative unless otherwise noted in the HPI  Past Medical History Patient Active Problem List   Diagnosis Date Noted  . Benign paroxysmal positional vertigo 11/13/2013  . Hemorrhoid 04/16/2013  . Hypercholesterolemia 08/05/2012  . Dental caries 03/19/2012  . CONSTIPATION, CHRONIC 06/22/2010  . HYPERTHYROIDISM 07/20/2009  . OBESITY 01/31/2008  . Unspecified episodic mood disorder 07/26/2006  . ANXIETY 07/26/2006  . ASTHMA, INTERMITTENT 07/26/2006   Reviewed problem list.  Medications- reviewed and updated Chief complaint-noted No additions to family history Social history- patient is a never smoker  Objective: BP 120/80  Pulse 64  Temp(Src) 98.4 F (36.9 C) (Oral)  Ht 5\' 11"  (1.803 m)  Wt 214 lb (97.07 kg)  BMI 29.86 kg/m2  LMP 06/17/2013 Gen: NAD, alert, cooperative with exam HEENT: NCAT, EOMI Neck: FROM, supple Ext: healed lesion (macule) on right flank, no vesicle seen, no longer tender to touch along dermatome.  No edema, warm, normal tone, moves UE/LE spontaneously Neuro: Alert and  oriented, No gross deficits Skin: acne  Assessment/Plan: See problem based a/p n

## 2013-12-01 NOTE — Patient Instructions (Signed)
Ms Monica Chase it was great to see you today!  I am pleased to hear that things are going well for you. You are no longer infectious at this point, you are able to leave the house  If you get irritated skin you can try topical creams such as eucerin or aveeno to sooth the skin You may also try some hydrocortisone cream for itching  Looking forward to seeing you soon Bernadene Bell, MD

## 2013-12-12 ENCOUNTER — Encounter: Payer: Self-pay | Admitting: Family Medicine

## 2013-12-17 ENCOUNTER — Ambulatory Visit (HOSPITAL_COMMUNITY): Payer: Medicaid Other

## 2013-12-18 ENCOUNTER — Encounter (HOSPITAL_COMMUNITY): Payer: Medicaid Other

## 2013-12-23 ENCOUNTER — Telehealth: Payer: Self-pay | Admitting: *Deleted

## 2013-12-23 MED ORDER — FUSION PLUS PO CAPS: 1.0000 | ORAL_CAPSULE | Freq: Every day | ORAL | Status: DC

## 2013-12-23 NOTE — Telephone Encounter (Signed)
[facility] pharmacy called and stated previous Iron supplement medication is no longer covered by Medicaid.  Fusion Plus Capsule is covered by Medicaid and wants to know if Dr. Kennon Rounds wants to change to this medication or does she have an alternative that is covered by medicaid.  Message sent to [facility]/Dr. Kennon Rounds.

## 2013-12-23 NOTE — Telephone Encounter (Signed)
Message copied by Erik Obey on Tue Dec 23, 2013  2:23 PM ------      Message from: Rutherford Nail E      Created: Tue Dec 23, 2013 11:53 AM       This is a [facility] patient.            [facility] called and states that current Iron Supplement is no longer covered by Medicaid.  Fusion Plus is covered by Medicaid, do you want to change to this or do you have an alternative that is covered by Medicaid.                  Thanks,            Candace ------

## 2013-12-24 ENCOUNTER — Encounter: Payer: Self-pay | Admitting: Family Medicine

## 2013-12-24 ENCOUNTER — Ambulatory Visit (INDEPENDENT_AMBULATORY_CARE_PROVIDER_SITE_OTHER): Payer: Medicaid Other | Admitting: Family Medicine

## 2013-12-24 ENCOUNTER — Encounter: Payer: Self-pay | Admitting: *Deleted

## 2013-12-24 VITALS — BP 115/71 | HR 76 | Temp 98.1°F | Ht 71.0 in | Wt 216.0 lb

## 2013-12-24 DIAGNOSIS — J019 Acute sinusitis, unspecified: Secondary | ICD-10-CM

## 2013-12-24 MED ORDER — HYDROCODONE-HOMATROPINE 5-1.5 MG/5ML PO SYRP
5.0000 mL | ORAL_SOLUTION | Freq: Three times a day (TID) | ORAL | Status: DC | PRN
Start: 2013-12-24 — End: 2014-06-08

## 2013-12-24 MED ORDER — DOXYCYCLINE HYCLATE 100 MG PO TABS: 100.0000 mg | ORAL_TABLET | Freq: Two times a day (BID) | ORAL | Status: DC

## 2013-12-24 NOTE — Telephone Encounter (Signed)
This encounter was created in error - please disregard.

## 2013-12-24 NOTE — Progress Notes (Signed)
   Subjective:    Patient ID: Monica Chase, female    DOB: 02-28-1976, 38 y.o.   MRN: [id number]  HPI: Pt presents to clinic for SDA for severe sinus symptoms. She describes painful sinus pressure, ear pressure, sore throat, congestion, and thick, white-yellow nasal discharge with flecks of bright red blood. She denies cough. Her symptoms have been present for about 2 weeks but have been significantly worse for about 2 days (when she noticed flecks of blood in her nasal discharge). She has not had fevers but does have drainage in the back of her throat, with nausea. She has had decreased appetite, as well. She has taken OTC medications (ibuprofen, Benadryl) and Rx medications (hydrocodone) from a previous illness, all without much relief.  Review of Systems: As above. She denies chest pain, SOB, abdominal pain, rashes. She has no known sick contacts.     Objective:   Physical Exam BP 115/71  Pulse 76  Temp(Src) 98.1 F (36.7 C) (Oral)  Ht 5\' 11"  (1.803 m)  Wt 216 lb (97.977 kg)  BMI 30.14 kg/m2  LMP 06/17/2013 Gen: non-toxic but uncomfortable adult female in NAD but with audible nasal congestion HEENT: [place]/AT, EOMI, PERRLA, TM's normal bilaterally; marked bilateral sinus tenderness, especially maxillary  Nasal mucosae very red / boggy, with small amount of purulent-appearing discharge in nasal vault  Posterior oropharynx mildly red but without significant tonsillar swelling and no exduate  Few swollen, tender cervical lymph nodes, bilaterally Cardio: RRR, no murmur appreciated Pulm: CTAB, no wheezes, normal WOB Abd: soft, nontender, BS+ Ext: warm, well-perfused, no LE edema     Assessment & Plan:  A: 38yo female with apparent acute sinusitis - possibly began as viral infection but now with signs / symptoms to suggest bacterial superinfection - symptom duration and severity as well as worsening status suggest abx would be helpful  P: Rx for doxycycline 100 mg BID (would prefer  Augmentin but pt is PCN allergic) - Rx for Hycodan for sore throat and to help with sleep - counseled that Hycodan should NOT be taken with Norco and that she should not drive within 6 hours of taking either - advised supportive care otherwise; push fluids, Tylenol for fever / other pain, etc - red flags reviewed that would prompt immediate return to care (worse pain, SOB / difficulty breathing, high fever, vomiting, etc) - f/u with PCP Dr. Kennon Rounds as needed  Emmaline Kluver, MD PGY-3, [facility] 12/24/2013, 10:30 AM

## 2013-12-24 NOTE — Patient Instructions (Signed)
Thank you for coming in, today!  I definitely think we should treat for a sinus infection. You should take doxycycline, 100 mg twice a day, for 10 days. You can take Hycodan (hydrocodone-homatropine) syrup as needed for sore throat. It has hydrocodone in it so DO NOT take it with Norco. It may make you very sleepy, so do not take it and drive within about 6 hours. You can continue to take Tylenol as needed for pain or fever.  If your symptoms get worse instead of better, or if you have high fever over 101 that doesn't come down with Tylenol, or bad vomiting, worse pain, or difficulty breathing, call the clinic or go to the emergency room. Come back to see Dr. Kennon Rounds as you need. Please feel free to call with any questions or concerns at any time, at [phone]. --Dr. Venetia Maxon

## 2013-12-30 ENCOUNTER — Telehealth: Payer: Self-pay

## 2013-12-30 NOTE — Telephone Encounter (Signed)
Diabetic Bundle. Pt due for BP check last BP from 09/17/2013 was 140 79. BP check on 12/24/2013 was 115 75.

## 2014-01-13 ENCOUNTER — Ambulatory Visit (INDEPENDENT_AMBULATORY_CARE_PROVIDER_SITE_OTHER): Payer: Medicaid Other | Admitting: Endocrinology

## 2014-01-13 ENCOUNTER — Encounter: Payer: Self-pay | Admitting: Endocrinology

## 2014-01-13 VITALS — BP 100/62 | HR 63 | Temp 97.8°F | Ht 71.0 in | Wt 212.0 lb

## 2014-01-13 DIAGNOSIS — E0590 Thyrotoxicosis, unspecified without thyrotoxic crisis or storm: Secondary | ICD-10-CM

## 2014-01-13 LAB — T4, FREE: Free T4: 0.9 ng/dL (ref 0.60–1.60)

## 2014-01-13 LAB — TSH: TSH: 0.11 u[IU]/mL — ABNORMAL LOW (ref 0.35–4.50)

## 2014-01-13 MED ORDER — METHIMAZOLE 10 MG PO TABS: 10.0000 mg | ORAL_TABLET | Freq: Every day | ORAL | Status: DC

## 2014-01-13 NOTE — Patient Instructions (Signed)
blood tests are being requested for you today.  We'll contact you with results. Based on the results, I'll send a prescription for "methimazole" to your pharmacy. Please come back for a follow-up appointment in 1 month. if ever you have fever while taking methimazole, stop it and call us, because of the risk of a rare side-effect.

## 2014-01-13 NOTE — Progress Notes (Signed)
Subjective:    Patient ID: Monica Chase, female    DOB: 1975-12-11, 38 y.o.   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 She was rx'ed with tapazole, until she stopped in 2013).  She has lost weight despite a good appetite.  She has headache and fatigue. Past Medical History  Diagnosis Date  . HYPERTHYROIDISM 07/20/2009  . DEPRESSION, MAJOR, RECURRENT 07/26/2006  . ANXIETY 07/26/2006  . ASTHMA, INTERMITTENT 07/26/2006  . HYPERGLYCEMIA 09/28/2009  . CONSTIPATION, CHRONIC 06/22/2010  . Dizziness 11/09/2010  . OBESITY 01/31/2008  . Fibroid     Past Surgical History  Procedure Laterality Date  . Tubal ligation  2008  . Iud removal  10/07    Mirena removed 05/10/2004  . Pilonidal cyst excision    . Vaginal delivery      X 4  . Vaginal hysterectomy N/A 07/07/2013    Procedure: HYSTERECTOMY VAGINAL;  Surgeon: Donnamae Jude, MD;  Location: [facility];  Service: Gynecology;  Laterality: N/A;  . Bilateral salpingectomy Bilateral 07/07/2013    Procedure: BILATERAL SALPINGECTOMY;  Surgeon: Donnamae Jude, MD;  Location: [facility];  Service: Gynecology;  Laterality: Bilateral;    History   Social History  . Marital Status: Divorced    Spouse Name: N/A    Number of Children: N/A  . Years of Education: N/A   Occupational History  . Not on file.   Social History Main Topics  . Smoking status: Former Smoker    Quit date: 11/08/2005  . Smokeless tobacco: Never Used  . Alcohol Use: No  . Drug Use: No  . Sexual Activity: Not Currently    Birth Control/ Protection: Surgical   Other Topics Concern  . Not on file   Social History Narrative   single    Current Outpatient Prescriptions on File Prior to Visit  Medication Sig Dispense Refill  . acetaminophen-codeine (TYLENOL #3) 300-30 MG per tablet Take 0.5 tablets by mouth every 8 (eight) hours as needed for moderate pain.      Marland Kitchen albuterol (PROVENTIL  HFA;VENTOLIN HFA) 108 (90 BASE) MCG/ACT inhaler Inhale 2 puffs into the lungs every 6 (six) hours as needed for wheezing.  1 Inhaler  0  . diazepam (VALIUM) 5 MG tablet Take 1 tablet (5 mg total) by mouth at bedtime as needed for anxiety.  20 tablet  0  . doxycycline (VIBRA-TABS) 100 MG tablet Take 1 tablet (100 mg total) by mouth 2 (two) times daily.  20 tablet  0  . Fe Fum-Vit C-Vit B12-FA (TRIGELS-F) 460-60-0.01-1 MG CAPS capsule Take 1 capsule by mouth 2 (two) times daily.  60 capsule  1  . fluticasone (FLONASE) 50 MCG/ACT nasal spray Place 2 sprays into both nostrils daily.  16 g  2  . HYDROcodone-acetaminophen (NORCO/VICODIN) 5-325 MG per tablet Take 1-2 tablets by mouth every 6 (six) hours as needed for moderate pain or severe pain.  20 tablet  0  . HYDROcodone-homatropine (HYCODAN) 5-1.5 MG/5ML syrup Take 5 mLs by mouth every 8 (eight) hours as needed (sore throat).  120 mL  0  . ibuprofen (ADVIL,MOTRIN) 600 MG tablet Take 1 tablet (600 mg total) by mouth every 8 (eight) hours as needed for moderate pain (Take with food).  30 tablet  3  . Iron-FA-B Cmp-C-Biot-Probiotic (FUSION PLUS) CAPS Take 1 capsule by mouth daily.  30 capsule  6  . meclizine (  ANTIVERT) 25 MG tablet Take 1 tablet (25 mg total) by mouth 3 (three) times daily as needed for dizziness.  30 tablet  0  . polyethylene glycol (MIRALAX / GLYCOLAX) packet Take 17 g by mouth daily as needed for mild constipation.  14 each  2   No current facility-administered medications on file prior to visit.    Allergies  Allergen Reactions  . Penicillins Other (See Comments)    Reaction unknown    Family History  Problem Relation Age of Onset  . Hypertension Other     BP 100/62  Pulse 63  Temp(Src) 97.8 F (36.6 C) (Oral)  Ht 5\' 11"  (1.803 m)  Wt 212 lb (96.163 kg)  BMI 29.58 kg/m2  SpO2 97%  LMP 06/17/2013  Review of Systems Denies fever.    Objective:   Physical Exam VITAL SIGNS:  See vs page GENERAL: no  distress NECK: There is no palpable thyroid enlargement.  No thyroid nodule is palpable.  No palpable lymphadenopathy at the anterior neck. Skin: not diaphoretic   Lab Results  Component Value Date   TSH 0.11* 01/13/2014      Assessment & Plan:  Hyperthyroidism, improved but persistent.  She declines I-131 rx.    Patient is advised the following: Patient Instructions  blood tests are being requested for you today.  We'll contact you with results. Based on the results, I'll send a prescription for "methimazole" to your pharmacy. Please come back for a follow-up appointment in 1 month. if ever you have fever while taking methimazole, stop it and call us, because of the risk of a rare side-effect.   (i rx'ed tapazole)

## 2014-01-20 ENCOUNTER — Ambulatory Visit: Payer: Medicaid Other | Admitting: Endocrinology

## 2014-03-30 ENCOUNTER — Encounter: Payer: Self-pay | Admitting: Endocrinology

## 2014-06-08 ENCOUNTER — Encounter: Payer: Self-pay | Admitting: Family Medicine

## 2014-06-08 ENCOUNTER — Ambulatory Visit (INDEPENDENT_AMBULATORY_CARE_PROVIDER_SITE_OTHER): Payer: Medicaid Other | Admitting: Family Medicine

## 2014-06-08 VITALS — BP 122/74 | HR 63 | Temp 98.2°F | Resp 20 | Wt 202.0 lb

## 2014-06-08 DIAGNOSIS — R358 Other polyuria: Secondary | ICD-10-CM

## 2014-06-08 DIAGNOSIS — E7800 Pure hypercholesterolemia, unspecified: Secondary | ICD-10-CM

## 2014-06-08 DIAGNOSIS — F4323 Adjustment disorder with mixed anxiety and depressed mood: Secondary | ICD-10-CM

## 2014-06-08 DIAGNOSIS — E0520 Thyrotoxicosis with toxic multinodular goiter without thyrotoxic crisis or storm: Secondary | ICD-10-CM

## 2014-06-08 DIAGNOSIS — R3589 Other polyuria: Secondary | ICD-10-CM

## 2014-06-08 DIAGNOSIS — E669 Obesity, unspecified: Secondary | ICD-10-CM

## 2014-06-08 DIAGNOSIS — J4520 Mild intermittent asthma, uncomplicated: Secondary | ICD-10-CM

## 2014-06-08 LAB — COMPREHENSIVE METABOLIC PANEL
ALBUMIN: 3.9 g/dL (ref 3.5–5.2)
ALT: <8 U/L (ref 0–35)
AST: 11 U/L (ref 0–37)
Alkaline Phosphatase: 44 U/L (ref 39–117)
BUN: 7 mg/dL (ref 6–23)
CALCIUM: 9 mg/dL (ref 8.4–10.5)
CHLORIDE: 103 meq/L (ref 96–112)
CO2: 28 meq/L (ref 19–32)
CREATININE: 0.65 mg/dL (ref 0.50–1.10)
Glucose, Bld: 67 mg/dL — ABNORMAL LOW (ref 70–99)
Potassium: 3.9 mEq/L (ref 3.5–5.3)
SODIUM: 137 meq/L (ref 135–145)
TOTAL PROTEIN: 6.9 g/dL (ref 6.0–8.3)
Total Bilirubin: 0.4 mg/dL (ref 0.2–1.2)

## 2014-06-08 LAB — TSH: TSH: 1.119 u[IU]/mL (ref 0.350–4.500)

## 2014-06-08 LAB — T4, FREE: FREE T4: 0.87 ng/dL (ref 0.80–1.80)

## 2014-06-08 LAB — T3, FREE: T3, Free: 2.6 pg/mL (ref 2.3–4.2)

## 2014-06-08 MED ORDER — THERA VITAL M PO TABS: 1.0000 | ORAL_TABLET | Freq: Every day | ORAL | Status: DC

## 2014-06-08 MED ORDER — DIAZEPAM 5 MG PO TABS: 5.0000 mg | ORAL_TABLET | Freq: Every evening | ORAL | Status: DC | PRN

## 2014-06-08 MED ORDER — ALBUTEROL SULFATE HFA 108 (90 BASE) MCG/ACT IN AERS
2.0000 | INHALATION_SPRAY | Freq: Four times a day (QID) | RESPIRATORY_TRACT | Status: DC | PRN
Start: 2014-06-08 — End: 2015-08-14

## 2014-06-08 NOTE — Progress Notes (Signed)
    Subjective:    Patient ID: Monica Chase is a 39 y.o. female presenting with Follow-up  on 06/08/2014  HPI: Reports BP feels high and she is having headache.  She needs her thyroid checked. Has no h/o elevated BP. Taking 1/2 pill/day not as directed for her hyperthyroid.  Still unwilling to consider RAI. She is trying to get her niece in to see me. Needs med refill.  Review of Systems  Constitutional: Negative for fever and chills.  Respiratory: Negative for shortness of breath.   Cardiovascular: Negative for chest pain.  Gastrointestinal: Negative for nausea, vomiting and abdominal pain.  Genitourinary: Negative for dysuria.  Skin: Negative for rash.      Objective:    BP 122/74 mmHg  Pulse 63  Temp(Src) 98.2 F (36.8 C) (Oral)  Resp 20  Wt 202 lb (91.627 kg)  SpO2 99%  LMP 06/17/2013 Physical Exam  Constitutional: She is oriented to person, place, and time. She appears well-developed and well-nourished. No distress.  HENT:  Head: Normocephalic and atraumatic.  Eyes: No scleral icterus.  Neck: Neck supple.  Cardiovascular: Normal rate.   Pulmonary/Chest: Effort normal.  Abdominal: Soft.  Neurological: She is alert and oriented to person, place, and time.  Skin: Skin is warm and dry.  Psychiatric: She has a normal mood and affect.        Assessment & Plan:   Problem List Items Addressed This Visit      Unprioritized   Thyrotoxicosis    Continue methimazole     Relevant Medications      multivitamin tablet   Other Relevant Orders      TSH      T3, free      T4, free   Obesity   Relevant Orders      Comprehensive metabolic panel   Hypercholesterolemia - Primary    Strong f/h diabetes and needs to eat a lot--needs check for CBG     Other Visit Diagnoses    Polyuria        Relevant Orders       Comprehensive metabolic panel    Asthma, mild intermittent, uncomplicated        Relevant Medications       albuterol (PROVENTIL HFA;VENTOLIN HFA)  108 (90 BASE) MCG/ACT inhaler    Adjustment disorder with mixed anxiety and depressed mood        Relevant Medications       diazepam (VALIUM) tablet        Return in about 6 months (around 12/07/2014).

## 2014-06-08 NOTE — Assessment & Plan Note (Signed)
Strong f/h diabetes and needs to eat a lot--needs check for CBG

## 2014-06-08 NOTE — Assessment & Plan Note (Signed)
-  Continue methimazole ?

## 2014-06-08 NOTE — Patient Instructions (Signed)
Hypertension Hypertension, commonly called high blood pressure, is when the force of blood pumping through your arteries is too strong. Your arteries are the blood vessels that carry blood from your heart throughout your body. A blood pressure reading consists of a higher number over a lower number, such as 110/72. The higher number (systolic) is the pressure inside your arteries when your heart pumps. The lower number (diastolic) is the pressure inside your arteries when your heart relaxes. Ideally you want your blood pressure below 120/80. Hypertension forces your heart to work harder to pump blood. Your arteries may become narrow or stiff. Having hypertension puts you at risk for heart disease, stroke, and other problems.  RISK FACTORS Some risk factors for high blood pressure are controllable. Others are not.  Risk factors you cannot control include:   Race. You may be at higher risk if you are African American.  Age. Risk increases with age.  Gender. Men are at higher risk than women before age 45 years. After age 65, women are at higher risk than men. Risk factors you can control include:  Not getting enough exercise or physical activity.  Being overweight.  Getting too much fat, sugar, calories, or salt in your diet.  Drinking too much alcohol. SIGNS AND SYMPTOMS Hypertension does not usually cause signs or symptoms. Extremely high blood pressure (hypertensive crisis) may cause headache, anxiety, shortness of breath, and nosebleed. DIAGNOSIS  To check if you have hypertension, your health care provider will measure your blood pressure while you are seated, with your arm held at the level of your heart. It should be measured at least twice using the same arm. Certain conditions can cause a difference in blood pressure between your right and left arms. A blood pressure reading that is higher than normal on one occasion does not mean that you need treatment. If one blood pressure reading  is high, ask your health care provider about having it checked again. TREATMENT  Treating high blood pressure includes making lifestyle changes and possibly taking medicine. Living a healthy lifestyle can help lower high blood pressure. You may need to change some of your habits. Lifestyle changes may include:  Following the DASH diet. This diet is high in fruits, vegetables, and whole grains. It is low in salt, red meat, and added sugars.  Getting at least 2 hours of brisk physical activity every week.  Losing weight if necessary.  Not smoking.  Limiting alcoholic beverages.  Learning ways to reduce stress. If lifestyle changes are not enough to get your blood pressure under control, your health care provider may prescribe medicine. You may need to take more than one. Work closely with your health care provider to understand the risks and benefits. HOME CARE INSTRUCTIONS  Have your blood pressure rechecked as directed by your health care provider.   Take medicines only as directed by your health care provider. Follow the directions carefully. Blood pressure medicines must be taken as prescribed. The medicine does not work as well when you skip doses. Skipping doses also puts you at risk for problems.   Do not smoke.   Monitor your blood pressure at home as directed by your health care provider. SEEK MEDICAL CARE IF:   You think you are having a reaction to medicines taken.  You have recurrent headaches or feel dizzy.  You have swelling in your ankles.  You have trouble with your vision. SEEK IMMEDIATE MEDICAL CARE IF:  You develop a severe headache or confusion.    You have unusual weakness, numbness, or feel faint.  You have severe chest or abdominal pain.  You vomit repeatedly.  You have trouble breathing. MAKE SURE YOU:   Understand these instructions.  Will watch your condition.  Will get help right away if you are not doing well or get worse. Document  Released: 05/15/2005 Document Revised: 09/29/2013 Document Reviewed: 03/07/2013 [facility] Patient Information 2015 [place], [place]. This information is not intended to replace advice given to you by your health care provider. Make sure you discuss any questions you have with your health care provider. Tension Headache A tension headache is a feeling of pain, pressure, or aching often felt over the front and sides of the head. The pain can be dull or can feel tight (constricting). It is the most common type of headache. Tension headaches are not normally associated with nausea or vomiting and do not get worse with physical activity. Tension headaches can last 30 minutes to several days.  CAUSES  The exact cause is not known, but it may be caused by chemicals and hormones in the brain that lead to pain. Tension headaches often begin after stress, anxiety, or depression. Other triggers may include:  Alcohol.  Caffeine (too much or withdrawal).  Respiratory infections (colds, flu, sinus infections).  Dental problems or teeth clenching.  Fatigue.  Holding your head and neck in one position too long while using a computer. SYMPTOMS   Pressure around the head.   Dull, aching head pain.   Pain felt over the front and sides of the head.   Tenderness in the muscles of the head, neck, and shoulders. DIAGNOSIS  A tension headache is often diagnosed based on:   Symptoms.   Physical examination.   A CT scan or MRI of your head. These tests may be ordered if symptoms are severe or unusual. TREATMENT  Medicines may be given to help relieve symptoms.  HOME CARE INSTRUCTIONS   Only take over-the-counter or prescription medicines for pain or discomfort as directed by your caregiver.   Lie down in a dark, quiet room when you have a headache.   Keep a journal to find out what may be triggering your headaches. For example, write down:  What you eat and drink.  How much sleep you  get.  Any change to your diet or medicines.  Try massage or other relaxation techniques.   Ice packs or heat applied to the head and neck can be used. Use these 3 to 4 times per day for 15 to 20 minutes each time, or as needed.   Limit stress.   Sit up straight, and do not tense your muscles.   Quit smoking if you smoke.  Limit alcohol use.  Decrease the amount of caffeine you drink, or stop drinking caffeine.  Eat and exercise regularly.  Get 7 to 9 hours of sleep, or as recommended by your caregiver.  Avoid excessive use of pain medicine as recurrent headaches can occur.  SEEK MEDICAL CARE IF:   You have problems with the medicines you were prescribed.  Your medicines do not work.  You have a change from the usual headache.  You have nausea or vomiting. SEEK IMMEDIATE MEDICAL CARE IF:   Your headache becomes severe.  You have a fever.  You have a stiff neck.  You have loss of vision.  You have muscular weakness or loss of muscle control.  You lose your balance or have trouble walking.  You feel faint or pass  out.  You have severe symptoms that are different from your first symptoms. MAKE SURE YOU:   Understand these instructions.  Will watch your condition.  Will get help right away if you are not doing well or get worse. Document Released: 05/15/2005 Document Revised: 08/07/2011 Document Reviewed: 05/05/2011 [facility] Patient Information 2015 [place], [place]. This information is not intended to replace advice given to you by your health care provider. Make sure you discuss any questions you have with your health care provider.

## 2014-06-09 ENCOUNTER — Encounter: Payer: Self-pay | Admitting: Family Medicine

## 2014-07-22 ENCOUNTER — Encounter: Payer: Self-pay | Admitting: *Deleted

## 2014-07-29 ENCOUNTER — Inpatient Hospital Stay (HOSPITAL_COMMUNITY): Payer: Medicaid Other

## 2014-07-29 ENCOUNTER — Encounter (HOSPITAL_COMMUNITY): Payer: Self-pay

## 2014-07-29 ENCOUNTER — Inpatient Hospital Stay (HOSPITAL_COMMUNITY)
Admission: AD | Admit: 2014-07-29 | Discharge: 2014-07-29 | Disposition: A | Discharge status: Home / Self Care | Payer: Medicaid Other | Source: Ambulatory Visit | Attending: Obstetrics & Gynecology | Admitting: Obstetrics & Gynecology

## 2014-07-29 DIAGNOSIS — Z87891 Personal history of nicotine dependence: Secondary | ICD-10-CM | POA: Diagnosis not present

## 2014-07-29 DIAGNOSIS — N8320 Unspecified ovarian cysts: Secondary | ICD-10-CM

## 2014-07-29 DIAGNOSIS — R102 Pelvic and perineal pain: Secondary | ICD-10-CM

## 2014-07-29 DIAGNOSIS — N949 Unspecified condition associated with female genital organs and menstrual cycle: Secondary | ICD-10-CM | POA: Diagnosis not present

## 2014-07-29 DIAGNOSIS — N83202 Unspecified ovarian cyst, left side: Secondary | ICD-10-CM

## 2014-07-29 DIAGNOSIS — R109 Unspecified abdominal pain: Secondary | ICD-10-CM | POA: Diagnosis present

## 2014-07-29 LAB — WET PREP, GENITAL
Clue Cells Wet Prep HPF POC: NONE SEEN
TRICH WET PREP: NONE SEEN
YEAST WET PREP: NONE SEEN

## 2014-07-29 LAB — URINALYSIS, ROUTINE W REFLEX MICROSCOPIC
Bilirubin Urine: NEGATIVE
GLUCOSE, UA: NEGATIVE mg/dL
Hgb urine dipstick: NEGATIVE
Ketones, ur: NEGATIVE mg/dL
LEUKOCYTES UA: NEGATIVE
Nitrite: NEGATIVE
PROTEIN: NEGATIVE mg/dL
Specific Gravity, Urine: 1.02 (ref 1.005–1.030)
Urobilinogen, UA: 0.2 mg/dL (ref 0.0–1.0)
pH: 6 (ref 5.0–8.0)

## 2014-07-29 MED ORDER — KETOROLAC TROMETHAMINE 60 MG/2ML IM SOLN
60.0000 mg | Freq: Once | INTRAMUSCULAR | Status: AC
  Administered 2014-07-29: 60 mg via INTRAMUSCULAR
  Filled 2014-07-29: qty 2

## 2014-07-29 NOTE — Discharge Instructions (Signed)
Ovarian Cyst An ovarian cyst is a fluid-filled sac that forms on an ovary. The ovaries are small organs that produce eggs in women. Various types of cysts can form on the ovaries. Most are not cancerous. Many do not cause problems, and they often go away on their own. Some may cause symptoms and require treatment. Common types of ovarian cysts include:  Functional cysts--These cysts may occur every month during the menstrual cycle. This is normal. The cysts usually go away with the next menstrual cycle if the woman does not get pregnant. Usually, there are no symptoms with a functional cyst.  Endometrioma cysts--These cysts form from the tissue that lines the uterus. They are also called "chocolate cysts" because they become filled with blood that turns brown. This type of cyst can cause pain in the lower abdomen during intercourse and with your menstrual period.  Cystadenoma cysts--This type develops from the cells on the outside of the ovary. These cysts can get very big and cause lower abdomen pain and pain with intercourse. This type of cyst can twist on itself, cut off its blood supply, and cause severe pain. It can also easily rupture and cause a lot of pain.  Dermoid cysts--This type of cyst is sometimes found in both ovaries. These cysts may contain different kinds of body tissue, such as skin, teeth, hair, or cartilage. They usually do not cause symptoms unless they get very big.  Theca lutein cysts--These cysts occur when too much of a certain hormone (human chorionic gonadotropin) is produced and overstimulates the ovaries to produce an egg. This is most common after procedures used to assist with the conception of a baby (in vitro fertilization). CAUSES   Fertility drugs can cause a condition in which multiple large cysts are formed on the ovaries. This is called ovarian hyperstimulation syndrome.  A condition called polycystic ovary syndrome can cause hormonal imbalances that can lead to  nonfunctional ovarian cysts. SIGNS AND SYMPTOMS  Many ovarian cysts do not cause symptoms. If symptoms are present, they may include:  Pelvic pain or pressure.  Pain in the lower abdomen.  Pain during sexual intercourse.  Increasing girth (swelling) of the abdomen.  Abnormal menstrual periods.  Increasing pain with menstrual periods.  Stopping having menstrual periods without being pregnant. DIAGNOSIS  These cysts are commonly found during a routine or annual pelvic exam. Tests may be ordered to find out more about the cyst. These tests may include:  Ultrasound.  X-ray of the pelvis.  CT scan.  MRI.  Blood tests. TREATMENT  Many ovarian cysts go away on their own without treatment. Your health care provider may want to check your cyst regularly for 2-3 months to see if it changes. For women in menopause, it is particularly important to monitor a cyst closely because of the higher rate of ovarian cancer in menopausal women. When treatment is needed, it may include any of the following:  A procedure to drain the cyst (aspiration). This may be done using a long needle and ultrasound. It can also be done through a laparoscopic procedure. This involves using a thin, lighted tube with a tiny camera on the end (laparoscope) inserted through a small incision.  Surgery to remove the whole cyst. This may be done using laparoscopic surgery or an open surgery involving a larger incision in the lower abdomen.  Hormone treatment or birth control pills. These methods are sometimes used to help dissolve a cyst. HOME CARE INSTRUCTIONS   Only take over-the-counter   or prescription medicines as directed by your health care provider.  Follow up with your health care provider as directed.  Get regular pelvic exams and Pap tests. SEEK MEDICAL CARE IF:   Your periods are late, irregular, or painful, or they stop.  Your pelvic pain or abdominal pain does not go away.  Your abdomen becomes  larger or swollen.  You have pressure on your bladder or trouble emptying your bladder completely.  You have pain during sexual intercourse.  You have feelings of fullness, pressure, or discomfort in your stomach.  You lose weight for no apparent reason.  You feel generally ill.  You become constipated.  You lose your appetite.  You develop acne.  You have an increase in body and facial hair.  You are gaining weight, without changing your exercise and eating habits.  You think you are pregnant. SEEK IMMEDIATE MEDICAL CARE IF:   You have increasing abdominal pain.  You feel sick to your stomach (nauseous), and you throw up (vomit).  You develop a fever that comes on suddenly.  You have abdominal pain during a bowel movement.  Your menstrual periods become heavier than usual. MAKE SURE YOU:  Understand these instructions.  Will watch your condition.  Will get help right away if you are not doing well or get worse. Document Released: 05/15/2005 Document Revised: 05/20/2013 Document Reviewed: 01/20/2013 Exit[employer] Patient Information 2015 [employer]. This information is not intended to replace advice given to you by your health care provider. Make sure you discuss any questions you have with your health care provider.  

## 2014-07-29 NOTE — MAU Note (Signed)
Pt presents to MAU with complaints of pain in her vaginal area and noticed a red bump today while taking a bath. Denies any recent change in partners.

## 2014-07-29 NOTE — MAU Provider Note (Signed)
History     CSN: [id number]  Arrival date and time: 07/29/14 1803   First Provider Initiated Contact with Patient 07/29/14 2026      No chief complaint on file.  HPI  Monica Chase is a 39 y.o. [phone] who presents today with pressure and pain in her bottom and lower abdomen. She states that she has had this pain for about 4 days. She states that she has had pain with urination today. She has had some yellow discharge. She states that she has had infections in the past from taking tub baths, and she has recently taken a tub bath. She states that today she noticed a small boil. She states that she has not had intercourse in more than 4 years, and declines STD testing today.   Past Medical History  Diagnosis Date  . HYPERTHYROIDISM 07/20/2009  . DEPRESSION, MAJOR, RECURRENT 07/26/2006  . ANXIETY 07/26/2006  . ASTHMA, INTERMITTENT 07/26/2006  . HYPERGLYCEMIA 09/28/2009  . CONSTIPATION, CHRONIC 06/22/2010  . Dizziness 11/09/2010  . OBESITY 01/31/2008  . Fibroid     Past Surgical History  Procedure Laterality Date  . Tubal ligation  2008  . Iud removal  10/07    Mirena removed 05/10/2004  . Pilonidal cyst excision    . Vaginal delivery      X 4  . Vaginal hysterectomy N/A 07/07/2013    Procedure: HYSTERECTOMY VAGINAL;  Surgeon: Donnamae Jude, MD;  Location: [facility];  Service: Gynecology;  Laterality: N/A;  . Bilateral salpingectomy Bilateral 07/07/2013    Procedure: BILATERAL SALPINGECTOMY;  Surgeon: Donnamae Jude, MD;  Location: [facility];  Service: Gynecology;  Laterality: Bilateral;    Family History  Problem Relation Age of Onset  . Hypertension Other     History  Substance Use Topics  . Smoking status: Former Smoker    Quit date: 11/08/2005  . Smokeless tobacco: Never Used  . Alcohol Use: No    Allergies:  Allergies  Allergen Reactions  . Penicillins Other (See Comments)    Reaction unknown    Prescriptions prior to admission  Medication Sig Dispense Refill Last Dose   . albuterol (PROVENTIL HFA;VENTOLIN HFA) 108 (90 BASE) MCG/ACT inhaler Inhale 2 puffs into the lungs every 6 (six) hours as needed for wheezing. 1 Inhaler 0 Past Week at Unknown time  . aspirin 325 MG tablet Take 325-650 mg by mouth every 6 (six) hours as needed for mild pain.   Past Week at Unknown time  . cholecalciferol (VITAMIN D) 1000 UNITS tablet Take 2,000 Units by mouth daily.   07/29/2014 at Unknown time  . methimazole (TAPAZOLE) 10 MG tablet Take 1 tablet (10 mg total) by mouth daily. 30 tablet 1 07/28/2014 at Unknown time  . Multiple Vitamins-Minerals (MULTIVITAMIN) tablet Take 1 tablet by mouth daily. 90 tablet 3 07/29/2014 at Unknown time  . diazepam (VALIUM) 5 MG tablet Take 1 tablet (5 mg total) by mouth at bedtime as needed for anxiety. (Patient not taking: Reported on 07/29/2014) 20 tablet 0 Not Taking at Unknown time  . ibuprofen (ADVIL,MOTRIN) 600 MG tablet Take 1 tablet (600 mg total) by mouth every 8 (eight) hours as needed for moderate pain (Take with food). (Patient not taking: Reported on 07/29/2014) 30 tablet 3 Not Taking at Unknown time  . meclizine (ANTIVERT) 25 MG tablet Take 1 tablet (25 mg total) by mouth 3 (three) times daily as needed for dizziness. (Patient not taking: Reported on 07/29/2014) 30 tablet 0 Not Taking at  Unknown time    ROS Physical Exam   Blood pressure 112/52, pulse 58, temperature 98.9 F (37.2 C), temperature source Oral, resp. rate 18, last menstrual period 06/17/2013.  Physical Exam  Nursing note and vitals reviewed. Constitutional: She is oriented to person, place, and time. She appears well-developed and well-nourished. No distress.  Cardiovascular: Normal rate.   Respiratory: Effort normal.  GI: Soft. There is no tenderness. There is no rebound.  Genitourinary:   External: small inflamed follicle on left external genitalia.  Vagina: small amount of white discharge Cervix: SA Uterus: SA Adnexa: Left sided tenderness   Neurological: She is  alert and oriented to person, place, and time.  Skin: Skin is warm and dry.  Psychiatric: She has a normal mood and affect.    MAU Course  Procedures  Results for orders placed or performed during the hospital encounter of 07/29/14 (from the past 24 hour(s))  Urinalysis, Routine w reflex microscopic     Status: None   Collection Time: 07/29/14  6:32 PM  Result Value Ref Range   Color, Urine YELLOW YELLOW   APPearance CLEAR CLEAR   Specific Gravity, Urine 1.020 1.005 - 1.030   pH 6.0 5.0 - 8.0   Glucose, UA NEGATIVE NEGATIVE mg/dL   Hgb urine dipstick NEGATIVE NEGATIVE   Bilirubin Urine NEGATIVE NEGATIVE   Ketones, ur NEGATIVE NEGATIVE mg/dL   Protein, ur NEGATIVE NEGATIVE mg/dL   Urobilinogen, UA 0.2 0.0 - 1.0 mg/dL   Nitrite NEGATIVE NEGATIVE   Leukocytes, UA NEGATIVE NEGATIVE  Wet prep, genital     Status: Abnormal   Collection Time: 07/29/14  8:38 PM  Result Value Ref Range   Yeast Wet Prep HPF POC NONE SEEN NONE SEEN   Trich, Wet Prep NONE SEEN NONE SEEN   Clue Cells Wet Prep HPF POC NONE SEEN NONE SEEN   WBC, Wet Prep HPF POC MODERATE (A) NONE SEEN   US Transvaginal Non-ob  07/29/2014   CLINICAL DATA:  Acute onset of left-sided pelvic pain. Initial encounter.  EXAM: TRANSABDOMINAL AND TRANSVAGINAL ULTRASOUND OF PELVIS  TECHNIQUE: Both transabdominal and transvaginal ultrasound examinations of the pelvis were performed. Transabdominal technique was performed for global imaging of the pelvis including ovaries, adnexal regions, and pelvic cul-de-sac. It was necessary to proceed with endovaginal exam following the transabdominal exam to visualize the ovaries in greater detail.  COMPARISON:  Pelvic ultrasound performed 04/09/2013  FINDINGS: Uterus  Status post hysterectomy.  Right ovary  Measurements: 3.3 x 2.6 x 2.2 cm. Normal appearance/no adnexal mass.  Left ovary  Measurements: 4.2 x 4.1 x 4.0 cm. Multiple cystic foci with internal echoes are noted within the left ovary; the  appearance is suggestive of organizing hemorrhagic cysts, measuring up to 3.1 cm in size.  Other findings  Trace free fluid is seen adjacent to the left ovary.  IMPRESSION: 1. Multiple cystic foci with internal echoes, at the left ovary. The appearance is suggestive of organizing hemorrhagic cysts, measuring up to 3.1 cm in size. Trace free fluid noted adjacent to the left ovary. This may reflect leakage from the hemorrhagic cysts, and may explain the patient's symptoms. 2. Status post hysterectomy. Right ovary is unremarkable in appearance.   Electronically Signed   By: Garald Balding M.D.   On: 07/29/2014 21:20   US Pelvis Complete  07/29/2014   CLINICAL DATA:  Acute onset of left-sided pelvic pain. Initial encounter.  EXAM: TRANSABDOMINAL AND TRANSVAGINAL ULTRASOUND OF PELVIS  TECHNIQUE: Both transabdominal and transvaginal ultrasound  examinations of the pelvis were performed. Transabdominal technique was performed for global imaging of the pelvis including ovaries, adnexal regions, and pelvic cul-de-sac. It was necessary to proceed with endovaginal exam following the transabdominal exam to visualize the ovaries in greater detail.  COMPARISON:  Pelvic ultrasound performed 04/09/2013  FINDINGS: Uterus  Status post hysterectomy.  Right ovary  Measurements: 3.3 x 2.6 x 2.2 cm. Normal appearance/no adnexal mass.  Left ovary  Measurements: 4.2 x 4.1 x 4.0 cm. Multiple cystic foci with internal echoes are noted within the left ovary; the appearance is suggestive of organizing hemorrhagic cysts, measuring up to 3.1 cm in size.  Other findings  Trace free fluid is seen adjacent to the left ovary.  IMPRESSION: 1. Multiple cystic foci with internal echoes, at the left ovary. The appearance is suggestive of organizing hemorrhagic cysts, measuring up to 3.1 cm in size. Trace free fluid noted adjacent to the left ovary. This may reflect leakage from the hemorrhagic cysts, and may explain the patient's symptoms. 2. Status  post hysterectomy. Right ovary is unremarkable in appearance.   Electronically Signed   By: Garald Balding M.D.   On: 07/29/2014 21:20     Assessment and Plan   1. Cyst of left ovary   2. Acute pelvic pain, female    DC home Comfort measures reviewed Return to MAU as needed  Follow-up Information    Follow up with [facility].   Specialty:  Family Medicine   Contact information:   [address] [id number] mc [place] [phone]       Mathis Bud 07/29/2014, 8:27 PM

## 2014-08-05 ENCOUNTER — Ambulatory Visit (INDEPENDENT_AMBULATORY_CARE_PROVIDER_SITE_OTHER): Payer: Medicaid Other | Admitting: Family Medicine

## 2014-08-05 ENCOUNTER — Ambulatory Visit (HOSPITAL_COMMUNITY)
Admission: RE | Admit: 2014-08-05 | Discharge: 2014-08-05 | Disposition: A | Discharge status: Home / Self Care | Payer: Medicaid Other | Source: Ambulatory Visit | Attending: Family Medicine | Admitting: Family Medicine

## 2014-08-05 ENCOUNTER — Encounter: Payer: Self-pay | Admitting: *Deleted

## 2014-08-05 ENCOUNTER — Encounter: Payer: Self-pay | Admitting: Family Medicine

## 2014-08-05 VITALS — BP 116/68 | HR 71 | Temp 98.0°F | Ht 71.0 in | Wt 198.0 lb

## 2014-08-05 DIAGNOSIS — R079 Chest pain, unspecified: Secondary | ICD-10-CM | POA: Diagnosis not present

## 2014-08-05 DIAGNOSIS — F439 Reaction to severe stress, unspecified: Secondary | ICD-10-CM | POA: Insufficient documentation

## 2014-08-05 DIAGNOSIS — Z658 Other specified problems related to psychosocial circumstances: Secondary | ICD-10-CM

## 2014-08-05 DIAGNOSIS — F39 Unspecified mood [affective] disorder: Secondary | ICD-10-CM

## 2014-08-05 DIAGNOSIS — R0789 Other chest pain: Secondary | ICD-10-CM

## 2014-08-05 NOTE — Progress Notes (Signed)
   Pt in nurse clinic this AM requesting to be seen by a provider for stress, fatigue and left side pain.  Pt stated she went to [facility] recently for the pain; was told she had a cyst on her left ovary.  Pt stated she is very stressed and tired and feel like she is about to break down.  Pt denies wanting to harm herself or someone else.  Pt advised that there was no open appts this AM, but she could see a provider this afternoon.  Pt opt to wait for someone to cancel or not show for their appt.  Pt advised it could be a long wait. Pt stated understanding.  Derl Barrow, RN
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
## 2014-08-05 NOTE — Patient Instructions (Addendum)
Your chest pain is most likely related to stress. It sounds like you have a great relationship with Dr. Kennon Rounds, and it would be a really good idea to follow-up with her soon to discuss medication and changing therapy options. Please seek immediate care if you have any life-threatening issues like feeling like you want to harm yourself or others. The [facility] 24 hour helpline is a [phone]. Best,  Hilton Sinclair, MD
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
## 2014-08-05 NOTE — Assessment & Plan Note (Signed)
Chest pain is atypical, nonexertional, and most likely related to anxiety/stress. Afebrile, vital signs stable, normal cardiac exam in clinic today. Reported history of cardiac monitoring was negative. EKG in clinic with sinus bradycardia but no signs of ischemia. No current chest pain. -Follow-up when necessary worsening of symptoms or persistent chest pain, at which point could consider increased workup with stress test / referral to cardiology.

## 2014-08-06 NOTE — Progress Notes (Signed)
I was the preceptor on the day of this visit.   Shelden Raborn MD  

## 2014-08-24 ENCOUNTER — Other Ambulatory Visit: Payer: Self-pay

## 2014-08-24 MED ORDER — METHIMAZOLE 10 MG PO TABS: 10.0000 mg | ORAL_TABLET | Freq: Every day | ORAL | Status: DC

## 2014-09-05 ENCOUNTER — Emergency Department (HOSPITAL_COMMUNITY)
Admission: EM | Admit: 2014-09-05 | Discharge: 2014-09-05 | Disposition: A | Discharge status: Home / Self Care | Payer: Medicaid Other | Attending: Emergency Medicine | Admitting: Emergency Medicine

## 2014-09-05 ENCOUNTER — Encounter (HOSPITAL_COMMUNITY): Payer: Self-pay | Admitting: Family Medicine

## 2014-09-05 DIAGNOSIS — Z87891 Personal history of nicotine dependence: Secondary | ICD-10-CM | POA: Diagnosis not present

## 2014-09-05 DIAGNOSIS — J45909 Unspecified asthma, uncomplicated: Secondary | ICD-10-CM | POA: Diagnosis not present

## 2014-09-05 DIAGNOSIS — Z8742 Personal history of other diseases of the female genital tract: Secondary | ICD-10-CM | POA: Insufficient documentation

## 2014-09-05 DIAGNOSIS — Z8719 Personal history of other diseases of the digestive system: Secondary | ICD-10-CM | POA: Insufficient documentation

## 2014-09-05 DIAGNOSIS — E669 Obesity, unspecified: Secondary | ICD-10-CM | POA: Insufficient documentation

## 2014-09-05 DIAGNOSIS — Z79899 Other long term (current) drug therapy: Secondary | ICD-10-CM | POA: Insufficient documentation

## 2014-09-05 DIAGNOSIS — E039 Hypothyroidism, unspecified: Secondary | ICD-10-CM | POA: Insufficient documentation

## 2014-09-05 DIAGNOSIS — L249 Irritant contact dermatitis, unspecified cause: Secondary | ICD-10-CM | POA: Diagnosis not present

## 2014-09-05 DIAGNOSIS — Z880 Allergy status to penicillin: Secondary | ICD-10-CM | POA: Insufficient documentation

## 2014-09-05 DIAGNOSIS — Z7982 Long term (current) use of aspirin: Secondary | ICD-10-CM | POA: Diagnosis not present

## 2014-09-05 DIAGNOSIS — Z8659 Personal history of other mental and behavioral disorders: Secondary | ICD-10-CM | POA: Diagnosis not present

## 2014-09-05 DIAGNOSIS — R21 Rash and other nonspecific skin eruption: Secondary | ICD-10-CM | POA: Diagnosis present

## 2014-09-05 MED ORDER — HYDROCORTISONE 2.5 % EX LOTN: TOPICAL_LOTION | Freq: Two times a day (BID) | CUTANEOUS | Status: DC

## 2014-09-05 NOTE — ED Notes (Signed)
Pt here for rash and itching to right hand. 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

## 2014-09-05 NOTE — Discharge Instructions (Signed)

## 2014-09-05 NOTE — ED Notes (Signed)
Pt reports she cleans with undiluted bleach . Pt now reports blisters to hands.

## 2014-09-05 NOTE — ED Provider Notes (Signed)
CSN: [id number]     Arrival date & time 09/05/14  1736 History  This chart was scribed for non-physician practitioner, Will Porfirio Mylar, PA-C working with Pamella Pert, MD by Judithann Sauger, ED Scribe. The patient was seen in room TR06C/TR06C and the patient's care was started at 7:33 PM      Chief Complaint  Patient presents with  . Rash   The history is provided by the patient. No language interpreter was used.   HPI Comments: Monica Chase is a 39 y.o. female who presents to the Emergency Department complaining of a chemical burn on her right hand from Clorox bleach 3 days ago. She reports that she was not wearing gloves while cleaning with bleach and water. She now complains of an itchy rash on her right hand. She reports using anti-itch cream with no relief. She denies CP, cough, SOB, fever, chills, abdominal pain, or N/V/D. She denies any new lotions, soaps, or detergents. She denies pain or bleeding.   Past Medical History  Diagnosis Date  . HYPERTHYROIDISM 07/20/2009  . DEPRESSION, MAJOR, RECURRENT 07/26/2006  . ANXIETY 07/26/2006  . ASTHMA, INTERMITTENT 07/26/2006  . HYPERGLYCEMIA 09/28/2009  . CONSTIPATION, CHRONIC 06/22/2010  . Dizziness 11/09/2010  . OBESITY 01/31/2008  . Fibroid    Past Surgical History  Procedure Laterality Date  . Tubal ligation  2008  . Iud removal  10/07    Mirena removed 05/10/2004  . Pilonidal cyst excision    . Vaginal delivery      X 4  . Vaginal hysterectomy N/A 07/07/2013    Procedure: HYSTERECTOMY VAGINAL;  Surgeon: Donnamae Jude, MD;  Location: [facility];  Service: Gynecology;  Laterality: N/A;  . Bilateral salpingectomy Bilateral 07/07/2013    Procedure: BILATERAL SALPINGECTOMY;  Surgeon: Donnamae Jude, MD;  Location: [facility];  Service: Gynecology;  Laterality: Bilateral;   Family History  Problem Relation Age of Onset  . Hypertension Other    History  Substance Use Topics  . Smoking status: Former Smoker    Quit date: 11/08/2005  . Smokeless  tobacco: Never Used  . Alcohol Use: No   OB History    Gravida Para Term Preterm AB TAB SAB Ectopic Multiple Living   4 4 4       4      Review of Systems  Constitutional: Negative for fever and chills.  HENT: Negative for congestion, sore throat and trouble swallowing.   Eyes: Negative for redness.  Respiratory: Negative for cough and shortness of breath.   Cardiovascular: Negative for chest pain.  Gastrointestinal: Negative for nausea, vomiting, abdominal pain and diarrhea.  Skin: Positive for rash.      Allergies  Penicillins  Home Medications   Prior to Admission medications   Medication Sig Start Date End Date Taking? Authorizing Provider  albuterol (PROVENTIL HFA;VENTOLIN HFA) 108 (90 BASE) MCG/ACT inhaler Inhale 2 puffs into the lungs every 6 (six) hours as needed for wheezing. 06/08/14   Donnamae Jude, MD  aspirin 325 MG tablet Take 325-650 mg by mouth every 6 (six) hours as needed for mild pain.    Historical Provider, MD  cholecalciferol (VITAMIN D) 1000 UNITS tablet Take 2,000 Units by mouth daily.    Historical Provider, MD  hydrocortisone 2.5 % lotion Apply topically 2 (two) times daily. To the affected area for 5 days. 09/05/14   Waynetta Pean, PA-C  ibuprofen (ADVIL,MOTRIN) 600 MG tablet Take 1 tablet (600 mg total) by mouth every 8 (eight) hours as  needed for moderate pain (Take with food). Patient not taking: Reported on 07/29/2014 06/29/13   Donnamae Jude, MD  methimazole (TAPAZOLE) 10 MG tablet Take 1 tablet (10 mg total) by mouth daily. APPOINTMENT NEEDED FOR FURTHER REFILLS. 08/24/14   Renato Shin, MD  Multiple Vitamins-Minerals (MULTIVITAMIN) tablet Take 1 tablet by mouth daily. 06/08/14   Donnamae Jude, MD   BP 116/64 mmHg  Pulse 90  Temp(Src) 97.9 F (36.6 C) (Oral)  Resp 14  SpO2 99%  LMP 06/17/2013 Physical Exam  Constitutional: She is oriented to person, place, and time. She appears well-developed and well-nourished. No distress.  HENT:  Head:  Normocephalic and atraumatic.  Mouth/Throat: Oropharynx is clear and moist. No oropharyngeal exudate.  Eyes: Conjunctivae and EOM are normal. Pupils are equal, round, and reactive to light. Right eye exhibits no discharge. Left eye exhibits no discharge.  Neck: Neck supple. No tracheal deviation present.  Cardiovascular: Normal rate, regular rhythm, normal heart sounds and intact distal pulses.   Bilateral radial pulses are intact.  Pulmonary/Chest: Effort normal. No respiratory distress.  Musculoskeletal: Normal range of motion.  Neurological: She is alert and oriented to person, place, and time. Coordination normal.  Skin: Skin is warm and dry. Rash noted. She is not diaphoretic. There is erythema. No pallor.  Erythema and dry skin overlying the dorsal aspect of her right hand. No bleeding or weeping.   Psychiatric: She has a normal mood and affect. Her behavior is normal.  Nursing note and vitals reviewed.   ED Course  Procedures (including critical care time) DIAGNOSTIC STUDIES: Oxygen Saturation is 95% on RA, adequate by my interpretation.    COORDINATION OF CARE: 7:39 PM- Pt advised of plan for treatment and pt agrees.    Labs Review Labs Reviewed - No data to display  Imaging Review No results found.   EKG Interpretation None      Filed Vitals:   09/05/14 1743 09/05/14 1944  BP: 116/60 116/64  Pulse: 70 90  Temp: 97.8 F (36.6 C) 97.9 F (36.6 C)  TempSrc: Oral Oral  Resp: 20 14  SpO2: 95% 99%     MDM   Meds given in ED:  Medications - No data to display  New Prescriptions   HYDROCORTISONE 2.5 % LOTION    Apply topically 2 (two) times daily. To the affected area for 5 days.    Final diagnoses:  Irritant contact dermatitis   This is a 39 year old female who presents the emergency department complaining of a rash overlying the dorsal aspect of her right hand after using bleach while cleaning 3 days ago. This has persisted for 3 days. She denies other  rash. She denies changes to her soaps, lotions, perfumes or detergents recently. The patient is afebrile and nontoxic appearing. She does have erythema and dry skin overlying the dorsal aspect of her right hand. Exam and history consistent with irritant contact Charisse March. We'll discharge with prescription for hydrocortisone 2-1/2% lotion. I advised her to not use bleach without using gloves.  I advised to avoid any contact with bleach to her skin. I advised the patient to follow-up with their primary care provider this week. I advised the patient to return to the emergency department with new or worsening symptoms or new concerns. The patient verbalized understanding and agreement with plan.   I personally performed the services described in this documentation, which was scribed in my presence. The recorded information has been reviewed and is accurate.  Waynetta Pean, PA-C 09/05/14 1950  Pamella Pert, MD 09/06/14 1328

## 2014-10-10 ENCOUNTER — Emergency Department (HOSPITAL_COMMUNITY)
Admission: EM | Admit: 2014-10-10 | Discharge: 2014-10-10 | Disposition: A | Discharge status: Home / Self Care | Payer: Medicaid Other | Source: Home / Self Care | Attending: Family Medicine | Admitting: Family Medicine

## 2014-10-10 DIAGNOSIS — J309 Allergic rhinitis, unspecified: Secondary | ICD-10-CM | POA: Diagnosis not present

## 2014-10-10 DIAGNOSIS — R0981 Nasal congestion: Secondary | ICD-10-CM | POA: Diagnosis not present

## 2014-10-10 MED ORDER — MONTELUKAST SODIUM 10 MG PO TABS: 10.0000 mg | ORAL_TABLET | Freq: Every day | ORAL | Status: DC

## 2014-10-10 MED ORDER — IPRATROPIUM BROMIDE 0.06 % NA SOLN: 2.0000 | Freq: Four times a day (QID) | NASAL | Status: DC

## 2014-10-10 MED ORDER — LEVOCETIRIZINE DIHYDROCHLORIDE 5 MG PO TABS: 5.0000 mg | ORAL_TABLET | Freq: Every evening | ORAL | Status: DC

## 2014-10-10 NOTE — ED Notes (Signed)
C/o facial pain, HA, bilateral ear ache and dizziness. Pt has been using albuterol inh more in the past few days. X 3-4 days

## 2014-10-10 NOTE — ED Provider Notes (Signed)
CSN: [id number]     Arrival date & time 10/10/14  1612 History   First MD Initiated Contact with Patient 10/10/14 1719     Chief Complaint  Patient presents with  . Facial Pain   (Consider location/radiation/quality/duration/timing/severity/associated sxs/prior Treatment) HPI Comments: Several days of sneezing, nasal congestion, cough, wheezing, sinus pressure, post nasal drainage all of which she feels is exacerbating her asthma and not responding favorably to OTC Claritin and Flonase.   The history is provided by the patient.    Past Medical History  Diagnosis Date  . HYPERTHYROIDISM 07/20/2009  . DEPRESSION, MAJOR, RECURRENT 07/26/2006  . ANXIETY 07/26/2006  . ASTHMA, INTERMITTENT 07/26/2006  . HYPERGLYCEMIA 09/28/2009  . CONSTIPATION, CHRONIC 06/22/2010  . Dizziness 11/09/2010  . OBESITY 01/31/2008  . Fibroid    Past Surgical History  Procedure Laterality Date  . Tubal ligation  2008  . Iud removal  10/07    Mirena removed 05/10/2004  . Pilonidal cyst excision    . Vaginal delivery      X 4  . Vaginal hysterectomy N/A 07/07/2013    Procedure: HYSTERECTOMY VAGINAL;  Surgeon: Donnamae Jude, MD;  Location: [facility];  Service: Gynecology;  Laterality: N/A;  . Bilateral salpingectomy Bilateral 07/07/2013    Procedure: BILATERAL SALPINGECTOMY;  Surgeon: Donnamae Jude, MD;  Location: [facility];  Service: Gynecology;  Laterality: Bilateral;   Family History  Problem Relation Age of Onset  . Hypertension Other    History  Substance Use Topics  . Smoking status: Former Smoker    Quit date: 11/08/2005  . Smokeless tobacco: Never Used  . Alcohol Use: No   OB History    Gravida Para Term Preterm AB TAB SAB Ectopic Multiple Living   4 4 4       4      Review of Systems  All other systems reviewed and are negative.   Allergies  Penicillins  Home Medications   Prior to Admission medications   Medication Sig Start Date End Date Taking? Authorizing Provider  albuterol (PROVENTIL  HFA;VENTOLIN HFA) 108 (90 BASE) MCG/ACT inhaler Inhale 2 puffs into the lungs every 6 (six) hours as needed for wheezing. 06/08/14   Donnamae Jude, MD  aspirin 325 MG tablet Take 325-650 mg by mouth every 6 (six) hours as needed for mild pain.    Historical Provider, MD  cholecalciferol (VITAMIN D) 1000 UNITS tablet Take 2,000 Units by mouth daily.    Historical Provider, MD  hydrocortisone 2.5 % lotion Apply topically 2 (two) times daily. To the affected area for 5 days. 09/05/14   Waynetta Pean, PA-C  ibuprofen (ADVIL,MOTRIN) 600 MG tablet Take 1 tablet (600 mg total) by mouth every 8 (eight) hours as needed for moderate pain (Take with food). Patient not taking: Reported on 07/29/2014 06/29/13   Donnamae Jude, MD  ipratropium (ATROVENT) 0.06 % nasal spray Place 2 sprays into both nostrils 4 (four) times daily. For nasal congestion 10/10/14   Lutricia Feil, PA  levocetirizine (XYZAL) 5 MG tablet Take 1 tablet (5 mg total) by mouth every evening. 10/10/14   Lutricia Feil, PA  methimazole (TAPAZOLE) 10 MG tablet Take 1 tablet (10 mg total) by mouth daily. APPOINTMENT NEEDED FOR FURTHER REFILLS. 08/24/14   Renato Shin, MD  montelukast (SINGULAIR) 10 MG tablet Take 1 tablet (10 mg total) by mouth at bedtime. 10/10/14   Lutricia Feil, PA  Multiple Vitamins-Minerals (MULTIVITAMIN) tablet Take 1 tablet by  mouth daily. 06/08/14   Donnamae Jude, MD   BP 105/65 mmHg  Pulse 68  Temp(Src) 98.9 F (37.2 C) (Oral)  Resp 16  SpO2 99%  LMP 06/17/2013 Physical Exam  Constitutional: She is oriented to person, place, and time. She appears well-developed and well-nourished. No distress.  HENT:  Head: Normocephalic and atraumatic.  Right Ear: Hearing, tympanic membrane, external ear and ear canal normal.  Left Ear: Hearing, tympanic membrane, external ear and ear canal normal.  Nose: Nose normal.  Mouth/Throat: Uvula is midline, oropharynx is clear and moist and mucous membranes are normal.   Eyes: Conjunctivae are normal. Right eye exhibits no discharge. Left eye exhibits no discharge.  Neck: Normal range of motion. Neck supple.  Cardiovascular: Normal rate, regular rhythm and normal heart sounds.   Pulmonary/Chest: Effort normal and breath sounds normal. No stridor. No respiratory distress. She has no wheezes.  Musculoskeletal: Normal range of motion.  Lymphadenopathy:    She has no cervical adenopathy.  Neurological: She is alert and oriented to person, place, and time.  Skin: Skin is warm and dry.  Psychiatric: She has a normal mood and affect. Her behavior is normal.  Nursing note and vitals reviewed.   ED Course  Procedures (including critical care time) Labs Review Labs Reviewed - No data to display  Imaging Review No results found.   MDM   1. Allergic rhinitis, unspecified allergic rhinitis type   2. Nasal congestion   D/C Claritin and Flonase Atrovent nasal spray Xyzal Singulair PCP follow up if no improvement  Lutricia Feil, PA 10/10/14 [phone]

## 2014-10-10 NOTE — Discharge Instructions (Signed)
Allergic Rhinitis °Allergic rhinitis is when the mucous membranes in the nose respond to allergens. Allergens are particles in the air that cause your body to have an allergic reaction. This causes you to release allergic antibodies. Through a chain of events, these eventually cause you to release histamine into the blood stream. Although meant to protect the body, it is this release of histamine that causes your discomfort, such as frequent sneezing, congestion, and an itchy, runny nose.  °CAUSES  °Seasonal allergic rhinitis (hay fever) is caused by pollen allergens that may come from grasses, trees, and weeds. Year-round allergic rhinitis (perennial allergic rhinitis) is caused by allergens such as house dust mites, pet dander, and mold spores.  °SYMPTOMS  °· Nasal stuffiness (congestion). °· Itchy, runny nose with sneezing and tearing of the eyes. °DIAGNOSIS  °Your health care provider can help you determine the allergen or allergens that trigger your symptoms. If you and your health care provider are unable to determine the allergen, skin or blood testing may be used. °TREATMENT  °Allergic rhinitis does not have a cure, but it can be controlled by: °· Medicines and allergy shots (immunotherapy). °· Avoiding the allergen. °Hay fever may often be treated with antihistamines in pill or nasal spray forms. Antihistamines block the effects of histamine. There are over-the-counter medicines that may help with nasal congestion and swelling around the eyes. Check with your health care provider before taking or giving this medicine.  °If avoiding the allergen or the medicine prescribed do not work, there are many new medicines your health care provider can prescribe. Stronger medicine may be used if initial measures are ineffective. Desensitizing injections can be used if medicine and avoidance does not work. Desensitization is when a patient is given ongoing shots until the body becomes less sensitive to the allergen.  Make sure you follow up with your health care provider if problems continue. °HOME CARE INSTRUCTIONS °It is not possible to completely avoid allergens, but you can reduce your symptoms by taking steps to limit your exposure to them. It helps to know exactly what you are allergic to so that you can avoid your specific triggers. °SEEK MEDICAL CARE IF:  °· You have a fever. °· You develop a cough that does not stop easily (persistent). °· You have shortness of breath. °· You start wheezing. °· Symptoms interfere with normal daily activities. °Document Released: 02/07/2001 Document Revised: 05/20/2013 Document Reviewed: 01/20/2013 °Exit[employer]® Patient Information ©2015 [employer]. This information is not intended to replace advice given to you by your health care provider. Make sure you discuss any questions you have with your health care provider. ° °Hay Fever °Hay fever is an allergic reaction to particles in the air. It cannot be passed from person to person. It cannot be cured, but it can be controlled. °CAUSES  °Hay fever is caused by something that triggers an allergic reaction (allergens). The following are examples of allergens: °· Ragweed. °· Feathers. °· Animal dander. °· Grass and tree pollens. °· Cigarette smoke. °· House dust. °· Pollution. °SYMPTOMS  °· Sneezing. °· Runny or stuffy nose. °· Tearing eyes. °· Itchy eyes, nose, mouth, throat, skin, or other area. °· Sore throat. °· Headache. °· Decreased sense of smell or taste. °DIAGNOSIS °Your caregiver will perform a physical exam and ask questions about the symptoms you are having. Allergy testing may be done to determine exactly what triggers your hay fever.   °TREATMENT  °· Over-the-counter medicines may help symptoms. These include: °¨ Antihistamines. °¨ Decongestants.   These may help with nasal congestion. °· Your caregiver may prescribe medicines if over-the-counter medicines do not work. °· Some people benefit from allergy shots when other medicines  are not helpful. °HOME CARE INSTRUCTIONS  °· Avoid the allergen that is causing your symptoms, if possible. °· Take all medicine as told by your caregiver. °SEEK MEDICAL CARE IF:  °· You have severe allergy symptoms and your current medicines are not helping. °· Your treatment was working at one time, but you are now experiencing symptoms. °· You have sinus congestion and pressure. °· You develop a fever or headache. °· You have thick nasal discharge. °· You have asthma and have a worsening cough and wheezing. °SEEK IMMEDIATE MEDICAL CARE IF:  °· You have swelling of your tongue or lips. °· You have trouble breathing. °· You feel lightheaded or like you are going to faint. °· You have cold sweats. °· You have a fever. °Document Released: 05/15/2005 Document Revised: 08/07/2011 Document Reviewed: 08/10/2010 °ExitCare® Patient Information ©2015 [employer]. This information is not intended to replace advice given to you by your health care provider. Make sure you discuss any questions you have with your health care provider. ° °

## 2014-10-13 ENCOUNTER — Ambulatory Visit (INDEPENDENT_AMBULATORY_CARE_PROVIDER_SITE_OTHER): Payer: Medicaid Other | Admitting: Endocrinology

## 2014-10-13 ENCOUNTER — Encounter: Payer: Self-pay | Admitting: Endocrinology

## 2014-10-13 VITALS — BP 126/84 | HR 73 | Wt 196.0 lb

## 2014-10-13 DIAGNOSIS — E0520 Thyrotoxicosis with toxic multinodular goiter without thyrotoxic crisis or storm: Secondary | ICD-10-CM | POA: Diagnosis not present

## 2014-10-13 MED ORDER — METHIMAZOLE 10 MG PO TABS: 10.0000 mg | ORAL_TABLET | Freq: Every day | ORAL | Status: DC

## 2014-10-13 NOTE — Patient Instructions (Addendum)
blood tests are requested for you today.  We'll let you know about the results. Please come back for a follow-up appointment in 2 months.  if ever you have fever while taking methimazole, stop it and call us, because of the risk of a rare side-effect.    

## 2014-10-13 NOTE — Progress Notes (Signed)
Subjective:    Patient ID: Monica Chase, female    DOB: 1975/08/19, 39 y.o.   MRN: [id number]  HPI Pt returns for f/u of hyperthyroidism (dx'ed 2008; it was first dx'ed during a pregnancy; she chose rx with tapazole; she has never had thyroid imaging).  pt says she misses the tapazole approx twice a week.  She has palitations in the chest, and assoc lightheadedness.  She says she is not at risk for pregnancy. Past Medical History  Diagnosis Date  . HYPERTHYROIDISM 07/20/2009  . DEPRESSION, MAJOR, RECURRENT 07/26/2006  . ANXIETY 07/26/2006  . ASTHMA, INTERMITTENT 07/26/2006  . HYPERGLYCEMIA 09/28/2009  . CONSTIPATION, CHRONIC 06/22/2010  . Dizziness 11/09/2010  . OBESITY 01/31/2008  . Fibroid     Past Surgical History  Procedure Laterality Date  . Tubal ligation  2008  . Iud removal  10/07    Mirena removed 05/10/2004  . Pilonidal cyst excision    . Vaginal delivery      X 4  . Vaginal hysterectomy N/A 07/07/2013    Procedure: HYSTERECTOMY VAGINAL;  Surgeon: Donnamae Jude, MD;  Location: [facility];  Service: Gynecology;  Laterality: N/A;  . Bilateral salpingectomy Bilateral 07/07/2013    Procedure: BILATERAL SALPINGECTOMY;  Surgeon: Donnamae Jude, MD;  Location: [facility];  Service: Gynecology;  Laterality: Bilateral;    History   Social History  . Marital Status: Divorced    Spouse Name: N/A  . Number of Children: N/A  . Years of Education: N/A   Occupational History  . Not on file.   Social History Main Topics  . Smoking status: Former Smoker    Quit date: 11/08/2005  . Smokeless tobacco: Never Used  . Alcohol Use: No  . Drug Use: No  . Sexual Activity: Not Currently    Birth Control/ Protection: Surgical   Other Topics Concern  . Not on file   Social History Narrative   single    Current Outpatient Prescriptions on File Prior to Visit  Medication Sig Dispense Refill  . albuterol (PROVENTIL HFA;VENTOLIN HFA) 108 (90 BASE) MCG/ACT inhaler Inhale 2 puffs into the lungs  every 6 (six) hours as needed for wheezing. 1 Inhaler 0  . aspirin 325 MG tablet Take 325-650 mg by mouth every 6 (six) hours as needed for mild pain.    . cholecalciferol (VITAMIN D) 1000 UNITS tablet Take 2,000 Units by mouth daily.    . hydrocortisone 2.5 % lotion Apply topically 2 (two) times daily. To the affected area for 5 days. 59 mL 0  . ibuprofen (ADVIL,MOTRIN) 600 MG tablet Take 1 tablet (600 mg total) by mouth every 8 (eight) hours as needed for moderate pain (Take with food). 30 tablet 3  . ipratropium (ATROVENT) 0.06 % nasal spray Place 2 sprays into both nostrils 4 (four) times daily. For nasal congestion 15 mL 0  . montelukast (SINGULAIR) 10 MG tablet Take 1 tablet (10 mg total) by mouth at bedtime. 30 tablet 0  . Multiple Vitamins-Minerals (MULTIVITAMIN) tablet Take 1 tablet by mouth daily. 90 tablet 3  . levocetirizine (XYZAL) 5 MG tablet Take 1 tablet (5 mg total) by mouth every evening. (Patient not taking: Reported on 10/13/2014) 30 tablet 0   No current facility-administered medications on file prior to visit.    Allergies  Allergen Reactions  . Penicillins Other (See Comments)    Reaction unknown    Family History  Problem Relation Age of Onset  . Hypertension Other  BP 126/84 mmHg  Pulse 73  Wt 196 lb (88.905 kg)  SpO2 98%  LMP 06/17/2013  Review of Systems She has lost weight.  Denies fever.     Objective:   Physical Exam VITAL SIGNS:  See vs page.  GENERAL: no distress. Neck: thyroid slightly enlarged on the right, but normal on the left.     Lab Results  Component Value Date   TSH 1.44 10/13/2014      Assessment & Plan:  Hyperthyroidism: well-controlled.  Patient is advised the following: Patient Instructions  blood tests are requested for you today.  We'll let you know about the results.  Please come back for a follow-up appointment in 2 months. if ever you have fever while taking methimazole, stop it and call us, because of the risk  of a rare side-effect.     addendum: Please continue the same tapazole.

## 2014-10-14 LAB — TSH: TSH: 1.44 u[IU]/mL (ref 0.35–4.50)

## 2014-10-14 LAB — T4, FREE: Free T4: 0.62 ng/dL (ref 0.60–1.60)

## 2014-10-14 MED ORDER — METHIMAZOLE 10 MG PO TABS: 10.0000 mg | ORAL_TABLET | Freq: Every day | ORAL | Status: DC

## 2014-10-20 ENCOUNTER — Telehealth: Payer: Self-pay | Admitting: Endocrinology

## 2014-10-20 ENCOUNTER — Ambulatory Visit: Payer: Medicaid Other | Admitting: Endocrinology

## 2014-10-20 NOTE — Telephone Encounter (Signed)
See note below and please advise, Thanks! 

## 2014-10-20 NOTE — Telephone Encounter (Signed)
As your blood tests were normal last week, you don't have to worry about the symptoms coming from the thyroid

## 2014-10-20 NOTE — Telephone Encounter (Signed)
Patient called and would like to know if her medication is causing her pain  Monica Chase complaint is pain in both hands  Could this be a reaction to her medication?  Medication: Methimazole   Please advise    Thank you

## 2014-10-21 NOTE — Telephone Encounter (Signed)
I contacted the patient and advised of note below.

## 2014-11-06 ENCOUNTER — Encounter (HOSPITAL_COMMUNITY): Payer: Self-pay | Admitting: *Deleted

## 2014-11-06 ENCOUNTER — Emergency Department (HOSPITAL_COMMUNITY)
Admission: EM | Admit: 2014-11-06 | Discharge: 2014-11-06 | Disposition: A | Discharge status: Home / Self Care | Payer: Medicaid Other | Source: Home / Self Care | Attending: Family Medicine | Admitting: Family Medicine

## 2014-11-06 DIAGNOSIS — J0100 Acute maxillary sinusitis, unspecified: Secondary | ICD-10-CM

## 2014-11-06 MED ORDER — RANITIDINE HCL 150 MG PO CAPS: 150.0000 mg | ORAL_CAPSULE | Freq: Two times a day (BID) | ORAL | Status: DC

## 2014-11-06 MED ORDER — FLUTICASONE PROPIONATE 50 MCG/ACT NA SUSP: 2.0000 | Freq: Every day | NASAL | Status: DC

## 2014-11-06 MED ORDER — PREDNISONE 10 MG PO TABS: 30.0000 mg | ORAL_TABLET | Freq: Every day | ORAL | Status: DC

## 2014-11-06 NOTE — ED Notes (Signed)
Pt is here with complaints of sinus issues, facial pain, headaches.

## 2014-11-06 NOTE — Discharge Instructions (Signed)
Thank you for coming in today. °Call or go to the emergency room if you get worse, have trouble breathing, have chest pains, or palpitations.  ° °Sinusitis °Sinusitis is redness, soreness, and inflammation of the paranasal sinuses. Paranasal sinuses are air pockets within the bones of your face (beneath the eyes, the middle of the forehead, or above the eyes). In healthy paranasal sinuses, mucus is able to drain out, and air is able to circulate through them by way of your nose. However, when your paranasal sinuses are inflamed, mucus and air can become trapped. This can allow bacteria and other germs to grow and cause infection. °Sinusitis can develop quickly and last only a short time (acute) or continue over a long period (chronic). Sinusitis that lasts for more than 12 weeks is considered chronic.  °CAUSES  °Causes of sinusitis include: °· Allergies. °· Structural abnormalities, such as displacement of the cartilage that separates your nostrils (deviated septum), which can decrease the air flow through your nose and sinuses and affect sinus drainage. °· Functional abnormalities, such as when the small hairs (cilia) that line your sinuses and help remove mucus do not work properly or are not present. °SIGNS AND SYMPTOMS  °Symptoms of acute and chronic sinusitis are the same. The primary symptoms are pain and pressure around the affected sinuses. Other symptoms include: °· Upper toothache. °· Earache. °· Headache. °· Bad breath. °· Decreased sense of smell and taste. °· A cough, which worsens when you are lying flat. °· Fatigue. °· Fever. °· Thick drainage from your nose, which often is green and may contain pus (purulent). °· Swelling and warmth over the affected sinuses. °DIAGNOSIS  °Your health care provider will perform a physical exam. During the exam, your health care provider may: °· Look in your nose for signs of abnormal growths in your nostrils (nasal polyps). °· Tap over the affected sinus to check for  signs of infection. °· View the inside of your sinuses (endoscopy) using an imaging device that has a light attached (endoscope). °If your health care provider suspects that you have chronic sinusitis, one or more of the following tests may be recommended: °· Allergy tests. °· Nasal culture. A sample of mucus is taken from your nose, sent to a lab, and screened for bacteria. °· Nasal cytology. A sample of mucus is taken from your nose and examined by your health care provider to determine if your sinusitis is related to an allergy. °TREATMENT  °Most cases of acute sinusitis are related to a viral infection and will resolve on their own within 10 days. Sometimes medicines are prescribed to help relieve symptoms (pain medicine, decongestants, nasal steroid sprays, or saline sprays).  °However, for sinusitis related to a bacterial infection, your health care provider will prescribe antibiotic medicines. These are medicines that will help kill the bacteria causing the infection.  °Rarely, sinusitis is caused by a fungal infection. In theses cases, your health care provider will prescribe antifungal medicine. °For some cases of chronic sinusitis, surgery is needed. Generally, these are cases in which sinusitis recurs more than 3 times per year, despite other treatments. °HOME CARE INSTRUCTIONS  °· Drink plenty of water. Water helps thin the mucus so your sinuses can drain more easily. °· Use a humidifier. °· Inhale steam 3 to 4 times a day (for example, sit in the bathroom with the shower running). °· Apply a warm, moist washcloth to your face 3 to 4 times a day, or as directed by your   health care provider.  Use saline nasal sprays to help moisten and clean your sinuses.  Take medicines only as directed by your health care provider.  If you were prescribed either an antibiotic or antifungal medicine, finish it all even if you start to feel better. SEEK IMMEDIATE MEDICAL CARE IF:  You have increasing pain or  severe headaches.  You have nausea, vomiting, or drowsiness.  You have swelling around your face.  You have vision problems.  You have a stiff neck.  You have difficulty breathing. MAKE SURE YOU:   Understand these instructions.  Will watch your condition.  Will get help right away if you are not doing well or get worse. Document Released: 05/15/2005 Document Revised: 09/29/2013 Document Reviewed: 05/30/2011 [facility] Patient Information 2015 [place], [place]. This information is not intended to replace advice given to you by your health care provider. Make sure you discuss any questions you have with your health care provider.   Gastroesophageal Reflux Disease, Adult Gastroesophageal reflux disease (GERD) happens when acid from your stomach flows up into the esophagus. When acid comes in contact with the esophagus, the acid causes soreness (inflammation) in the esophagus. Over time, GERD may create small holes (ulcers) in the lining of the esophagus. CAUSES   Increased body weight. This puts pressure on the stomach, making acid rise from the stomach into the esophagus.  Smoking. This increases acid production in the stomach.  Drinking alcohol. This causes decreased pressure in the lower esophageal sphincter (valve or ring of muscle between the esophagus and stomach), allowing acid from the stomach into the esophagus.  Late evening meals and a full stomach. This increases pressure and acid production in the stomach.  A malformed lower esophageal sphincter. Sometimes, no cause is found. SYMPTOMS   Burning pain in the lower part of the mid-chest behind the breastbone and in the mid-stomach area. This may occur twice a week or more often.  Trouble swallowing.  Sore throat.  Dry cough.  Asthma-like symptoms including chest tightness, shortness of breath, or wheezing. DIAGNOSIS  Your caregiver may be able to diagnose GERD based on your symptoms. In some cases, X-rays and  other tests may be done to check for complications or to check the condition of your stomach and esophagus. TREATMENT  Your caregiver may recommend over-the-counter or prescription medicines to help decrease acid production. Ask your caregiver before starting or adding any new medicines.  HOME CARE INSTRUCTIONS   Change the factors that you can control. Ask your caregiver for guidance concerning weight loss, quitting smoking, and alcohol consumption.  Avoid foods and drinks that make your symptoms worse, such as:  Caffeine or alcoholic drinks.  Chocolate.  Peppermint or mint flavorings.  Garlic and onions.  Spicy foods.  Citrus fruits, such as oranges, lemons, or limes.  Tomato-based foods such as sauce, chili, salsa, and pizza.  Fried and fatty foods.  Avoid lying down for the 3 hours prior to your bedtime or prior to taking a nap.  Eat small, frequent meals instead of large meals.  Wear loose-fitting clothing. Do not wear anything tight around your waist that causes pressure on your stomach.  Raise the head of your bed 6 to 8 inches with wood blocks to help you sleep. Extra pillows will not help.  Only take over-the-counter or prescription medicines for pain, discomfort, or fever as directed by your caregiver.  Do not take aspirin, ibuprofen, or other nonsteroidal anti-inflammatory drugs (NSAIDs). SEEK IMMEDIATE MEDICAL CARE IF:  You have pain in your arms, neck, jaw, teeth, or back.  Your pain increases or changes in intensity or duration.  You develop nausea, vomiting, or sweating (diaphoresis).  You develop shortness of breath, or you faint.  Your vomit is green, yellow, black, or looks like coffee grounds or blood.  Your stool is red, bloody, or black. These symptoms could be signs of other problems, such as heart disease, gastric bleeding, or esophageal bleeding. MAKE SURE YOU:   Understand these instructions.  Will watch your condition.  Will get help  right away if you are not doing well or get worse. Document Released: 02/22/2005 Document Revised: 08/07/2011 Document Reviewed: 12/02/2010 [facility] Patient Information 2015 [place], [place]. This information is not intended to replace advice given to you by your health care provider. Make sure you discuss any questions you have with your health care provider.

## 2014-11-06 NOTE — ED Provider Notes (Signed)
Monica Chase is a 39 y.o. female who presents to Urgent Care today for sinus pressure and cough present for one month. Patient was seen on May 18 and treated with Atrovent nasal spray and montelukast. This helps some. She continues to note bilateral facial pressure and cough. She used some of her daughter's ranitidine syrup which helped to control her cough. She denies any significant reflux symptoms however. No fevers or chills.   Past Medical History  Diagnosis Date  . HYPERTHYROIDISM 07/20/2009  . DEPRESSION, MAJOR, RECURRENT 07/26/2006  . ANXIETY 07/26/2006  . ASTHMA, INTERMITTENT 07/26/2006  . HYPERGLYCEMIA 09/28/2009  . CONSTIPATION, CHRONIC 06/22/2010  . Dizziness 11/09/2010  . OBESITY 01/31/2008  . Fibroid    Past Surgical History  Procedure Laterality Date  . Tubal ligation  2008  . Iud removal  10/07    Mirena removed 05/10/2004  . Pilonidal cyst excision    . Vaginal delivery      X 4  . Vaginal hysterectomy N/A 07/07/2013    Procedure: HYSTERECTOMY VAGINAL;  Surgeon: Donnamae Jude, MD;  Location: [facility];  Service: Gynecology;  Laterality: N/A;  . Bilateral salpingectomy Bilateral 07/07/2013    Procedure: BILATERAL SALPINGECTOMY;  Surgeon: Donnamae Jude, MD;  Location: [facility];  Service: Gynecology;  Laterality: Bilateral;   History  Substance Use Topics  . Smoking status: Former Smoker    Quit date: 11/08/2005  . Smokeless tobacco: Never Used  . Alcohol Use: No   ROS as above Medications: No current facility-administered medications for this encounter.   Current Outpatient Prescriptions  Medication Sig Dispense Refill  . albuterol (PROVENTIL HFA;VENTOLIN HFA) 108 (90 BASE) MCG/ACT inhaler Inhale 2 puffs into the lungs every 6 (six) hours as needed for wheezing. 1 Inhaler 0  . aspirin 325 MG tablet Take 325-650 mg by mouth every 6 (six) hours as needed for mild pain.    . cholecalciferol (VITAMIN D) 1000 UNITS tablet Take 2,000 Units by mouth daily.    . fluticasone  (FLONASE) 50 MCG/ACT nasal spray Place 2 sprays into both nostrils daily. 16 g 2  . hydrocortisone 2.5 % lotion Apply topically 2 (two) times daily. To the affected area for 5 days. 59 mL 0  . ibuprofen (ADVIL,MOTRIN) 600 MG tablet Take 1 tablet (600 mg total) by mouth every 8 (eight) hours as needed for moderate pain (Take with food). 30 tablet 3  . ipratropium (ATROVENT) 0.06 % nasal spray Place 2 sprays into both nostrils 4 (four) times daily. For nasal congestion 15 mL 0  . methimazole (TAPAZOLE) 10 MG tablet Take 1 tablet (10 mg total) by mouth daily. 30 tablet 5  . montelukast (SINGULAIR) 10 MG tablet Take 1 tablet (10 mg total) by mouth at bedtime. 30 tablet 0  . Multiple Vitamins-Minerals (MULTIVITAMIN) tablet Take 1 tablet by mouth daily. 90 tablet 3  . predniSONE (DELTASONE) 10 MG tablet Take 3 tablets (30 mg total) by mouth daily. 15 tablet 0  . ranitidine (ZANTAC) 150 MG capsule Take 1 capsule (150 mg total) by mouth 2 (two) times daily. 60 capsule 1  . [DISCONTINUED] levocetirizine (XYZAL) 5 MG tablet Take 1 tablet (5 mg total) by mouth every evening. (Patient not taking: Reported on 10/13/2014) 30 tablet 0   Allergies  Allergen Reactions  . Penicillins Other (See Comments)    Reaction unknown     Exam:  BP 119/76 mmHg  Pulse 66  Temp(Src) 98.8 F (37.1 C) (Oral)  Resp 12  SpO2  99%  LMP 06/17/2013 Gen: Well NAD HEENT: EOMI,  MMM nontender maxillary and frontal sinuses. Posterior pharynx with cobblestoning. Normal nasal turbinates. Lungs: Normal work of breathing. CTABL Heart: RRR no MRG Abd: NABS, Soft. Nondistended, Nontender Exts: Brisk capillary refill, warm and well perfused.   No results found for this or any previous visit (from the past 24 hour(s)). No results found.  Assessment and Plan: 39 y.o. female with persistent sinus pressure and cough. Treat with prednisone, ranitidine as that seemed to work previously and Flonase nasal spray. Follow-up with ear nose  and throat doctors if not better.  Discussed warning signs or symptoms. Please see discharge instructions. Patient expresses understanding.     Gregor Hams, MD 11/06/14 [phone]

## 2014-12-14 ENCOUNTER — Ambulatory Visit: Payer: Medicaid Other | Admitting: Endocrinology

## 2014-12-15 ENCOUNTER — Telehealth: Payer: Self-pay | Admitting: Endocrinology

## 2014-12-15 NOTE — Telephone Encounter (Signed)
Pt rescheduled appt for Thursday her symptoms seem to be worsening

## 2014-12-17 ENCOUNTER — Encounter: Payer: Self-pay | Admitting: Endocrinology

## 2014-12-17 ENCOUNTER — Ambulatory Visit (INDEPENDENT_AMBULATORY_CARE_PROVIDER_SITE_OTHER): Payer: Medicaid Other | Admitting: Endocrinology

## 2014-12-17 VITALS — BP 122/82 | HR 73 | Temp 98.2°F | Resp 16 | Ht 71.0 in | Wt 198.0 lb

## 2014-12-17 DIAGNOSIS — E0520 Thyrotoxicosis with toxic multinodular goiter without thyrotoxic crisis or storm: Secondary | ICD-10-CM

## 2014-12-17 LAB — T4, FREE: Free T4: 0.67 ng/dL (ref 0.60–1.60)

## 2014-12-17 LAB — TSH: TSH: 5.27 u[IU]/mL — ABNORMAL HIGH (ref 0.35–4.50)

## 2014-12-17 MED ORDER — METHIMAZOLE 5 MG PO TABS: 5.0000 mg | ORAL_TABLET | Freq: Three times a day (TID) | ORAL | Status: DC

## 2014-12-17 NOTE — Patient Instructions (Addendum)
blood tests are requested for you today.  We'll let you know about the results.  If the blood tess are normal, please see your PCP about your symptoms.  Please come back for a follow-up appointment in 3 months.  if ever you have fever while taking methimazole, stop it and call us, because of the risk of a rare side-effect.

## 2014-12-17 NOTE — Progress Notes (Signed)
Subjective:    Patient ID: Monica Chase, female    DOB: 1975-06-06, 39 y.o.   MRN: [id number]  HPI Pt returns for f/u of hyperthyroidism (dx'ed 2008; it was first dx'ed during a pregnancy; she chose rx with tapazole; she has never had thyroid imaging).  pt says she never misses the tapazole.  Pt reports ongoing sxs of anxiety, fatigue, excessive diaphoresis, palpitations, lightheadedness, and muscle weakness.   Past Medical History  Diagnosis Date  . HYPERTHYROIDISM 07/20/2009  . DEPRESSION, MAJOR, RECURRENT 07/26/2006  . ANXIETY 07/26/2006  . ASTHMA, INTERMITTENT 07/26/2006  . HYPERGLYCEMIA 09/28/2009  . CONSTIPATION, CHRONIC 06/22/2010  . Dizziness 11/09/2010  . OBESITY 01/31/2008  . Fibroid     Past Surgical History  Procedure Laterality Date  . Tubal ligation  2008  . Iud removal  10/07    Mirena removed 05/10/2004  . Pilonidal cyst excision    . Vaginal delivery      X 4  . Vaginal hysterectomy N/A 07/07/2013    Procedure: HYSTERECTOMY VAGINAL;  Surgeon: Donnamae Jude, MD;  Location: [facility];  Service: Gynecology;  Laterality: N/A;  . Bilateral salpingectomy Bilateral 07/07/2013    Procedure: BILATERAL SALPINGECTOMY;  Surgeon: Donnamae Jude, MD;  Location: [facility];  Service: Gynecology;  Laterality: Bilateral;    History   Social History  . Marital Status: Divorced    Spouse Name: N/A  . Number of Children: N/A  . Years of Education: N/A   Occupational History  . Not on file.   Social History Main Topics  . Smoking status: Former Smoker    Quit date: 11/08/2005  . Smokeless tobacco: Never Used  . Alcohol Use: No  . Drug Use: No  . Sexual Activity: Not Currently    Birth Control/ Protection: Surgical   Other Topics Concern  . Not on file   Social History Narrative   single    Current Outpatient Prescriptions on File Prior to Visit  Medication Sig Dispense Refill  . albuterol (PROVENTIL HFA;VENTOLIN HFA) 108 (90 BASE) MCG/ACT inhaler Inhale 2 puffs into the  lungs every 6 (six) hours as needed for wheezing. 1 Inhaler 0  . aspirin 325 MG tablet Take 325-650 mg by mouth every 6 (six) hours as needed for mild pain.    . cholecalciferol (VITAMIN D) 1000 UNITS tablet Take 2,000 Units by mouth daily.    . fluticasone (FLONASE) 50 MCG/ACT nasal spray Place 2 sprays into both nostrils daily. 16 g 2  . hydrocortisone 2.5 % lotion Apply topically 2 (two) times daily. To the affected area for 5 days. 59 mL 0  . ibuprofen (ADVIL,MOTRIN) 600 MG tablet Take 1 tablet (600 mg total) by mouth every 8 (eight) hours as needed for moderate pain (Take with food). 30 tablet 3  . ipratropium (ATROVENT) 0.06 % nasal spray Place 2 sprays into both nostrils 4 (four) times daily. For nasal congestion 15 mL 0  . montelukast (SINGULAIR) 10 MG tablet Take 1 tablet (10 mg total) by mouth at bedtime. 30 tablet 0  . Multiple Vitamins-Minerals (MULTIVITAMIN) tablet Take 1 tablet by mouth daily. 90 tablet 3  . predniSONE (DELTASONE) 10 MG tablet Take 3 tablets (30 mg total) by mouth daily. 15 tablet 0  . ranitidine (ZANTAC) 150 MG capsule Take 1 capsule (150 mg total) by mouth 2 (two) times daily. 60 capsule 1  . [DISCONTINUED] levocetirizine (XYZAL) 5 MG tablet Take 1 tablet (5 mg total) by mouth every evening. (Patient  not taking: Reported on 10/13/2014) 30 tablet 0   No current facility-administered medications on file prior to visit.    Allergies  Allergen Reactions  . Penicillins Other (See Comments)    Reaction unknown    Family History  Problem Relation Age of Onset  . Hypertension Other     BP 122/82 mmHg  Pulse 73  Temp(Src) 98.2 F (36.8 C) (Oral)  Resp 16  Ht 5\' 11"  (1.803 m)  Wt 198 lb (89.812 kg)  BMI 27.63 kg/m2  SpO2 97%  LMP 06/17/2013  Review of Systems Denies fever.      Objective:   Physical Exam VITAL SIGNS:  See vs page.  GENERAL: no distress. Neck: thyroid slightly enlarged on the right, but normal on the left.   Skin: not diaphoretic.    Neuro: no tremor  Lab Results  Component Value Date   TSH 5.27* 12/17/2014      Assessment & Plan:  Hyperthyroidism: slightly overcontrolled.    Patient is advised the following: Patient Instructions  blood tests are requested for you today.  We'll let you know about the results.  If the blood tess are normal, please see your PCP about your symptoms.  Please come back for a follow-up appointment in 3 months.  if ever you have fever while taking methimazole, stop it and call us, because of the risk of a rare side-effect.    addendum: i have sent a prescription to your pharmacy, to reduce methimazole.

## 2014-12-21 ENCOUNTER — Other Ambulatory Visit: Payer: Self-pay

## 2014-12-21 ENCOUNTER — Telehealth: Payer: Self-pay

## 2014-12-21 MED ORDER — METHIMAZOLE 5 MG PO TABS: 5.0000 mg | ORAL_TABLET | Freq: Three times a day (TID) | ORAL | Status: DC

## 2014-12-21 NOTE — Telephone Encounter (Signed)
She received the lab results from my chart, her thyroid is low per Dr. Cordelia Pen note but it appears to be high in the orders, she needs clarification please advise

## 2014-12-21 NOTE — Telephone Encounter (Signed)
Pt wanted to verify her Methimazole dosage. Most recent lab results states we are reducing her dosage to 5 mg tid. Pt has been taking 10mg  1 time per day.  Please advise of dosage. Thanks!

## 2014-12-22 ENCOUNTER — Other Ambulatory Visit: Payer: Self-pay

## 2014-12-22 NOTE — Telephone Encounter (Signed)
Spoke with Dr. Loanne Drilling verbally over the phone. He verified the pt is to take 5 mg 1 time per day. Pt has been notified and voiced understanding.

## 2015-01-06 ENCOUNTER — Ambulatory Visit: Payer: Medicaid Other | Admitting: Endocrinology

## 2015-02-05 ENCOUNTER — Observation Stay (HOSPITAL_COMMUNITY): Payer: Medicaid Other

## 2015-02-05 ENCOUNTER — Encounter (HOSPITAL_COMMUNITY): Payer: Self-pay | Admitting: Emergency Medicine

## 2015-02-05 ENCOUNTER — Observation Stay (HOSPITAL_COMMUNITY)
Admission: EM | Admit: 2015-02-05 | Discharge: 2015-02-09 | Disposition: A | Discharge status: Home / Self Care | Payer: Medicaid Other | Attending: Internal Medicine | Admitting: Internal Medicine

## 2015-02-05 ENCOUNTER — Telehealth: Payer: Self-pay

## 2015-02-05 DIAGNOSIS — Z87891 Personal history of nicotine dependence: Secondary | ICD-10-CM | POA: Insufficient documentation

## 2015-02-05 DIAGNOSIS — R55 Syncope and collapse: Secondary | ICD-10-CM

## 2015-02-05 DIAGNOSIS — E669 Obesity, unspecified: Secondary | ICD-10-CM | POA: Diagnosis not present

## 2015-02-05 DIAGNOSIS — Z79899 Other long term (current) drug therapy: Secondary | ICD-10-CM | POA: Diagnosis not present

## 2015-02-05 DIAGNOSIS — Z7952 Long term (current) use of systemic steroids: Secondary | ICD-10-CM | POA: Insufficient documentation

## 2015-02-05 DIAGNOSIS — R42 Dizziness and giddiness: Principal | ICD-10-CM

## 2015-02-05 DIAGNOSIS — R519 Headache, unspecified: Secondary | ICD-10-CM | POA: Diagnosis present

## 2015-02-05 DIAGNOSIS — Z7982 Long term (current) use of aspirin: Secondary | ICD-10-CM | POA: Insufficient documentation

## 2015-02-05 DIAGNOSIS — K5909 Other constipation: Secondary | ICD-10-CM | POA: Diagnosis not present

## 2015-02-05 DIAGNOSIS — J45909 Unspecified asthma, uncomplicated: Secondary | ICD-10-CM | POA: Insufficient documentation

## 2015-02-05 DIAGNOSIS — E0590 Thyrotoxicosis, unspecified without thyrotoxic crisis or storm: Secondary | ICD-10-CM | POA: Diagnosis not present

## 2015-02-05 DIAGNOSIS — Z86018 Personal history of other benign neoplasm: Secondary | ICD-10-CM | POA: Insufficient documentation

## 2015-02-05 DIAGNOSIS — Z880 Allergy status to penicillin: Secondary | ICD-10-CM | POA: Insufficient documentation

## 2015-02-05 DIAGNOSIS — R51 Headache: Secondary | ICD-10-CM

## 2015-02-05 LAB — BASIC METABOLIC PANEL
ANION GAP: 9 (ref 5–15)
BUN: 10 mg/dL (ref 6–20)
CO2: 27 mmol/L (ref 22–32)
Calcium: 9.2 mg/dL (ref 8.9–10.3)
Chloride: 100 mmol/L — ABNORMAL LOW (ref 101–111)
Creatinine, Ser: 0.75 mg/dL (ref 0.44–1.00)
GFR calc Af Amer: >60 mL/min (ref >60)
Glucose, Bld: 81 mg/dL (ref 65–99)
POTASSIUM: 3.7 mmol/L (ref 3.5–5.1)
SODIUM: 136 mmol/L (ref 135–145)

## 2015-02-05 LAB — URINALYSIS, ROUTINE W REFLEX MICROSCOPIC
Bilirubin Urine: NEGATIVE
Glucose, UA: NEGATIVE mg/dL
Hgb urine dipstick: NEGATIVE
Ketones, ur: NEGATIVE mg/dL
LEUKOCYTES UA: NEGATIVE
NITRITE: NEGATIVE
PH: 6.5 (ref 5.0–8.0)
Protein, ur: NEGATIVE mg/dL
SPECIFIC GRAVITY, URINE: 1.019 (ref 1.005–1.030)
UROBILINOGEN UA: 0.2 mg/dL (ref 0.0–1.0)

## 2015-02-05 LAB — T4, FREE: FREE T4: 0.88 ng/dL (ref 0.61–1.12)

## 2015-02-05 LAB — CBC
HEMATOCRIT: 42.5 % (ref 36.0–46.0)
HEMOGLOBIN: 14.7 g/dL (ref 12.0–15.0)
MCH: 30.9 pg (ref 26.0–34.0)
MCHC: 34.6 g/dL (ref 30.0–36.0)
MCV: 89.5 fL (ref 78.0–100.0)
Platelets: 342 10*3/uL (ref 150–400)
RBC: 4.75 MIL/uL (ref 3.87–5.11)
RDW: 13 % (ref 11.5–15.5)
WBC: 5.7 10*3/uL (ref 4.0–10.5)

## 2015-02-05 LAB — TSH: TSH: 2.15 u[IU]/mL (ref 0.350–4.500)

## 2015-02-05 LAB — CBG MONITORING, ED: GLUCOSE-CAPILLARY: 71 mg/dL (ref 65–99)

## 2015-02-05 MED ORDER — ENSURE ENLIVE PO LIQD
237.0000 mL | Freq: Two times a day (BID) | ORAL | Status: DC
  Administered 2015-02-06: 237 mL via ORAL

## 2015-02-05 MED ORDER — ACETAMINOPHEN 325 MG PO TABS
650.0000 mg | ORAL_TABLET | Freq: Four times a day (QID) | ORAL | Status: DC | PRN
  Administered 2015-02-06 (×2): 650 mg via ORAL
  Filled 2015-02-05 (×2): qty 2

## 2015-02-05 MED ORDER — GADOBENATE DIMEGLUMINE 529 MG/ML IV SOLN
20.0000 mL | Freq: Once | INTRAVENOUS | Status: AC | PRN
  Administered 2015-02-05: 20 mL via INTRAVENOUS

## 2015-02-05 MED ORDER — ENOXAPARIN SODIUM 40 MG/0.4ML ~~LOC~~ SOLN
40.0000 mg | SUBCUTANEOUS | Status: DC
Start: 2015-02-05 — End: 2015-02-06

## 2015-02-05 MED ORDER — SODIUM CHLORIDE 0.9 % IV SOLN
INTRAVENOUS | Status: DC
  Administered 2015-02-05 – 2015-02-06 (×2): via INTRAVENOUS

## 2015-02-05 MED ORDER — MECLIZINE HCL 32 MG PO TABS: 32.0000 mg | ORAL_TABLET | Freq: Three times a day (TID) | ORAL | Status: DC | PRN

## 2015-02-05 MED ORDER — LORAZEPAM 2 MG/ML IJ SOLN
0.5000 mg | Freq: Once | INTRAMUSCULAR | Status: AC
  Administered 2015-02-05: 0.5 mg via INTRAVENOUS
  Filled 2015-02-05: qty 1

## 2015-02-05 MED ORDER — ALBUTEROL SULFATE (2.5 MG/3ML) 0.083% IN NEBU: 2.5000 mg | INHALATION_SOLUTION | RESPIRATORY_TRACT | Status: DC | PRN

## 2015-02-05 MED ORDER — SODIUM CHLORIDE 0.9 % IV BOLUS (SEPSIS): 1000.0000 mL | Freq: Once | INTRAVENOUS | Status: DC

## 2015-02-05 MED ORDER — SODIUM CHLORIDE 0.9 % IJ SOLN
3.0000 mL | Freq: Two times a day (BID) | INTRAMUSCULAR | Status: DC
  Administered 2015-02-06 – 2015-02-09 (×5): 3 mL via INTRAVENOUS

## 2015-02-05 MED ORDER — ACETAMINOPHEN 650 MG RE SUPP: 650.0000 mg | Freq: Four times a day (QID) | RECTAL | Status: DC | PRN

## 2015-02-05 MED ORDER — ONDANSETRON HCL 4 MG/2ML IJ SOLN: 4.0000 mg | Freq: Four times a day (QID) | INTRAMUSCULAR | Status: DC | PRN

## 2015-02-05 MED ORDER — SODIUM CHLORIDE 0.9 % IV BOLUS (SEPSIS)
1000.0000 mL | Freq: Once | INTRAVENOUS | Status: AC
  Administered 2015-02-05: 1000 mL via INTRAVENOUS

## 2015-02-05 MED ORDER — ONDANSETRON HCL 4 MG PO TABS: 4.0000 mg | ORAL_TABLET | Freq: Four times a day (QID) | ORAL | Status: DC | PRN

## 2015-02-05 NOTE — ED Notes (Signed)
Patient states that she was at work today and became dizzy and almost passed out.  She also C/O her hands shaking and diaphoresis today.  States that she has felt like this for several days.  She believes that it is her thyroid causing this.

## 2015-02-05 NOTE — ED Notes (Signed)
Pt arrived back from MRI- Pt to be transported upstairs to [place] by [place], EMT. 6E secretary made aware.

## 2015-02-05 NOTE — Telephone Encounter (Signed)
As labs are good, this is not the cause of sxs.  Please see PCP for sxs.

## 2015-02-05 NOTE — Telephone Encounter (Signed)
Pt advised of note below and voiced understanding.  

## 2015-02-05 NOTE — ED Notes (Signed)
patient up ambulatory with stand by assistance to the bathroom; patient asked to provide an urine specimen if she could

## 2015-02-05 NOTE — ED Provider Notes (Addendum)
CSN: [id number]     Arrival date & time 02/05/15  6314 History   First MD Initiated Contact with Patient 02/05/15 1012     Chief Complaint  Patient presents with  . Dizziness    HPI  [name] is a 39 year old lady with history of hyperthyroidism, depression, and anxiety presenting to the emergency department with dizziness and lightheadedness while she was walking earlier this afternoon. She says she's been having these spells for the past 2 months since her endocrinologist re-started her methimazole and this episode was no different. She decided to come into the emergency department because she was "fed up" with living with these spells and wants to stop taking her methimazole, as she says she feels much better when she's not taking it. She also complains of sinus pressure for the past 2 weeks but denies fever, cough, eye pain, or vision changes. Furthermore, she denied any chest pain, shortness of breath, or diaphoresis during her episode.  Past Medical History  Diagnosis Date  . HYPERTHYROIDISM 07/20/2009  . DEPRESSION, MAJOR, RECURRENT 07/26/2006  . ANXIETY 07/26/2006  . ASTHMA, INTERMITTENT 07/26/2006  . HYPERGLYCEMIA 09/28/2009  . CONSTIPATION, CHRONIC 06/22/2010  . Dizziness 11/09/2010  . OBESITY 01/31/2008  . Fibroid    Past Surgical History  Procedure Laterality Date  . Tubal ligation  2008  . Iud removal  10/07    Mirena removed 05/10/2004  . Pilonidal cyst excision    . Vaginal delivery      X 4  . Vaginal hysterectomy N/A 07/07/2013    Procedure: HYSTERECTOMY VAGINAL;  Surgeon: Donnamae Jude, MD;  Location: [facility];  Service: Gynecology;  Laterality: N/A;  . Bilateral salpingectomy Bilateral 07/07/2013    Procedure: BILATERAL SALPINGECTOMY;  Surgeon: Donnamae Jude, MD;  Location: [facility];  Service: Gynecology;  Laterality: Bilateral;   Family History  Problem Relation Age of Onset  . Hypertension Other    Social History  Substance Use Topics  . Smoking status: Former Smoker    Quit date: 11/08/2005  . Smokeless tobacco: Never Used  . Alcohol Use: No   OB History    Gravida Para Term Preterm AB TAB SAB Ectopic Multiple Living   4 4 4       4      Review of Systems  Constitutional: Negative for fever, chills, diaphoresis and fatigue.  HENT: Positive for congestion and sinus pressure. Negative for ear pain, sore throat and tinnitus.   Eyes: Negative for photophobia, pain and visual disturbance.  Respiratory: Negative for cough, chest tightness, shortness of breath and wheezing.   Cardiovascular: Negative for chest pain and palpitations.  Gastrointestinal: Negative for nausea, vomiting and abdominal pain.  Endocrine: Negative for cold intolerance and heat intolerance.  Genitourinary: Negative for dysuria.  Musculoskeletal: Negative for back pain and arthralgias.  Skin: Negative for rash.  Neurological: Positive for dizziness and light-headedness. Negative for syncope, weakness and headaches.  Psychiatric/Behavioral: The patient is nervous/anxious.     Allergies  Penicillins  Home Medications   Prior to Admission medications   Medication Sig Start Date End Date Taking? Authorizing Provider  albuterol (PROVENTIL HFA;VENTOLIN HFA) 108 (90 BASE) MCG/ACT inhaler Inhale 2 puffs into the lungs every 6 (six) hours as needed for wheezing. 06/08/14   Donnamae Jude, MD  aspirin 325 MG tablet Take 325-650 mg by mouth every 6 (six) hours as needed for mild pain.    Historical Provider, MD  cholecalciferol (VITAMIN D) 1000 UNITS tablet Take  2,000 Units by mouth daily.    Historical Provider, MD  fluticasone (FLONASE) 50 MCG/ACT nasal spray Place 2 sprays into both nostrils daily. 11/06/14   Gregor Hams, MD  hydrocortisone 2.5 % lotion Apply topically 2 (two) times daily. To the affected area for 5 days. 09/05/14   Waynetta Pean, PA-C  ibuprofen (ADVIL,MOTRIN) 600 MG tablet Take 1 tablet (600 mg total) by mouth every 8 (eight) hours as needed for moderate pain (Take with  food). 06/29/13   Donnamae Jude, MD  ipratropium (ATROVENT) 0.06 % nasal spray Place 2 sprays into both nostrils 4 (four) times daily. For nasal congestion 10/10/14   Lutricia Feil, PA  methimazole (TAPAZOLE) 5 MG tablet Take 1 tablet (5 mg total) by mouth 3 (three) times daily. 12/21/14   Renato Shin, MD  montelukast (SINGULAIR) 10 MG tablet Take 1 tablet (10 mg total) by mouth at bedtime. 10/10/14   Lutricia Feil, PA  Multiple Vitamins-Minerals (MULTIVITAMIN) tablet Take 1 tablet by mouth daily. 06/08/14   Donnamae Jude, MD  predniSONE (DELTASONE) 10 MG tablet Take 3 tablets (30 mg total) by mouth daily. 11/06/14   Gregor Hams, MD  ranitidine (ZANTAC) 150 MG capsule Take 1 capsule (150 mg total) by mouth 2 (two) times daily. 11/06/14   Gregor Hams, MD   BP 121/68 mmHg  Pulse 69  Temp(Src) 97.9 F (36.6 C)  Resp 17  Ht 5\' 11"  (1.803 m)  Wt 197 lb (89.359 kg)  BMI 27.49 kg/m2  SpO2 99%  LMP 06/17/2013   Physical Exam  Constitutional: She appears well-developed and well-nourished.  HENT:  Head: Normocephalic and atraumatic.  Eyes: Conjunctivae and EOM are normal.  Neck: Normal range of motion. Neck supple.  Cardiovascular: Normal rate, regular rhythm, normal heart sounds and intact distal pulses.   No murmur heard. Pulmonary/Chest: Effort normal. She has no wheezes.  Abdominal: Soft. Bowel sounds are normal. She exhibits no distension. There is no tenderness.  Musculoskeletal: Normal range of motion.  Neurological: She is alert. No cranial nerve deficit.  Skin: Skin is warm and dry. No rash noted. She is not diaphoretic.  Psychiatric: She has a normal mood and affect. Her behavior is normal.   ED Course  Procedures (including critical care time)  Labs Review Labs Reviewed  BASIC METABOLIC PANEL - Abnormal; Notable for the following:    Chloride 100 (*)    All other components within normal limits  URINALYSIS, ROUTINE W REFLEX MICROSCOPIC (NOT AT [facility]) - Abnormal;  Notable for the following:    APPearance CLOUDY (*)    All other components within normal limits  CBC  TSH  T4, FREE  CBG MONITORING, ED    Imaging Review No results found.   I have personally reviewed and evaluated these images and lab results as part of my medical decision-making.   EKG Interpretation   Date/Time:  Friday February 05 2015 10:02:39 EDT Ventricular Rate:  71 PR Interval:  140 QRS Duration: 82 QT Interval:  394 QTC Calculation: 428 R Axis:   77 Text Interpretation:  Normal sinus rhythm Normal ECG since last tracing no  significant change Confirmed by MILLER  MD, BRIAN ([id number]) on 02/05/2015  11:16:30 AM      MDM   Final diagnoses:  Vertigo    [name] is a very friendly 39 year old African American lady presenting with a two week history of vertigo and light-headedness after re-starting her methimazole 2  months ago with a negative Dix Hallpike maneuver. She says she gets these symptoms frequently when she's taking methimazole, and this is indeed a known side effect of the drug. I called her endocrinologist and notified him of this adverse side effect; he would get in touch with her to discuss therapeutic options for her hyperthyroidism going forward. In the meantime, I recommended she discontinue the methimazole. She also complained of some sinus pressure for the last 2 weeks which I suspect is of viral etiology. I obtained a CBC given concern for agranulocytosis but her white count was fortunately normal. Additionally, her TSH and Free T4 were also within normal limits and she denied symptoms of hypo or hyperthyroidism. She was discharged home with follow-up with her endocrinologist and PCP.  Loleta Chance, MD 02/05/15 [place], MD 02/05/15 1535  ADDENDUM:  Upon further assessment of the patient, she was additionally orthostatic, complaining of lightheadedness when standing up, in addition to her complaints of vertigo. After getting 1 liter of  fluid, her pulse still rose from 70 to 110 upon standing, with a stable blood pressure. The patient did not feel safe going home because she feels so lightheaded, as she only lives with her 13 year old daughter. She adamantly requested admission.  I don't know what is causing her symptomatology; her volume status is optimal, she is not taking any medications that might cause her to be orthostatic, and her normal blood pressure but tachycardia with concomitant lightheadedness despite optimal volume status is odd to me. I considered Shy-Drager syndrome or Riley-Day syndrome as well but she doesn't show other neurologic signs concerning for these diseases. She does not have signs of ataxia on exam suggestive of a central vertigo nor does she have true leg weakness when lying down . There are no case reports of methimazole causing orthostatic hypotension, however it can cause vertigo which she also endorses.  Loleta Chance, MD 02/05/15 1718  Noemi Chapel, MD 02/11/15 2158

## 2015-02-05 NOTE — ED Notes (Signed)
Patient has returned from the bathroom; placed back on monitor, continuous pulse oximetry and blood pressure cuff

## 2015-02-05 NOTE — Telephone Encounter (Addendum)
Dr. Melburn Hake from [facility] ED called. Patient went to the ED today complaining of dizziness. Pt stated to the ED staff she thought her dizziness was related to her methimazole medication. Dr Sabra Heck wanted you know be advised the pt is going to stop taking the methimazole. Should the pt be started on another medication?  Please advise, Thanks!

## 2015-02-05 NOTE — ED Provider Notes (Signed)
The patient is a 39 year old female with history of hyperthyroidism, has been taking methimazole intermittently in the past, has been on this medication since June and has had a progressive intermittent severe dizziness with sweating that occurs occasionally. Today she had a particularly bad episode prompting her ER visit. She is extremely uncomfortable and this happens. She does not have chest pain or shortness of breath but does have significant dizziness and lightheadedness. At this time she has minimal symptoms but just before I went in the room the dizziness was intense. On my exam she has no numbness weakness or difficulty speaking, cranial nerves III through XII are normal, cardiac exam is normal, she is tearful but otherwise in no acute distress. We'll obtain TSH, basic labs, discussed with endocrinology regarding potential medication changes or alternative therapies. The patient has recently lost her medical insurance complicating her treatment course.   EKG Interpretation  Date/Time:  Friday February 05 2015 10:02:39 EDT Ventricular Rate:  71 PR Interval:  140 QRS Duration: 82 QT Interval:  394 QTC Calculation: 428 R Axis:   77 Text Interpretation:  Normal sinus rhythm Normal ECG since last tracing no significant change Confirmed by Allesandra Huebsch  MD, Chaim Gatley ([id number]) on 02/05/2015 11:16:30 AM      I saw and evaluated the patient, reviewed the resident's note and I agree with the findings and plan.    Final diagnoses:  Vertigo      Noemi Chapel, MD 02/05/15 1535

## 2015-02-05 NOTE — ED Notes (Signed)
Pt ambulated to restroom with unsteady gait and moderate assistance.

## 2015-02-05 NOTE — ED Notes (Signed)
Patient has returned back to room; patient placed on monitor, continuous pulse oximetry and blood pressure cuff; urine specimen collected and sent to lab for testing

## 2015-02-05 NOTE — ED Notes (Signed)
Patient states she is having dizziness.   Patient states "I believe that my thyroid is acting up again.  I didn't take my medicine last night.   I get sick if I take it and sick if I don't".   Patient denies other symptoms.

## 2015-02-05 NOTE — ED Notes (Signed)
Patient up ambulatory with stand by assistance to the bathroom at this time

## 2015-02-05 NOTE — Discharge Instructions (Signed)

## 2015-02-05 NOTE — H&P (Signed)
[facility] History and Physical  RONAE NOELL DQQ:[id number] DOB: Jun 14, 1975 DOA: 02/05/2015   PCP: Donnamae Jude, MD . She hasn't seen Dr. Kennon Rounds in a long time Specialists: Dr. Renato Shin is her endocrinologist  Chief Complaint: "Dizzy spells"  HPI: RAYANN JOLLEY is a 39 y.o. female with the past medical history of hyperthyroidism and asthma who presented to the ED with worsening dizziness and almost passing out. She has a lot of nonspecific complaints. She tells me that today when she woke up she felt dizzy. She felt like her heart was racing. She has sweaty palms. She denies that the room was spinning around her. She had a headache in the top of the head and has been having this headache for 2 days. No vision disturbances, no focal weakness. No nausea, vomiting. No fever. Has been feeling cold. Denies any problems with urination. She had chest pain 2 days ago, but none currently. No shortness of breath, no leg swelling. Dizziness is mainly with standing up and walking, but also sometimes when she lies flat on the bed. She had the back pain a few days ago which has now resolved. Denies any injuries. No diarrhea, no nausea, no vomiting. No dehydration. She worked outside yesterday, but only for short duration of time and she was keeping herself well hydrated. She denies passing out, but has had multiple occasions when she has felt  close to passing out. She attributes most of her symptoms to Methimazole. She tells me that whenever she takes this medicine she gets these kind of symptoms. But there is no direct correlation. She took a course of prednisone back in May or June for unspecified reasons, but doesn't take steroids on a regular basis.  Home Medications: THIS IS NOT A CURRENT MEDICATION LIST Prior to Admission medications   Medication Sig Start Date End Date Taking? Authorizing Provider  albuterol (PROVENTIL HFA;VENTOLIN HFA) 108 (90 BASE) MCG/ACT inhaler Inhale 2 puffs into  the lungs every 6 (six) hours as needed for wheezing. 06/08/14   Donnamae Jude, MD  aspirin 325 MG tablet Take 325-650 mg by mouth every 6 (six) hours as needed for mild pain.    Historical Provider, MD  cholecalciferol (VITAMIN D) 1000 UNITS tablet Take 2,000 Units by mouth daily.    Historical Provider, MD  fluticasone (FLONASE) 50 MCG/ACT nasal spray Place 2 sprays into both nostrils daily. 11/06/14   Gregor Hams, MD  hydrocortisone 2.5 % lotion Apply topically 2 (two) times daily. To the affected area for 5 days. 09/05/14   Waynetta Pean, PA-C  ibuprofen (ADVIL,MOTRIN) 600 MG tablet Take 1 tablet (600 mg total) by mouth every 8 (eight) hours as needed for moderate pain (Take with food). 06/29/13   Donnamae Jude, MD  ipratropium (ATROVENT) 0.06 % nasal spray Place 2 sprays into both nostrils 4 (four) times daily. For nasal congestion 10/10/14   Lutricia Feil, PA  meclizine (ANTIVERT) 32 MG tablet Take 1 tablet (32 mg total) by mouth 3 (three) times daily as needed. 02/05/15   Loleta Chance, MD  montelukast (SINGULAIR) 10 MG tablet Take 1 tablet (10 mg total) by mouth at bedtime. 10/10/14   Lutricia Feil, PA  Multiple Vitamins-Minerals (MULTIVITAMIN) tablet Take 1 tablet by mouth daily. 06/08/14   Donnamae Jude, MD  predniSONE (DELTASONE) 10 MG tablet Take 3 tablets (30 mg total) by mouth daily. 11/06/14   Gregor Hams, MD  ranitidine (ZANTAC) 150 MG capsule  Take 1 capsule (150 mg total) by mouth 2 (two) times daily. 11/06/14   Gregor Hams, MD    Allergies:  Allergies  Allergen Reactions  . Penicillins Other (See Comments)    Reaction unknown    Past Medical History: Past Medical History  Diagnosis Date  . HYPERTHYROIDISM 07/20/2009  . DEPRESSION, MAJOR, RECURRENT 07/26/2006  . ANXIETY 07/26/2006  . ASTHMA, INTERMITTENT 07/26/2006  . HYPERGLYCEMIA 09/28/2009  . CONSTIPATION, CHRONIC 06/22/2010  . Dizziness 11/09/2010  . OBESITY 01/31/2008  . Fibroid     Past Surgical History    Procedure Laterality Date  . Tubal ligation  2008  . Iud removal  10/07    Mirena removed 05/10/2004  . Pilonidal cyst excision    . Vaginal delivery      X 4  . Vaginal hysterectomy N/A 07/07/2013    Procedure: HYSTERECTOMY VAGINAL;  Surgeon: Donnamae Jude, MD;  Location: [facility];  Service: Gynecology;  Laterality: N/A;  . Bilateral salpingectomy Bilateral 07/07/2013    Procedure: BILATERAL SALPINGECTOMY;  Surgeon: Donnamae Jude, MD;  Location: [facility];  Service: Gynecology;  Laterality: Bilateral;    Social History: She lives in [place] with her family. 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 She quit smoking in 2007. Quit drinking alcohol in 2007. Usually independent with daily activities.  Family History:  Family History  Problem Relation Age of Onset  . Hypertension Other   There is also history of diabetes in the family.  Review of Systems - History obtained from the patient General ROS: negative Psychological ROS: negative Ophthalmic ROS: negative ENT ROS: Occasional earache Allergy and Immunology ROS: negative Hematological and Lymphatic ROS: negative Endocrine ROS: As in history of present illness Respiratory ROS: no cough, shortness of breath, or wheezing Cardiovascular ROS: no chest pain or dyspnea on exertion Gastrointestinal ROS: no abdominal pain, change in bowel habits, or black or bloody stools Genito-Urinary ROS: no dysuria, trouble voiding, or hematuria Musculoskeletal ROS: negative Neurological ROS: As in history of present illness Dermatological ROS: negative  Physical Examination  Filed Vitals:   02/05/15 1700 02/05/15 1715 02/05/15 1730 02/05/15 1732  BP: 117/65 114/68 112/62 112/62  Pulse: 68 66 64 66  Temp:      Resp: 16 18 16 16   Height:      Weight:      SpO2: 100% 100% 100% 100%    BP 112/62 mmHg  Pulse 66  Temp(Src) 97.9 F (36.6 C)  Resp 16  Ht 5' 11"  (1.803 m)  Wt 89.359 kg (197 lb)  BMI 27.49 kg/m2  SpO2 100%  LMP  06/17/2013  General appearance: alert, cooperative, appears stated age and no distress Head: Normocephalic, without obvious abnormality, atraumatic Eyes: conjunctivae/corneas clear. PERRL, EOM's intact. No nystagmus noted. Ears: normal TM's and external ear canals both ears Throat: lips, mucosa, and tongue normal; teeth and gums normal Neck: no adenopathy, no carotid bruit, no JVD, supple, symmetrical, trachea midline and thyroid not enlarged, symmetric, no tenderness/mass/nodules Resp: clear to auscultation bilaterally Cardio: regular rate and rhythm, S1, S2 normal, no murmur, click, rub or gallop GI: soft, nonspecific tenderness without any rebound, rigidity or guarding. No masses or organomegaly. bowel sounds normal; no masses,  no organomegaly Extremities: extremities normal, atraumatic, no cyanosis or edema Pulses: 2+ and symmetric Skin: Skin color, texture, turgor normal. No rashes or lesions Lymph nodes: Cervical, supraclavicular, and axillary nodes normal. Neurologic: Alert and oriented 3. Cranial nerves II-12 intact. Motor strength equal  bilateral upper and lower extremity. Gait was not assessed.  Laboratory Data: Results for orders placed or performed during the hospital encounter of 02/05/15 (from the past 48 hour(s))  Urinalysis, Routine w reflex microscopic (not at [facility])     Status: Abnormal   Collection Time: 02/05/15 10:27 AM  Result Value Ref Range   Color, Urine YELLOW YELLOW   APPearance CLOUDY (A) CLEAR   Specific Gravity, Urine 1.019 1.005 - 1.030   pH 6.5 5.0 - 8.0   Glucose, UA NEGATIVE NEGATIVE mg/dL   Hgb urine dipstick NEGATIVE NEGATIVE   Bilirubin Urine NEGATIVE NEGATIVE   Ketones, ur NEGATIVE NEGATIVE mg/dL   Protein, ur NEGATIVE NEGATIVE mg/dL   Urobilinogen, UA 0.2 0.0 - 1.0 mg/dL   Nitrite NEGATIVE NEGATIVE   Leukocytes, UA NEGATIVE NEGATIVE    Comment: MICROSCOPIC NOT DONE ON URINES WITH NEGATIVE PROTEIN, BLOOD, LEUKOCYTES, NITRITE, OR GLUCOSE <1000  mg/dL.  CBG monitoring, ED     Status: None   Collection Time: 02/05/15 10:39 AM  Result Value Ref Range   Glucose-Capillary 71 65 - 99 mg/dL  Basic metabolic panel     Status: Abnormal   Collection Time: 02/05/15 10:55 AM  Result Value Ref Range   Sodium 136 135 - 145 mmol/L   Potassium 3.7 3.5 - 5.1 mmol/L   Chloride 100 (L) 101 - 111 mmol/L   CO2 27 22 - 32 mmol/L   Glucose, Bld 81 65 - 99 mg/dL   BUN 10 6 - 20 mg/dL   Creatinine, Ser 0.75 0.44 - 1.00 mg/dL   Calcium 9.2 8.9 - 10.3 mg/dL   GFR calc non Af Amer >60 >60 mL/min   GFR calc Af Amer >60 >60 mL/min    Comment: (NOTE) The eGFR has been calculated using the CKD EPI equation. This calculation has not been validated in all clinical situations. eGFR's persistently <60 mL/min signify possible Chronic Kidney Disease.    Anion gap 9 5 - 15  CBC     Status: None   Collection Time: 02/05/15 10:55 AM  Result Value Ref Range   WBC 5.7 4.0 - 10.5 K/uL   RBC 4.75 3.87 - 5.11 MIL/uL   Hemoglobin 14.7 12.0 - 15.0 g/dL   HCT 42.5 36.0 - 46.0 %   MCV 89.5 78.0 - 100.0 fL   MCH 30.9 26.0 - 34.0 pg   MCHC 34.6 30.0 - 36.0 g/dL   RDW 13.0 11.5 - 15.5 %   Platelets 342 150 - 400 K/uL  TSH     Status: None   Collection Time: 02/05/15 12:13 PM  Result Value Ref Range   TSH 2.150 0.350 - 4.500 uIU/mL  T4, free     Status: None   Collection Time: 02/05/15 12:13 PM  Result Value Ref Range   Free T4 0.88 0.61 - 1.12 ng/dL    Radiology Reports: No results found.  My interpretation of Electrocardiogram: EKG shows sinus rhythm in 70s. Normal axis. Intervals are normal. No Q waves. No concerning ST or T-wave changes  Problem List  Active Problems:   Thyrotoxicosis   Near syncope   Headache   Assessment: This is a 39 year old African-American female with past medical history of hyperthyroidism who presents with the near syncopal episodes. Patient attributes all of her symptoms to methimazole, but her symptoms have been worse  this week, especially today. Nothing specific on physical examination is noted. Apparently when she was stood up in the ED, she became quite tachycardic. She  could not ambulate due to her symptoms. Thyroid levels are normal today. No evidence for infection on urinalysis. EKG is unremarkable.  Plan: #1 Near syncope with headache and palpitations: Patient exhibits inability to ambulate. She is unsafe for discharge from the emergency department. She will need further workup to rule out cardiac and neurological etiologies. Proceed with MRI of the brain. Proceed with echocardiogram. Get a chest x-ray. Check cortisol level in the morning. Orthostatics will be checked. She'll be given IV fluids. PT and OT to evaluate. Unclear if methimazole is actually responsible for her symptoms.  #2 history of hyperthyroidism: Thyroid function tests are normal today. Hold her methimazole for now.  #3 history of asthma: Stable  #4 History of PTSD and anxiety: Stable.   Home medication list needs to be reconciled. Pharmacy consulted.  DVT Prophylaxis: Lovenox Code Status: Full code Family Communication: Discussed with the patient  Disposition Plan: Observe to telemetry   Further management decisions will depend on results of further testing and patient's response to treatment.   [facility]  [facility] Pager [phone]  If 7PM-7AM, please contact night-coverage [email or web address] Password [email or web address]  02/05/2015, 6:12 PM

## 2015-02-06 DIAGNOSIS — E0590 Thyrotoxicosis, unspecified without thyrotoxic crisis or storm: Secondary | ICD-10-CM

## 2015-02-06 DIAGNOSIS — R42 Dizziness and giddiness: Principal | ICD-10-CM

## 2015-02-06 LAB — CBC
HCT: 37.6 % (ref 36.0–46.0)
Hemoglobin: 12.8 g/dL (ref 12.0–15.0)
MCH: 31.2 pg (ref 26.0–34.0)
MCHC: 34 g/dL (ref 30.0–36.0)
MCV: 91.7 fL (ref 78.0–100.0)
PLATELETS: 313 10*3/uL (ref 150–400)
RBC: 4.1 MIL/uL (ref 3.87–5.11)
RDW: 13.3 % (ref 11.5–15.5)
WBC: 5.9 10*3/uL (ref 4.0–10.5)

## 2015-02-06 LAB — COMPREHENSIVE METABOLIC PANEL
ALK PHOS: 42 U/L (ref 38–126)
ALT: 10 U/L — AB (ref 14–54)
AST: 19 U/L (ref 15–41)
Albumin: 3.1 g/dL — ABNORMAL LOW (ref 3.5–5.0)
Anion gap: 6 (ref 5–15)
BUN: 11 mg/dL (ref 6–20)
CALCIUM: 8.8 mg/dL — AB (ref 8.9–10.3)
CO2: 27 mmol/L (ref 22–32)
CREATININE: 0.78 mg/dL (ref 0.44–1.00)
Chloride: 108 mmol/L (ref 101–111)
Glucose, Bld: 81 mg/dL (ref 65–99)
Potassium: 4.1 mmol/L (ref 3.5–5.1)
Sodium: 141 mmol/L (ref 135–145)
Total Bilirubin: 0.4 mg/dL (ref 0.3–1.2)
Total Protein: 6.5 g/dL (ref 6.5–8.1)

## 2015-02-06 LAB — CORTISOL-AM, BLOOD: Cortisol - AM: 11.8 ug/dL (ref 6.7–22.6)

## 2015-02-06 MED ORDER — ENSURE ENLIVE PO LIQD
237.0000 mL | Freq: Three times a day (TID) | ORAL | Status: DC
  Administered 2015-02-06 – 2015-02-09 (×9): 237 mL via ORAL

## 2015-02-06 MED ORDER — MECLIZINE HCL 25 MG PO TABS
25.0000 mg | ORAL_TABLET | Freq: Three times a day (TID) | ORAL | Status: DC
  Administered 2015-02-06 – 2015-02-09 (×10): 25 mg via ORAL
  Filled 2015-02-06 (×11): qty 1

## 2015-02-06 MED ORDER — PROMETHAZINE HCL 25 MG PO TABS: 12.5000 mg | ORAL_TABLET | Freq: Four times a day (QID) | ORAL | Status: DC | PRN

## 2015-02-06 NOTE — Progress Notes (Signed)
Initial Nutrition Assessment  DOCUMENTATION CODES:   Not applicable  INTERVENTION:  - Continue Ensure Enlive and will increase order to TID per pt request - Will order snacks BID - RD will continue to monitor for needs  NUTRITION DIAGNOSIS:   Inadequate oral intake related to poor appetite as evidenced by per patient/family report.  GOAL:   Patient will meet greater than or equal to 90% of their needs  MONITOR:   PO intake, Supplement acceptance, Weight trends, Labs, I & O's  REASON FOR ASSESSMENT:   Malnutrition Screening Tool  ASSESSMENT:   39 y.o. female with the past medical history of hyperthyroidism and asthma who presented to the ED with worsening dizziness and almost passing out. She has a lot of nonspecific complaints. She tells me that today when she woke up she felt dizzy. She felt like her heart was racing. She has sweaty palms. She denies that the room was spinning around her. She had a headache in the top of the head and has been having this headache for 2 days. No vision disturbances, no focal weakness. No nausea, vomiting. No fever. Has been feeling cold. Denies any problems with urination. She had chest pain 2 days ago, but none currently.  Pt seen for MST. BMI indicates overweight status. Pt reports that she had breakfast this AM and chart review shows 100% completion of this meal.   She states that since admission she has had a good appetite and is eating well but that PTA her appetite would fluctuate. She states that she was very busy most days and would not take time to eat and when she did she would eat whatever was available at that time. Since admission, with not being busy and having set meals, she has been eating more and appetite has increased.  She drank 100% Ensure earlier today and is interested in increasing order to TID. She is also interested in receiving snacks between meals such as a small salad or fresh fruit.   Pt reports that over the past 1  year she lost weight from UBW of ~250 lbs and that in the past 2 weeks she has lost 10 lbs. This is not consistent with chart review of weight trends. Per chart, pt has lost 23 lbs (11% body weight) in the past 12 months, 9 lbs (4.5% body weight) in the past 1 month. Neither of these are significant for time frame.   Unable to perform physical assessment due to pt request. Meeting needs since admission. Medications reviewed. Labs reviewed.   Diet Order:  Diet regular Room service appropriate?: Yes; Fluid consistency:: Thin  Skin:  Reviewed, no issues  Last BM:  9/9  Height:   Ht Readings from Last 1 Encounters:  02/05/15 5\' 11"  (1.803 m)    Weight:   Wt Readings from Last 1 Encounters:  02/05/15 189 lb (85.73 kg)    Ideal Body Weight:  70.45 kg (kg)  BMI:  Body mass index is 26.37 kg/(m^2).  Estimated Nutritional Needs:   Kcal:  8502-7741  Protein:  65-75 grams  Fluid:  2-2.2 L/day  EDUCATION NEEDS:   No education needs identified at this time     Jarome Matin, RD, LDN Inpatient Clinical Dietitian Pager # [phone] After hours/weekend pager # [phone]

## 2015-02-06 NOTE — Evaluation (Signed)
Physical Therapy Evaluation Patient Details Name: Monica Chase MRN: [id number] DOB: 1975/10/09 Today's Date: 02/06/2015   History of Present Illness  Patient is a 39 yo female admitted 02/05/15 with dizziness with palpitations and clamminess.  Patient with near syncope.  PMH:  Hyperthyroidism, asthma, depression, anxiety, vertigo    Clinical Impression  Patient presents with problems listed below.  Will benefit from acute PT to maximize functional mobility prior to discharge home.  Initiated Vestibular Assessment today.  Possible Rt posterior canal BPPV.  Unable to complete testing - patient becoming clammy with increased HR and feeling "faint" in sitting.  Patient also complaining of "pressure" in the back of her head.  She also asked for anxiety medications.  RN notified of all of the above.  Will return later today as time allows to complete vestibular assessment.    Follow Up Recommendations Supervision for mobility/OOB;Outpatient PT (for Vestibular Rehab) - will continue to assess need for f/u    Equipment Recommendations  None recommended by PT    Recommendations for Other Services       Precautions / Restrictions Precautions Precautions: Fall Precaution Comments: Dizziness Restrictions Weight Bearing Restrictions: No      Mobility  Bed Mobility Overal bed mobility: Modified Independent             General bed mobility comments: In supine, patient with BP 125/57 and HR 72.  Patient required increased time due to dizziness with transition to sitting.  Dizziness lasting > 60 seconds.  In sitting, patient became clammy and HR increased to 85.  Patient reports feeling "faint" and had to lay down before BP could be taken again.  Transfers                    Ambulation/Gait                Stairs            Wheelchair Mobility    Modified Rankin (Stroke Patients Only)       Balance   Sitting-balance support: No upper extremity  supported;Feet supported Sitting balance-Leahy Scale: Good Sitting balance - Comments: Patient becomes dizzy in sitting, reports feeling "faint"                                     Pertinent Vitals/Pain Pain Assessment: No/denies pain (Patient reports feeling "pressure" in head at end of session)    Home Living Family/patient expects to be discharged to:: Private residence Living Arrangements: Children (78 yo daughter) Available Help at Discharge: Family;Available PRN/intermittently Type of Home: Apartment Home Access: Stairs to enter Entrance Stairs-Rails: Psychiatric nurse of Steps: 8 Home Layout: One level Home Equipment: Walker - 2 wheels      Prior Function Level of Independence: Independent         Comments: 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  Patient with negative Supine Roll test to both sides.  Did have "dizziness" with Rt Dix-Hallpike, but no nystagmus.  Negative Lt Dix-Hallpike.  Unable to complete further testing due to inability to tolerate prolonged sitting EOB.    Exercises        Assessment/Plan    PT Assessment Patient needs continued PT services  PT Diagnosis Difficulty walking (Dizziness)   PT Problem List Decreased activity tolerance;Decreased balance;Decreased mobility (Dizziness)  PT Treatment  Interventions Gait training;Functional mobility training;Therapeutic activities;Therapeutic exercise;Balance training;Patient/family education (Vestibular Rehab)   PT Goals (Current goals can be found in the Care Plan section) Acute Rehab PT Goals Patient Stated Goal: To feel better PT Goal Formulation: With patient Time For Goal Achievement: 02/13/15 Potential to Achieve Goals: Good    Frequency Min 4X/week   Barriers to discharge        Co-evaluation               End of Session   Activity Tolerance: Patient limited by fatigue (Limited by dizziness/feeling faint) Patient left: in bed;with call bell/phone within reach;with family/visitor present Nurse Communication: Other (comment) (Increase in HR and clammy with sitting; request anxiety meds)    Functional Assessment Tool Used: Clinical judgement Functional Limitation: Mobility: Walking and moving around Mobility: Walking and Moving Around Current Status ([id number]): At least 20 percent but less than 40 percent impaired, limited or restricted Mobility: Walking and Moving Around Goal Status [phone]): At least 1 percent but less than 20 percent impaired, limited or restricted    Time: [id number] and 16:21-16:38 PT Time Calculation (min) (ACUTE ONLY): 19 min + 17 min = 36 min total   Charges:   PT Evaluation $Initial PT Evaluation Tier I: 1 Procedure PT Treatments $Therapeutic Activity: 8-22 mins   PT G Codes:   PT G-Codes **NOT FOR INPATIENT CLASS** Functional Assessment Tool Used: Clinical judgement Functional Limitation: Mobility: Walking and moving around Mobility: Walking and Moving Around Current Status (K2409): At least 20 percent but less than 40 percent impaired, limited or restricted Mobility: Walking and Moving Around Goal Status [phone]): At least 1 percent but less than 20 percent impaired, limited or restricted    Despina Pole 02/06/2015, 5:10 PM Carita Pian. Sanjuana Kava, [place] Pager  [phone]

## 2015-02-06 NOTE — Progress Notes (Signed)
Patient arrived on unit via stretcher with nurse tech. Patient alert and oriented x4. Family at bedside. Patient oriented to staff, unit and room. Patient placed on telemetry, [place] notified. Patient rates pain 0/10. Skin assessment completed with two RN's. No skin issues noted. Safety Fall Prevention Plan was given, discussed and signed by patient. Orders have been reviewed and implemented. Call light has been placed within reach and bed alarm has been activated.  RN will continue to monitor the patient.  Nena Polio BSN, RN  Phone Number: [phone]

## 2015-02-06 NOTE — Progress Notes (Signed)
PT Cancellation Note  Patient Details Name: Monica Chase MRN: [id number] DOB: 27-Mar-1976   Cancelled Treatment:    Reason Eval/Treat Not Completed: Fatigue/lethargy limiting ability to participate  Attempted to see patient again to complete vestibular evaluation.  Patient declined, stating she was feeling poorly.  Will return in am.   Despina Pole 02/06/2015, 6:23 PM Carita Pian. Sanjuana Kava, [place] Pager [phone]

## 2015-02-06 NOTE — Progress Notes (Signed)
Physical Therapy Note Vestibular Assessment     02/06/15 1643  Vestibular Assessment  General Observation Patient becomes clammy, has increased HR, and feels "faint" with prolonged sitting.  Symptom Behavior  Type of Dizziness Lightheadedness (and Imbalance)  Frequency of Dizziness Throughout day  Duration of Dizziness Variable - Moving to sitting > 60 seconds. Position changes in bed 30 seconds  Aggravating Factors Supine to sit;Turning head quickly  Relieving Factors Lying supine;Head stationary  Occulomotor Exam  Comment Unable to test at this time  Vestibulo-Occular Reflex  Comment Unable to test at this time  Positional Testing  Dix-Hallpike Dix-Hallpike Right;Dix-Hallpike Left  Horizontal Canal Testing Horizontal Canal Right;Horizontal Canal Left  Dix-Hallpike Right  Dix-Hallpike Right Duration 30  Dix-Hallpike Right Symptoms No nystagmus;Other (comment) (Dizziness)  Dix-Hallpike Left  Dix-Hallpike Left Duration 0  Dix-Hallpike Left Symptoms No nystagmus  Horizontal Canal Right  Horizontal Canal Right Duration 0  Horizontal Canal Right Symptoms Normal  Horizontal Canal Left  Horizontal Canal Left Duration 0  Horizontal Canal Left Symptoms Normal  Cognition  Cognition Orientation Level Oriented x 4  Positional Sensitivities  Supine to Sitting 3 (Feeling "faint")  Orthostatics  Orthostatics Comment 02/05/15 - Orthostatics negative, but with increased HR in standing  Carita Pian. Sanjuana Kava, [place] Pager [phone]

## 2015-02-06 NOTE — Progress Notes (Signed)
[facility] PROGRESS NOTE  DARRIAN GOODWILL LKG:[id number] DOB: 1976-01-15 DOA: 02/05/2015  PCP: Donnamae Jude, MD  Brief HPI: 39 year old African-American female with a past medical history of hyperthyroidism, asthma, presented with complaints of dizziness and near syncope. Patient was very nonspecific with regards to her symptoms. She was noted to have very unsteady gait in the emergency department. She was subsequently hospitalized for further management.  Past medical history:  Past Medical History  Diagnosis Date  . HYPERTHYROIDISM 07/20/2009  . DEPRESSION, MAJOR, RECURRENT 07/26/2006  . ANXIETY 07/26/2006  . ASTHMA, INTERMITTENT 07/26/2006  . HYPERGLYCEMIA 09/28/2009  . CONSTIPATION, CHRONIC 06/22/2010  . Dizziness 11/09/2010  . OBESITY 01/31/2008  . Fibroid     Consultants: None  Procedures:  Echocardiogram is pending  Antibiotics: None  Subjective: Patient states that she felt extremely dizzy when she got up today to go to the bathroom. Today, she describes the sensation as of the room was spinning. She does mention a history of vertigo in the past. But she tells me that it has never been as severe as it is now. Feels anxious.  Objective: Vital Signs  Filed Vitals:   02/05/15 1845 02/05/15 2100 02/06/15 0452 02/06/15 0910  BP: 101/50 104/53 107/62 111/60  Pulse: 62 76 66 72  Temp:  97.6 F (36.4 C)  97.8 F (36.6 C)  TempSrc:    Oral  Resp: 14 18 20 19   Height:  5\' 11"  (1.803 m)    Weight:  85.73 kg (189 lb)    SpO2: 100% 100% 100% 100%    Intake/Output Summary (Last 24 hours) at 02/06/15 0915 Last data filed at 02/06/15 0900  Gross per 24 hour  Intake 1487.33 ml  Output      0 ml  Net 1487.33 ml   Filed Weights   02/05/15 1001 02/05/15 2100  Weight: 89.359 kg (197 lb) 85.73 kg (189 lb)    General appearance: alert, cooperative, appears stated age and no distress Resp: clear to auscultation bilaterally Cardio: regular rate and rhythm, S1, S2  normal, no murmur, click, rub or gallop GI: soft, non-tender; bowel sounds normal; no masses,  no organomegaly Extremities: extremities normal, atraumatic, no cyanosis or edema Neurologic: No focal deficits  Lab Results:  Basic Metabolic Panel:  Recent Labs Lab 02/05/15 1055 02/06/15 0519  NA 136 141  K 3.7 4.1  CL 100* 108  CO2 27 27  GLUCOSE 81 81  BUN 10 11  CREATININE 0.75 0.78  CALCIUM 9.2 8.8*   Liver Function Tests:  Recent Labs Lab 02/06/15 0519  AST 19  ALT 10*  ALKPHOS 42  BILITOT 0.4  PROT 6.5  ALBUMIN 3.1*   CBC:  Recent Labs Lab 02/05/15 1055 02/06/15 0519  WBC 5.7 5.9  HGB 14.7 12.8  HCT 42.5 37.6  MCV 89.5 91.7  PLT 342 313    CBG:  Recent Labs Lab 02/05/15 1039  GLUCAP 71    No results found for this or any previous visit (from the past 240 hour(s)).    Studies/Results: Dg Chest 2 View  02/05/2015   CLINICAL DATA:  Dizziness  EXAM: CHEST  2 VIEW  COMPARISON:  08/09/2004  FINDINGS: Suboptimal radiographic technique in the upper lobes cannot be evaluated.  Heart size upper normal. Limited evaluation of the mid to lower lung zones reveals no definite infiltrate. No pleural effusion.  IMPRESSION: No acute findings but the PA view should be repeated as it is not of diagnostic quality.  Electronically Signed   By: Skipper Cliche M.D.   On: 02/05/2015 19:33   Mr Jeri Cos VU Contrast  02/05/2015   CLINICAL DATA:  Headache and dizziness.  EXAM: MRI HEAD WITHOUT AND WITH CONTRAST  TECHNIQUE: Multiplanar, multiecho pulse sequences of the brain and surrounding structures were obtained without and with intravenous contrast.  CONTRAST:  32mL MULTIHANCE GADOBENATE DIMEGLUMINE 529 MG/ML IV SOLN  COMPARISON:  None.  FINDINGS: The diffusion-weighted images demonstrate no evidence for acute or subacute infarction. Acute hemorrhage or mass lesion is present. The ventricles are of normal size. No significant white matter disease is present. This basal ganglia  and brainstem are within normal limits.  Flow is present in the major intracranial arteries. The globes and orbits are intact. A polyp or mucous retention cyst is present in the right maxillary sinus. The remaining paranasal sinuses and the mastoid air cells are clear.  Skullbase is within normal limits. Midline structures are unremarkable.  IMPRESSION: 1. Normal MRI appearance the brain. 2. Small polyp or mucous retention cyst posteriorly in the right maxillary sinus.   Electronically Signed   By: San Morelle M.D.   On: 02/05/2015 20:45    Medications:  Scheduled: . feeding supplement (ENSURE ENLIVE)  237 mL Oral TID BM  . meclizine  25 mg Oral TID  . sodium chloride  3 mL Intravenous Q12H   Continuous:  YEB:[id number] **OR** acetaminophen, albuterol, ondansetron **OR** ondansetron (ZOFRAN) IV, promethazine  Assessment/Plan:  Active Problems:   Thyrotoxicosis   Near syncope   Headache    Near syncope with headache and palpitations/vertigo Today, she describes symptoms that are more consistent with vertigo. MRI brain did not show any acute findings. Examination of the ears yesterday did not show any acute findings. Echocardiogram is pending. Initiate meclizine. Phenergan as needed. PT and OT to evaluate. Vestibular rehabilitation. Cortisol level 11.8. Orthostatics have not been checked yet. Okay to stop IV fluids.   History of hyperthyroidism Thyroid function tests were normal yesterday. Holding her methimazole as patient feels that this medication is responsible for her symptoms.   History of Asthma Stable  History of PTSD and anxiety Stable.   DVT Prophylaxis: SCDs   Code Status: Full code Family Communication: Discussed with the patient  Disposition Plan: Await echocardiogram. Await PT and OT evaluation.       [facility]  [facility] Pager [phone] 02/06/2015, 9:15 AM  If 7PM-7AM, please contact night-coverage at [email or web address], password  [facility]

## 2015-02-07 ENCOUNTER — Observation Stay (HOSPITAL_BASED_OUTPATIENT_CLINIC_OR_DEPARTMENT_OTHER): Payer: Medicaid Other

## 2015-02-07 DIAGNOSIS — R0600 Dyspnea, unspecified: Secondary | ICD-10-CM

## 2015-02-07 MED ORDER — DIAZEPAM 5 MG PO TABS
5.0000 mg | ORAL_TABLET | Freq: Two times a day (BID) | ORAL | Status: DC
  Administered 2015-02-07 – 2015-02-09 (×4): 5 mg via ORAL
  Filled 2015-02-07 (×5): qty 1

## 2015-02-07 MED ORDER — SODIUM CHLORIDE 0.9 % IV SOLN
INTRAVENOUS | Status: AC
  Administered 2015-02-07 – 2015-02-08 (×2): via INTRAVENOUS

## 2015-02-07 NOTE — Progress Notes (Signed)
Physical Therapy Treatment Patient Details Name: Monica Chase MRN: [id number] DOB: [birth year] Today's Date: 02/07/2015    History of Present Illness Patient is a 39 y.o. female admitted 02/05/15 with dizziness with palpitations and clamminess.  Patient with near syncope.  PMH:  Hyperthyroidism, asthma, depression, anxiety, vertigo    PT Comments    Patient continues to be limited in mobility by lightheadedness.  Was able to ambulate 5' with RW and min assist today.  Motivated to improve.  Feel patient would benefit from Inpatient Rehab consult to return patient to highest functional level prior to return home with 50 yo daughter.   Follow Up Recommendations  Supervision for mobility/OOB;CIR     Equipment Recommendations  Rolling walker with 5" wheels    Recommendations for Other Services Rehab consult     Precautions / Restrictions Precautions Precautions: Fall Precaution Comments: Dizziness Restrictions Weight Bearing Restrictions: No    Mobility  Bed Mobility Overal bed mobility: Modified Independent             General bed mobility comments: Increased time due to dizziness.  Patient reports increased dizziness and feeling "faint" with sitting.  Dizziness is ongoing.    Transfers Overall transfer level: Needs assistance Equipment used: Rolling walker (2 wheeled);1 person hand held assist Transfers: Sit to/from [facility] Sit to Stand: Min guard Stand pivot transfers: Min assist       General transfer comment: Verbal cues for hand placement.  Able to perform stand pivot transfer recliner <> BSC with min assist for balance/safety.  Patient able to stand from recliner, chair, and bed with min guard assist.  On 2 occasions, required assist to return to sitting due to lightheadedness during gait.  Note HR increased 74 in sitting to 115 in stance (41).  Did return to 70-80's in sitting.  Ambulation/Gait Ambulation/Gait assistance: Min guard;Min  assist Ambulation Distance (Feet): 5 Feet (5' x4 with sitting rest breaks.) Assistive device: Rolling walker (2 wheeled) Gait Pattern/deviations: Step-through pattern;Decreased step length - right;Decreased step length - left;Decreased stride length;Shuffle Gait velocity: decreased Gait velocity interpretation: Below normal speed for age/gender General Gait Details: Patient with slow, guarded gait with RW for support.  Able to ambulate very short distances, and becomes lightheaded.  Did ambulate x4 with sitting rest breaks.   Stairs            Wheelchair Mobility    Modified Rankin (Stroke Patients Only)       Balance           Standing balance support: Single extremity supported Standing balance-Leahy Scale: Poor                      Cognition Arousal/Alertness: Awake/alert Behavior During Therapy: WFL for tasks assessed/performed Overall Cognitive Status: Within Functional Limits for tasks assessed                      Exercises      General Comments General comments (skin integrity, edema, etc.): Had patient focus on sitting upright today in bed and chair.        Pertinent Vitals/Pain Pain Assessment: No/denies pain (Continues to c/o "pressure" in back of head)    Home Living                      Prior Function            PT Goals (current goals can now be found in  the care plan section) Progress towards PT goals: Progressing toward goals    Frequency  Min 4X/week    PT Plan Discharge plan needs to be updated;Equipment recommendations need to be updated    Co-evaluation             End of Session Equipment Utilized During Treatment: Gait belt Activity Tolerance: Other (comment) (Limited by lightheadedness) Patient left: in bed;with call bell/phone within reach;with family/visitor present     Time: 1031-5945 PT Time Calculation (min) (ACUTE ONLY): 32 min  Charges:  $Gait Training: 23-37 mins                     G Codes:      Despina Pole March 03, 2015, 7:35 PM Carita Pian. Sanjuana Kava, [place] Pager [phone]

## 2015-02-07 NOTE — Progress Notes (Signed)
Physical Therapy Treatment Patient Details Name: Monica Chase MRN: [id number] DOB: [birth year] Today's Date: 02/07/2015    History of Present Illness Patient is a 39 yo female admitted 02/05/15 with dizziness with palpitations and clamminess.  Patient with near syncope.  PMH:  Hyperthyroidism, asthma, depression, anxiety, vertigo    PT Comments    Patient continues to have ongoing dizziness (almost constant), with increase in feeling faint, clammy and increased HR in stance.  Patient continues to c/o pressure in head during session.  Difficulty with oculomotor testing today - eye movements increase dizziness.  Patient able to stand for 60-90 seconds today - unable to ambulate safely.  Will return later today for continued PT.   Follow Up Recommendations  Supervision for mobility/OOB;Outpatient PT (for Vestibular Rehab)     Equipment Recommendations  None recommended by PT    Recommendations for Other Services       Precautions / Restrictions Precautions Precautions: Fall Precaution Comments: Dizziness Restrictions Weight Bearing Restrictions: No    Mobility  Bed Mobility Overal bed mobility: Modified Independent             General bed mobility comments: Increased time due to dizziness.  Patient reports increased dizziness and feeling "faint" with sitting.  Dizziness is ongoing.    Transfers Overall transfer level: Needs assistance Equipment used: 1 person hand held assist (Bedrail in other hand) Transfers: Sit to/from Stand Sit to Stand: Min guard         General transfer comment: Patient able to move to standing using bedrail with increased time.  Reported increased dizziness and feeling "faint" in standing.  Noted HR increased from 78 in sitting to 115 in stance.  Patient clammy with sitting/stance.  Returned to sitting with assist.  Ambulation/Gait                 Stairs            Wheelchair Mobility    Modified Rankin (Stroke Patients  Only)       Balance Overall balance assessment: Needs assistance   Sitting balance-Leahy Scale: Good     Standing balance support: Single extremity supported Standing balance-Leahy Scale: Poor Standing balance comment: Balance decreased due to dizziness and lightheadedness.                    Cognition Arousal/Alertness: Awake/alert Behavior During Therapy: Anxious;Flat affect Overall Cognitive Status: Within Functional Limits for tasks assessed                      Exercises      General Comments General comments (skin integrity, edema, etc.): Continued Vestibular Assessment.  Difficulty with oculomotor assessment.  Patient having difficulty keeping eyes on target (pen) during smooth pursuits especially at end ranges.  Did not appear to have nystagmus.  Difficulty with saccades - eye movement increased dizziness.  Increased dizziness with head shaking.  Patient reports her current symptoms are nothing like her prior episode with Vertigo (spinning and meds/rest helped symptoms).  This is described as an ongoing dizziness and faint feeling with head "pressure".  Meds yesterday did not help symptoms.      Pertinent Vitals/Pain Pain Assessment: No/denies pain (Cont to c/o pressure in back of head during session.)   Supine:   BP 109/55       HR 80 Sitting:    BP 123/77       HR 78 Stance:  BP  133/80  HR 115     *0 min - unable to get 3 min vitals in stance    Home Living                      Prior Function            PT Goals (current goals can now be found in the care plan section) Progress towards PT goals: Not progressing toward goals - comment (Continued with 8/10 dizziness, feeling faint.)    Frequency  Min 4X/week    PT Plan Current plan remains appropriate    Co-evaluation             End of Session   Activity Tolerance: Other (comment) (Limited due to dizziness/feeling faint) Patient left: in bed;with call bell/phone within  reach (Encouraged patient to keep [facility] elevated today.)     Time: 8882-8003 PT Time Calculation (min) (ACUTE ONLY): 24 min  Charges:  $Therapeutic Activity: 23-37 mins                    G Codes:      Despina Pole 2015-02-16, 10:23 AM Carita Pian. Sanjuana Kava, [place] Pager [phone]

## 2015-02-07 NOTE — Progress Notes (Signed)
Pt was to dizzy to do standing weight

## 2015-02-07 NOTE — Evaluation (Signed)
Occupational Therapy Evaluation Patient Details Name: Monica Chase MRN: [id number] DOB: 03-28-1976 Today's Date: 02/07/2015    History of Present Illness Patient is a 39 y.o. female admitted 02/05/15 with dizziness with palpitations and clamminess.  Patient with near syncope.  PMH:  Hyperthyroidism, asthma, depression, anxiety, vertigo   Clinical Impression   Pt admitted with above. Feel pt will benefit from acute OT to increase independence prior to d/c. Recommending CIR for rehab.    Follow Up Recommendations  CIR    Equipment Recommendations  3 in 1 bedside comode;Tub/shower bench    Recommendations for Other Services       Precautions / Restrictions Precautions Precautions: Fall Precaution Comments: Dizziness Restrictions Weight Bearing Restrictions: No      Mobility Bed Mobility Overal bed mobility: Modified Independent                Transfers Overall transfer level: Needs assistance   Transfers: Sit to/from Stand Sit to Stand: Min assist              Balance    Min assist to take a few steps and turn to sit on bed.                                          ADL Overall ADL's : Needs assistance/impaired     Grooming: Set up;Sitting           Upper Body Dressing : Set up;Sitting   Lower Body Dressing: Min guard;Minimal assistance;Sit to/from stand   Toilet Transfer: Minimal assistance;Ambulation (sit to stand from chair; took a few steps and turned to sit)           Functional mobility during ADLs: Minimal assistance General ADL Comments: Educated on alternative techniques for LB ADLs (leaning or rolling in bed). Educated on tub transfer technique with tub bench. Educated on options for shower chair. Educated on safety.     Vision     Perception     Praxis      Pertinent Vitals/Pain Pain Assessment: No/denies pain in session (however pain in shoulders when OT tested strength; grimacing); HR in 70s-80s  when sitting in chair and went up to around 116 when pt stood up, but HR did trend down.     Hand Dominance     Extremity/Trunk Assessment Upper Extremity Assessment Upper Extremity Assessment: Generalized weakness   Lower Extremity Assessment Lower Extremity Assessment: Defer to PT evaluation       Communication Communication Communication: No difficulties   Cognition Arousal/Alertness: Awake/alert Behavior During Therapy: WFL for tasks assessed/performed Overall Cognitive Status: Within Functional Limits for tasks assessed                     General Comments       Exercises       Shoulder Instructions      Home Living Family/patient expects to be discharged to:: Private residence Living Arrangements: Children (36 y.o. daughter) Available Help at Discharge: Family;Available PRN/intermittently Type of Home: Apartment Home Access: Stairs to enter Entrance Stairs-Number of Steps: 8 Entrance Stairs-Rails: Right;Left Home Layout: One level     Bathroom Shower/Tub: Tub/shower unit         Home Equipment: Environmental consultant - 2 wheels          Prior Functioning/Environment Level of Independence: Needs assistance Gait / Transfers Assistance Needed: reports she  holds to things or daughter with ambulating at times  ADL's / Homemaking Assistance Needed: daughter provides supervision for showering; assists with getting to bathroom   Comments: Patient works as Training and development officer in [employer]    OT Diagnosis: Generalized weakness   OT Problem List: Decreased strength;Pain;Decreased activity tolerance;Impaired balance (sitting and/or standing);Decreased knowledge of use of DME or AE   OT Treatment/Interventions: Self-care/ADL training;Therapeutic exercise;DME and/or AE instruction;Therapeutic activities;Patient/family education;Balance training    OT Goals(Current goals can be found in the care plan section) Acute Rehab OT Goals Patient Stated Goal: not stated OT Goal  Formulation: With patient Time For Goal Achievement: 02/14/15 Potential to Achieve Goals: Good ADL Goals Pt Will Perform Lower Body Bathing: sit to/from stand;with set-up;with supervision Pt Will Perform Lower Body Dressing: with set-up;with supervision;sit to/from stand Pt Will Transfer to Toilet: with supervision;ambulating Pt Will Perform Toileting - Clothing Manipulation and hygiene: with supervision;sit to/from stand Additional ADL Goal #1: Pt will independently perform HEP for bilateral UEs to increase strength.  OT Frequency: Min 2X/week   Barriers to D/C:            Co-evaluation              End of Session Equipment Utilized During Treatment: Gait belt Nurse Communication:  (spoke with nurse tech about mobility and HR)  Activity Tolerance: Other (comment) (dizziness) Patient left: in bed;with call bell/phone within reach   Time: 1330-1344 OT Time Calculation (min): 14 min Charges:  OT General Charges $OT Visit: 1 Procedure OT Evaluation $Initial OT Evaluation Tier I: 1 Procedure G-Codes: OT G-codes **NOT FOR INPATIENT CLASS** Functional Assessment Tool Used: clinical judgment Functional Limitation: Self care Self Care Current Status (Z6109): At least 20 percent but less than 40 percent impaired, limited or restricted Self Care Goal Status (U0454): At least 1 percent but less than 20 percent impaired, limited or restricted  Benito Mccreedy OTR/L [phone] 02/07/2015, 2:54 PM

## 2015-02-07 NOTE — Progress Notes (Signed)
  Echocardiogram 2D Echocardiogram has been performed.  Monica Chase 02/07/2015, 8:58 AM

## 2015-02-07 NOTE — Progress Notes (Signed)
[facility] PROGRESS NOTE  Monica Chase GNF:[id number] DOB: [birth year] DOA: 02/05/2015  PCP: None  Brief HPI: 39 year old African-American female with a past medical history of hyperthyroidism, asthma, presented with complaints of dizziness and near syncope. Patient was very nonspecific with regards to her symptoms. She was noted to have very unsteady gait in the emergency department. She was subsequently hospitalized for further management.  Past medical history:  Past Medical History  Diagnosis Date  . HYPERTHYROIDISM 07/20/2009  . DEPRESSION, MAJOR, RECURRENT 07/26/2006  . ANXIETY 07/26/2006  . ASTHMA, INTERMITTENT 07/26/2006  . HYPERGLYCEMIA 09/28/2009  . CONSTIPATION, CHRONIC 06/22/2010  . Dizziness 11/09/2010  . OBESITY 01/31/2008  . Fibroid     Consultants: None  Procedures:  Echocardiogram Study Conclusions - Left ventricle: The cavity size was normal. Systolic function wasnormal. The estimated ejection fraction was in the range of 55%to 60%. Wall motion was normal; there were no regional wallmotion abnormalities. Left ventricular diastolic functionparameters were normal.  Antibiotics: None  Subjective: Patient continues to have dizzy spells. Has found it difficult to ambulate to the bathroom and back. No syncopal episodes.   Objective: Vital Signs  Filed Vitals:   02/06/15 1752 02/06/15 1955 02/07/15 0536 02/07/15 0830  BP: 113/70 113/64 111/57 111/63  Pulse: 81 73 66 70  Temp: 97.9 F (36.6 C) 97.9 F (36.6 C) 97.7 F (36.5 C) 97.8 F (36.6 C)  TempSrc: Oral   Oral  Resp: 18 19 19 19   Height:      Weight:      SpO2: 100% 100% 100% 100%    Intake/Output Summary (Last 24 hours) at 02/07/15 1101 Last data filed at 02/07/15 0830  Gross per 24 hour  Intake    840 ml  Output   1250 ml  Net   -410 ml   Filed Weights   02/05/15 1001 02/05/15 2100  Weight: 89.359 kg (197 lb) 85.73 kg (189 lb)    General appearance: alert, cooperative, appears  stated age and no distress Resp: clear to auscultation bilaterally Cardio: regular rate and rhythm, S1, S2 normal, no murmur, click, rub or gallop GI: soft, non-tender; bowel sounds normal; no masses,  no organomegaly Extremities: extremities normal, atraumatic, no cyanosis or edema Neurologic: Alert and oriented 3. No cranial nerve deficits. Motor strength equal bilateral upper and lower extremities.  Lab Results:  Basic Metabolic Panel:  Recent Labs Lab 02/05/15 1055 02/06/15 0519  NA 136 141  K 3.7 4.1  CL 100* 108  CO2 27 27  GLUCOSE 81 81  BUN 10 11  CREATININE 0.75 0.78  CALCIUM 9.2 8.8*   Liver Function Tests:  Recent Labs Lab 02/06/15 0519  AST 19  ALT 10*  ALKPHOS 42  BILITOT 0.4  PROT 6.5  ALBUMIN 3.1*   CBC:  Recent Labs Lab 02/05/15 1055 02/06/15 0519  WBC 5.7 5.9  HGB 14.7 12.8  HCT 42.5 37.6  MCV 89.5 91.7  PLT 342 313    CBG:  Recent Labs Lab 02/05/15 1039  GLUCAP 71    No results found for this or any previous visit (from the past 240 hour(s)).    Studies/Results: Dg Chest 2 View  02/05/2015   CLINICAL DATA:  Dizziness  EXAM: CHEST  2 VIEW  COMPARISON:  08/09/2004  FINDINGS: Suboptimal radiographic technique in the upper lobes cannot be evaluated.  Heart size upper normal. Limited evaluation of the mid to lower lung zones reveals no definite infiltrate. No pleural effusion.  IMPRESSION: No acute  findings but the PA view should be repeated as it is not of diagnostic quality.   Electronically Signed   By: Skipper Cliche M.D.   On: 02/05/2015 19:33   Mr Jeri Cos ML Contrast  02/05/2015   CLINICAL DATA:  Headache and dizziness.  EXAM: MRI HEAD WITHOUT AND WITH CONTRAST  TECHNIQUE: Multiplanar, multiecho pulse sequences of the brain and surrounding structures were obtained without and with intravenous contrast.  CONTRAST:  36mL MULTIHANCE GADOBENATE DIMEGLUMINE 529 MG/ML IV SOLN  COMPARISON:  None.  FINDINGS: The diffusion-weighted images  demonstrate no evidence for acute or subacute infarction. Acute hemorrhage or mass lesion is present. The ventricles are of normal size. No significant white matter disease is present. This basal ganglia and brainstem are within normal limits.  Flow is present in the major intracranial arteries. The globes and orbits are intact. A polyp or mucous retention cyst is present in the right maxillary sinus. The remaining paranasal sinuses and the mastoid air cells are clear.  Skullbase is within normal limits. Midline structures are unremarkable.  IMPRESSION: 1. Normal MRI appearance the brain. 2. Small polyp or mucous retention cyst posteriorly in the right maxillary sinus.   Electronically Signed   By: San Morelle M.D.   On: 02/05/2015 20:45    Medications:  Scheduled: . feeding supplement (ENSURE ENLIVE)  237 mL Oral TID BM  . meclizine  25 mg Oral TID  . sodium chloride  3 mL Intravenous Q12H   Continuous: . sodium chloride     YYT:[id number] **OR** acetaminophen, albuterol, ondansetron **OR** ondansetron (ZOFRAN) IV, promethazine  Assessment/Plan:  Active Problems:   Thyrotoxicosis   Near syncope   Headache    Near syncope with headache and palpitations/vertigo Continues to have significant symptoms. Workup has been negative so far including MRI brain and echocardiogram. Flow was noted in all the intracranial vessels on the MRI. Symptoms are suggestive of vertigo. Started on meclizine. Not much improvement. We will add Valium today. Continue PT and OT. Continue vestibular rehabilitation. Physical therapist did mention some dizziness especially when she got up from a sitting position. Give her fluids for another 24 hours. Her symptoms were discussed in detail with the patient. She was told that she may take a few days to weeks to recover completely. She was disappointed to hear this. But I have reassured her that we have done all the major testing that needs to be done in the  hospital.   History of hyperthyroidism Thyroid function tests were normal at the time of admission. Holding her methimazole as patient feels that this medication is responsible for her symptoms.   History of Asthma Stable  History of PTSD and anxiety Stable.   DVT Prophylaxis: SCDs   Code Status: Full code Family Communication: Discussed with the patient  Disposition Plan: Await some more improvement in her symptoms. Await repeat physical and occupational therapy evaluation. Anticipate discharge tomorrow.       [facility]  [facility] Pager [phone] 02/07/2015, 11:01 AM  If 7PM-7AM, please contact night-coverage at [email or web address], password [facility]

## 2015-02-08 LAB — VITAMIN B12: VITAMIN B 12: 983 pg/mL — AB (ref 180–914)

## 2015-02-08 MED ORDER — MIDODRINE HCL 5 MG PO TABS: 2.5000 mg | ORAL_TABLET | Freq: Three times a day (TID) | ORAL | Status: DC

## 2015-02-08 MED ORDER — MIDODRINE HCL 5 MG PO TABS
2.5000 mg | ORAL_TABLET | Freq: Three times a day (TID) | ORAL | Status: DC
  Administered 2015-02-08 – 2015-02-09 (×3): 2.5 mg via ORAL
  Filled 2015-02-08 (×3): qty 1

## 2015-02-08 NOTE — Progress Notes (Signed)
[facility] PROGRESS NOTE  Monica Chase:[id number] DOB: [birth year] DOA: 02/05/2015  PCP: None  Brief HPI: 39 year old African-American female with a past medical history of hyperthyroidism, asthma, presented with complaints of dizziness and near syncope. Patient was very nonspecific with regards to her symptoms. She was noted to have very unsteady gait in the emergency department. She was subsequently hospitalized for further management.  Past medical history:  Past Medical History  Diagnosis Date  . HYPERTHYROIDISM 07/20/2009  . DEPRESSION, MAJOR, RECURRENT 07/26/2006  . ANXIETY 07/26/2006  . ASTHMA, INTERMITTENT 07/26/2006  . HYPERGLYCEMIA 09/28/2009  . CONSTIPATION, CHRONIC 06/22/2010  . Dizziness 11/09/2010  . OBESITY 01/31/2008  . Fibroid     Consultants: None  Procedures:  Echocardiogram Study Conclusions - Left ventricle: The cavity size was normal. Systolic function wasnormal. The estimated ejection fraction was in the range of 55%to 60%. Wall motion was normal; there were no regional wallmotion abnormalities. Left ventricular diastolic functionparameters were normal.  Antibiotics: None  Subjective: Patient does not feel much better. Continues to feel unsteady, especially with ambulating. Complains of a pressure-like sensation in her head.   Objective: Vital Signs  Filed Vitals:   02/07/15 0830 02/07/15 1423 02/07/15 2212 02/08/15 0519  BP: 111/63 114/63 103/54 109/49  Pulse: 70 77 70 63  Temp: 97.8 F (36.6 C) 98.1 F (36.7 C) 97.7 F (36.5 C) 97.8 F (36.6 C)  TempSrc: Oral Oral Oral Oral  Resp: 19 19 20 18   Height:      Weight:      SpO2: 100% 100% 99% 100%    Intake/Output Summary (Last 24 hours) at 02/08/15 0956 Last data filed at 02/08/15 0800  Gross per 24 hour  Intake   2217 ml  Output   1850 ml  Net    367 ml   Filed Weights   02/05/15 1001 02/05/15 2100  Weight: 89.359 kg (197 lb) 85.73 kg (189 lb)    General appearance:  alert, cooperative, appears stated age and no distress Resp: clear to auscultation bilaterally Cardio: regular rate and rhythm, S1, S2 normal, no murmur, click, rub or gallop GI: soft, non-tender; bowel sounds normal; no masses,  no organomegaly Neurologic: Alert and oriented 3. No cranial nerve deficits. Motor strength equal bilateral upper and lower extremities.  Lab Results:  Basic Metabolic Panel:  Recent Labs Lab 02/05/15 1055 02/06/15 0519  NA 136 141  K 3.7 4.1  CL 100* 108  CO2 27 27  GLUCOSE 81 81  BUN 10 11  CREATININE 0.75 0.78  CALCIUM 9.2 8.8*   Liver Function Tests:  Recent Labs Lab 02/06/15 0519  AST 19  ALT 10*  ALKPHOS 42  BILITOT 0.4  PROT 6.5  ALBUMIN 3.1*   CBC:  Recent Labs Lab 02/05/15 1055 02/06/15 0519  WBC 5.7 5.9  HGB 14.7 12.8  HCT 42.5 37.6  MCV 89.5 91.7  PLT 342 313    CBG:  Recent Labs Lab 02/05/15 1039  GLUCAP 71    No results found for this or any previous visit (from the past 240 hour(s)).    Studies/Results: No results found.  Medications:  Scheduled: . diazepam  5 mg Oral BID  . feeding supplement (ENSURE ENLIVE)  237 mL Oral TID BM  . meclizine  25 mg Oral TID  . midodrine  2.5 mg Oral TID WC  . sodium chloride  3 mL Intravenous Q12H   Continuous: . sodium chloride 100 mL/hr at 02/08/15 0942   DGL:[id number] **  OR** acetaminophen, albuterol, ondansetron **OR** ondansetron (ZOFRAN) IV, promethazine  Assessment/Plan:  Active Problems:   Thyrotoxicosis   Near syncope   Headache    Near syncope with headache and palpitations/vertigo/?Orthostatic Etiology remains unclear for the most part. Continues to have significant symptoms. Workup has been negative so far including MRI brain and echocardiogram. Flow was noted in all the intracranial vessels on the MRI. Symptoms are suggestive of vertigo. Started on meclizine. Not much improvement. Valium was added. She was evaluated by physical therapy. Her  blood pressure did drop and heart rate did go up. There could be an element of orthostatic hypotension. Low-dose midodrine will be initiated. Continue PT and OT. Okay to stop her IV fluids. Her symptoms were discussed in detail with the patient and her aunt. She was told that she may take a few days to weeks to recover completely. She was disappointed to hear this. Patient is very scared about going back home due to safety reasons. I have offered her consult with social worker for placement. They also raised possibility of patient being transferred to another facility. I have explained to them there is no real medical acuity at this time that needs transfer to a higher level of care. Have told them that this problem will need continued outpatient evaluation. We will also check a B-12 level, although her symptoms are not suggestive of subacute combined degenerative process. I have reassured her that we have done all the major testing that needs to be done in the hospital.   History of hyperthyroidism Thyroid function tests were normal at the time of admission. Holding her methimazole as patient feels that this medication is responsible for her symptoms.   History of Asthma Stable  History of PTSD and anxiety Stable.   DVT Prophylaxis: SCDs   Code Status: Full code Family Communication: Discussed with the patient  Disposition Plan: Anticipate discharge tomorrow.       [facility]  [facility] Pager [phone] 02/08/2015, 9:56 AM  If 7PM-7AM, please contact night-coverage at [email or web address], password [facility]

## 2015-02-08 NOTE — Progress Notes (Signed)
Inpatient Rehabilitation We have received a prescreen request for possible IP Rehab.    Pt. Currently has IP Rehab consult orders.  Will await results of consult and admissions coordinator will follow up as appropriate.    [place] Admissions Coordinator Cell [phone] Office [phone]

## 2015-02-08 NOTE — Progress Notes (Signed)
Thank you for consult on Monica Chase. She was admitted with 2 month history of dizziness and concerns of thyrotoxicosis.   Work up has been negative with symptoms related to vertigo and mobility limited due to "lightheadedness".   Would recommend vestibular education and outpatient therapy to help address symptoms.  May want to trial of scopolamine patch to help with symptoms. Will defer CIR consult.

## 2015-02-08 NOTE — Progress Notes (Addendum)
Physical Therapy Treatment Patient Details Name: Monica Chase MRN: [id number] DOB: [birth year] Today's Date: 02/08/2015    History of Present Illness Patient is a 39 y.o. female admitted 02/05/15 with dizziness with palpitations and clamminess.  Patient with near syncope.  PMH:  Hyperthyroidism, asthma, depression, anxiety, vertigo    PT Comments    Pt continues to be very limited with her gait due to symptoms when sitting upright and standing.  She reports that it is a head pressure and she feels faint.  She has no reports of tipping or spinning, no nausea.  She has a very atypical presentation if this is vertigo at all.  HR stable throughout session today in the 70s.  Pt was only able to walk a short distance ~8 feet with RW.  She has ~15 stairs total to enter her apartment and we will need to practice those in the AM.  She would be best suited to have a RW with a seat so that she can sit down wherever she is if she gets too dizzy/faint feeling.  HEP given of x1 exercises to help with her sensitivity to head motion.  I spoke with RN CM and social worker about pt's and my concerns with her going home.    Follow Up Recommendations  Home health PT;Supervision for mobility/[facility] vestibular therapy if able)     Equipment Recommendations  Other (comment);3in1 (PT) (4 wheel RW with a seat, HH aide)    Recommendations for Other Services   NA     Precautions / Restrictions Precautions Precautions: Fall Precaution Comments: dizziness/lightheadedness. Limited upright mobility.     Mobility  Bed Mobility Overal bed mobility: Modified Independent             General bed mobility comments: HOB maximally elevated and pt using railing/moving slowly  Transfers Overall transfer level: Needs assistance Equipment used: Rolling walker (2 wheeled);1 person hand held assist Transfers: Sit to/from Stand Sit to Stand: Min guard Stand pivot transfers: Min guard       General transfer  comment: Min guard assist for safety, verbal cues for safe hand placement.   Ambulation/Gait Ambulation/Gait assistance: Min assist Ambulation Distance (Feet): 8 Feet Assistive device: Rolling walker (2 wheeled) Gait Pattern/deviations: Step-through pattern;Narrow base of support;Shuffle;Decreased stride length Gait velocity: decreased Gait velocity interpretation: <1.8 ft/sec, indicative of risk for recurrent falls General Gait Details: Pt with slow, choppy steps using RW for stability during gait.  Does not seem unsteady on her feet as much as weak.  No trunkal sway noted in sitting or standing.           Balance Overall balance assessment: Needs assistance Sitting-balance support: Feet supported;No upper extremity supported Sitting balance-Leahy Scale: Good     Standing balance support: Single extremity supported;Bilateral upper extremity supported Standing balance-Leahy Scale: Poor Standing balance comment: Pt needs support of RW and min assist from the therapist                    Cognition Arousal/Alertness: Awake/alert Behavior During Therapy: WFL for tasks assessed/performed Overall Cognitive Status: Within Functional Limits for tasks assessed                      Exercises Other Exercises Other Exercises: gave x1 seated exercises since these were symptomatic.      General Comments General comments (skin integrity, edema, etc.): Continued vestibular evaluation not revealing much.  She reports increased head pressure with gaze stability testing (  head shaking both directions, smooth pursuits, saccades), but was able to complete them with good accuracy (slowly).  No signs of nystagmus throughout the session and since her "pressure"  and lightheadedness are constant, I do not believe this is BPPV and did not preform that testing again.       Pertinent Vitals/Pain Pain Assessment: No/denies pain (continued report of head pressure)           PT Goals  (current goals can now be found in the care plan section) Acute Rehab PT Goals Patient Stated Goal: to return to normal Progress towards PT goals: Progressing toward goals    Frequency  Min 4X/week    PT Plan Discharge plan needs to be updated       End of Session Equipment Utilized During Treatment: Gait belt Activity Tolerance: Other (comment) (limited by dizziness/lightheadedness.) Patient left: in chair;with call bell/phone within reach     Time: 1133-1204 PT Time Calculation (min) (ACUTE ONLY): 31 min  Charges:  $Gait Training: 8-22 mins $Self Care/Home Management: 8-22                       Gean Larose B. Jonella Redditt, PT, DPT [phone]   02/08/2015, 1:22 PM

## 2015-02-09 LAB — BASIC METABOLIC PANEL
Anion gap: 8 (ref 5–15)
BUN: 6 mg/dL (ref 6–20)
CALCIUM: 8.7 mg/dL — AB (ref 8.9–10.3)
CO2: 27 mmol/L (ref 22–32)
CREATININE: 0.66 mg/dL (ref 0.44–1.00)
Chloride: 101 mmol/L (ref 101–111)
GFR calc Af Amer: >60 mL/min (ref >60)
GFR calc non Af Amer: >60 mL/min (ref >60)
GLUCOSE: 75 mg/dL (ref 65–99)
Potassium: 3.4 mmol/L — ABNORMAL LOW (ref 3.5–5.1)
Sodium: 136 mmol/L (ref 135–145)

## 2015-02-09 MED ORDER — MIDODRINE HCL 5 MG PO TABS: 5.0000 mg | ORAL_TABLET | Freq: Three times a day (TID) | ORAL | Status: DC

## 2015-02-09 MED ORDER — DOCUSATE SODIUM 100 MG PO CAPS
100.0000 mg | ORAL_CAPSULE | Freq: Two times a day (BID) | ORAL | Status: DC
  Administered 2015-02-09: 100 mg via ORAL
  Filled 2015-02-09: qty 1

## 2015-02-09 MED ORDER — DIAZEPAM 5 MG PO TABS: 5.0000 mg | ORAL_TABLET | Freq: Three times a day (TID) | ORAL | Status: DC | PRN

## 2015-02-09 MED ORDER — MIDODRINE HCL 5 MG PO TABS
5.0000 mg | ORAL_TABLET | Freq: Three times a day (TID) | ORAL | Status: DC
  Administered 2015-02-09 (×2): 5 mg via ORAL
  Filled 2015-02-09 (×2): qty 1

## 2015-02-09 MED ORDER — PROMETHAZINE HCL 12.5 MG PO TABS: 12.5000 mg | ORAL_TABLET | Freq: Four times a day (QID) | ORAL | Status: DC | PRN

## 2015-02-09 MED ORDER — POTASSIUM CHLORIDE CRYS ER 20 MEQ PO TBCR
40.0000 meq | EXTENDED_RELEASE_TABLET | Freq: Once | ORAL | Status: AC
  Administered 2015-02-09: 40 meq via ORAL
  Filled 2015-02-09: qty 2

## 2015-02-09 MED ORDER — MECLIZINE HCL 25 MG PO TABS
25.0000 mg | ORAL_TABLET | Freq: Three times a day (TID) | ORAL | Status: DC
Start: 2015-02-09 — End: 2015-02-25

## 2015-02-09 NOTE — Clinical Social Work Note (Signed)
CSW faxed SCAT application to SCAT office and provided patient with application packet. CSW encouraged Ms. Hannibal to contact [employer]AT on Wednesday, 9/14.    Monica Chase, MSW, LCSW Licensed Clinical Social Worker [place] [phone]

## 2015-02-09 NOTE — Progress Notes (Signed)
Occupational Therapy Treatment Patient Details Name: Monica Chase MRN: [id number] DOB: January 06, 1976 Today's Date: 02/09/2015    History of present illness Patient is a 39 y.o. female admitted 02/05/15 with dizziness with palpitations and clamminess.  Patient with near syncope.  PMH:  Hyperthyroidism, asthma, depression, anxiety, vertigo   OT comments  Focus of session included bed mobility, transfers and UB/LB dressing and UB/LB bathing required for ADL independence. Pt unable to take a shower due to onset of dizziness during sit to stand and ambulation to bathroom. Pt required to seated rest breaks during attempt to ambulate to bathroom. Pt able to sponge bath at sink with supervision and was able to stand for ~1-2 minutes to perform peri-care as well as don undergarments. Pt reports fear of showering at home while standing due to dizziness occurrences.   Follow Up Recommendations       Equipment Recommendations  3 in 1 bedside comode;Tub/shower bench    Recommendations for Other Services      Precautions / Restrictions Precautions Precautions: Fall Precaution Comments: Dizziness and lightheadness with positional change Restrictions Weight Bearing Restrictions: No       Mobility Bed Mobility Overal bed mobility: Needs Assistance Bed Mobility: Supine to Sit     Supine to sit: Supervision     General bed mobility comments: HOB elevated and pt moved slowly   Transfers Overall transfer level: Needs assistance Equipment used: Rolling walker (2 wheeled) Transfers: Sit to/from [employer] Sit to Stand: Supervision   Squat pivot transfers: Supervision          Balance           Standing balance support: Bilateral upper extremity supported Standing balance-Leahy Scale: Poor Standing balance comment: Pt requires support of RW and min guard assist due to onset of dizziness                   ADL Overall ADL's : Needs assistance/impaired          Upper Body Bathing: Supervision/ safety;Set up;Sitting   Lower Body Bathing: Supervison/ safety;Set up;Sit to/from stand Lower Body Bathing Details (indicate cue type and reason): Pt able to stand for ~1-2 minutes for peri-care with no onset of dizziness reported by pt Upper Body Dressing : Supervision/safety;Set up;Sitting   Lower Body Dressing: Supervision/safety;Set up;Sit to/from stand Lower Body Dressing Details (indicate cue type and reason): Pt able to stand ~30 seconds to don under garment with no onset of dizziness reported by pt Toilet Transfer: Set up;Min guard;Squat-pivot;BSC Toilet Transfer Details (indicate cue type and reason): Intial pt became dizzy while trying to ambulate to bathroom , therefore had to use [facility] Toileting- Clothing Manipulation and Hygiene: Supervision/safety;Sit to/from stand Toileting - Clothing Manipulation Details (indicate cue type and reason): Pt performed hygiene while sitting on BSC     Functional mobility during ADLs: Min guard (Pt became dizzy while trying to ambulate to bathroom ) General ADL Comments: Pt instructed to sit to engage in ADLs      Vision                     Perception     Praxis      Cognition   Behavior During Therapy: [facility] for tasks assessed/performed Overall Cognitive Status: Within Functional Limits for tasks assessed                       Extremity/Trunk Assessment  Exercises     Shoulder Instructions       General Comments      Pertinent Vitals/ Pain       Pain Assessment: No/denies pain  Home Living                                          Prior Functioning/Environment              Frequency Min 2X/week     Progress Toward Goals  OT Goals(current goals can now be found in the care plan section)     Acute Rehab OT Goals Patient Stated Goal: to be able to take a shower without experiencing dizziness OT Goal Formulation: With  patient Time For Goal Achievement: 02/14/15 Potential to Achieve Goals: Good ADL Goals Pt Will Perform Lower Body Bathing: sit to/from stand;with set-up;with supervision Pt Will Perform Lower Body Dressing: with set-up;with supervision;sit to/from stand Pt Will Transfer to Toilet: with supervision;ambulating Pt Will Perform Toileting - Clothing Manipulation and hygiene: with supervision;sit to/from stand Additional ADL Goal #1: Pt will independently perform HEP for bilateral UEs to increase strength.  Plan      Co-evaluation                 End of Session Equipment Utilized During Treatment: Gait belt;Rolling walker   Activity Tolerance Patient tolerated treatment well (However, dizziness limited pt from being able to shower )   Patient Left in chair;with call bell/phone within reach   Nurse Communication  (spoke with nurse tech regarding pt sponge bath )        Time: 1423-9532 OT Time Calculation (min): 49 min  Charges: OT General Charges $OT Visit: 1 Procedure OT Treatments $Self Care/Home Management : 38-52 mins  Lin Landsman 02/09/2015, 2:36 PM

## 2015-02-09 NOTE — Progress Notes (Signed)
PT Cancellation Note  Patient Details Name: VALLERI HENDRICKSEN MRN: [id number] DOB: [birth year]   Cancelled Treatment:    Reason Eval/Treat Not Completed: Patient declined, she would like to get washed up before going to practice the stairs.  PT to check back later.  Thanks,    Barbarann Ehlers. Eshawn Coor, [place], DPT [phone]   02/09/2015, 10:15 AM

## 2015-02-09 NOTE — Progress Notes (Signed)
02/09/2015 6:39 PM  Monica Chase Pardon to be D/C'd Home per MD order.  Discussed prescriptions and follow up appointments with the patient. Prescriptions given to patient, medication list explained in detail. Pt verbalized understanding.    Medication List    STOP taking these medications        hydrocortisone 2.5 % lotion     predniSONE 10 MG tablet  Commonly known as:  DELTASONE      TAKE these medications        albuterol 108 (90 BASE) MCG/ACT inhaler  Commonly known as:  PROVENTIL HFA;VENTOLIN HFA  Inhale 2 puffs into the lungs every 6 (six) hours as needed for wheezing.     aspirin 325 MG tablet  Take 325-650 mg by mouth every 6 (six) hours as needed for mild pain.     cholecalciferol 1000 UNITS tablet  Commonly known as:  VITAMIN D  Take 1,000 Units by mouth daily.     diazepam 5 MG tablet  Commonly known as:  VALIUM  Take 1 tablet (5 mg total) by mouth every 8 (eight) hours as needed for anxiety.     fluticasone 50 MCG/ACT nasal spray  Commonly known as:  FLONASE  Place 2 sprays into both nostrils daily.     FUSION PLUS Caps  Take 1 capsule by mouth daily.     ibuprofen 200 MG tablet  Commonly known as:  ADVIL,MOTRIN  Take 400 mg by mouth daily as needed for moderate pain.     ipratropium 0.06 % nasal spray  Commonly known as:  ATROVENT  Place 2 sprays into both nostrils 4 (four) times daily. For nasal congestion     meclizine 25 MG tablet  Commonly known as:  ANTIVERT  Take 1 tablet (25 mg total) by mouth 3 (three) times daily.     methimazole 5 MG tablet  Commonly known as:  TAPAZOLE  Take 5 mg by mouth 3 (three) times daily.     midodrine 5 MG tablet  Commonly known as:  PROAMATINE  Take 1 tablet (5 mg total) by mouth 3 (three) times daily with meals.     montelukast 10 MG tablet  Commonly known as:  SINGULAIR  Take 1 tablet (10 mg total) by mouth at bedtime.     multivitamin tablet  Take 1 tablet by mouth daily.     promethazine 12.5 MG  tablet  Commonly known as:  PHENERGAN  Take 1 tablet (12.5 mg total) by mouth every 6 (six) hours as needed (dizziness not responding to vertigo).     ranitidine 150 MG capsule  Commonly known as:  ZANTAC  Take 1 capsule (150 mg total) by mouth 2 (two) times daily.        Filed Vitals:   02/09/15 1110  BP: 108/64  Pulse: 4  Temp:   Resp:    Skin clean, dry and intact without evidence of skin break down, no evidence of skin tears noted. IV catheter discontinued intact. Site without signs and symptoms of complications. Dressing and pressure applied. Pt denies pain at this time. No complaints noted.  An After Visit Summary was printed and given to the patient. Patient escorted via [place], and D/C home via private auto.  [employer], RN-BC, [employer] [facility] Phone [phone]

## 2015-02-09 NOTE — Clinical Social Work Note (Addendum)
CSW, nurse case manager and CSW intern spoke with patient regarding discharge planning. [name] was informed that a skilled nursing facility is not needed as PT recommended Home Health PT. Patient was concerned about her prescriptions, durable medical equipment, transportation and her application for Medicaid. After CSW, Nurse Case Manager and [place] Intern visited patient again, she requested a SCAT application for assistance with transportation. The patient also requested information on the [employer]. [name] requested a [place] letter for assistance with prescriptions and this was provided to her by nurse case manager. CSW called and left a message with financial counselor regarding patient's Medicaid application.   Jolan E. Jimmye Norman, [place] Intern   CSW Intern's note reviewed and approved as written.  Aurelia Gras Givens, MSW, LCSW Licensed Clinical Social Worker [place] [phone]

## 2015-02-09 NOTE — Care Management Note (Addendum)
Case Management Note  Patient Details  Name: Monica Chase MRN: [id number] Date of Birth: 29-Oct-1975  Subjective/Objective:             CM following for progress and d/c planning.       Action/Plan: 02/09/2015 Notified by RN and PT that this pt has multiple issues with ambulation and completion of ADLs due to dizziness and drop in BP with standing. This pt is 39 yr old and is asking for "placement". Discussed with CSW, V. Sharlet [place] and "placement" seems unlikely due to pt age, lack of a payor source and custodial needs only. Pt shows Medicaid pending and she states that she has had this benefit before but is currently not receiving this benefit.  Pt has a child approx 22 yr old , who may be able to assist at home, a sister, who works and an aunt. If her sister and Elenor Legato are available to check in and to assist with shopping and meal preparation, the child should be able to take care of her own needs.  I will ask [facility] to eval this pt for possible [facility] assistance with DME and HHRN. Will continue to follow.   Expected Discharge Date:       02/09/2015           Expected Discharge Plan:  [place]  In-House Referral:  Clinical Social Work  Discharge planning Services  [place], [facility] letter given to pt to assist with meds, DME to be delivered to room by [facility]. Followup appointment scheduled at [place] for Tue Sept 20, 2016 @ 11:30am. Pt given information and is able to repeat information re [facility] letter and followup appointment.   Post Acute Care Choice:  Durable Medical Equipment Choice offered to:     DME Arranged:  3-N-1, Walker rolling with seat DME Agency:  [place]:  RN [facility] [place] Agency:  [place]  Status of Service:  In process, will continue to follow  Medicare Important Message Given:    Date Medicare IM Given:    Medicare IM give by:    Date Additional Medicare IM Given:    Additional Medicare  Important Message give by:     If discussed at [place] of Stay Meetings, dates discussed:    Additional Comments:  Adron Bene, RN 02/09/2015, 11:13 AM

## 2015-02-09 NOTE — Discharge Summary (Signed)
[facility]  Physician Discharge Summary   Patient ID: [name] MRN: [id number] DOB/AGE: [birth year] 39 y.o.  Admit date: 02/05/2015 Discharge date: 02/09/2015  PCP: No primary care provider on file.  DISCHARGE DIAGNOSES:  Active Problems:   Thyrotoxicosis   Near syncope   Headache   RECOMMENDATIONS FOR OUTPATIENT FOLLOW UP: 1. Outpatient referral to neurology 2. Case management to try getting her home health  DISCHARGE CONDITION: fair  Diet recommendation: Regular  Filed Weights   02/05/15 1001 02/05/15 2100 02/08/15 2056  Weight: 89.359 kg (197 lb) 85.73 kg (189 lb) 89.641 kg (197 lb 10 oz)    INITIAL HISTORY: 39 year old African-American female with a past medical history of hyperthyroidism, asthma, presented with complaints of dizziness and near syncope. Patient was very nonspecific with regards to her symptoms. She was noted to have very unsteady gait in the emergency department. She was subsequently hospitalized for further management.   Procedures:  Echocardiogram Study Conclusions - Left ventricle: The cavity size was normal. Systolic function wasnormal. The estimated ejection fraction was in the range of 55%to 60%. Wall motion was normal; there were no regional wallmotion abnormalities. Left ventricular diastolic functionparameters were normal.  HOSPITAL COURSE:   Near syncope with headache and palpitations/vertigo/?Orthostatic Etiology remains unclear for the most part. Continues to have symptoms. Workup has been negative so far including MRI brain and echocardiogram. Flow was noted in all the intracranial vessels on the MRI. Symptoms were somewhat suggestive of vertigo. She was started on meclizine. She did not show much improvement. Valium was added. She was seen by physical and occupational therapy. Some drop in blood pressure with elevation in heart rate was noted when she went from a sitting to standing position. Hence, midodrine was  initiated. He does not have any neurological deficits. No evidence for neuropathy on examination. Review of her chart suggests that the symptoms have been ongoing for a few months. She was also given IV fluids. Her symptoms were discussed in detail with the patient and her aunt. Patient is very scared about going back home due to safety reasons. I have offered her consult with social worker for placement. They also raised possibility of patient being transferred to another facility. I have explained to them there is no real medical acuity at this time that needs transfer to a higher level of care. Have told them that this problem will need continued outpatient evaluation. Hence a referral has been sent to neurology. B-12 level was normal. She doesn't need further inpatient workup. Case manager and social worker to discuss with patient regarding disposition.  History of hyperthyroidism Thyroid function tests were normal at the time of admission. She may resume her methimazole as it's unlikely that medicine is causing symptoms. She should follow-up with her endocrinologist  History of Asthma Stable. Continue home medications  History of PTSD and anxiety Stable.   Disposition Will be the main challenge in this patient. She is reluctant to go back home. Case management and social worker to discuss further with patient. From a medical standpoint, she appears to be okay for discharge.   PERTINENT LABS:  The results of significant diagnostics from this hospitalization (including imaging, microbiology, ancillary and laboratory) are listed below for reference.      Labs: Basic Metabolic Panel:  Recent Labs Lab 02/05/15 1055 02/06/15 0519 02/09/15 0505  NA 136 141 136  K 3.7 4.1 3.4*  CL 100* 108 101  CO2 27 27 27   GLUCOSE 81 81 75  BUN  10 11 6   CREATININE 0.75 0.78 0.66  CALCIUM 9.2 8.8* 8.7*   Liver Function Tests:  Recent Labs Lab 02/06/15 0519  AST 19  ALT 10*  ALKPHOS 42    BILITOT 0.4  PROT 6.5  ALBUMIN 3.1*   CBC:  Recent Labs Lab 02/05/15 1055 02/06/15 0519  WBC 5.7 5.9  HGB 14.7 12.8  HCT 42.5 37.6  MCV 89.5 91.7  PLT 342 313    CBG:  Recent Labs Lab 02/05/15 1039  GLUCAP 71     IMAGING STUDIES Dg Chest 2 View  02/05/2015   CLINICAL DATA:  Dizziness  EXAM: CHEST  2 VIEW  COMPARISON:  08/09/2004  FINDINGS: Suboptimal radiographic technique in the upper lobes cannot be evaluated.  Heart size upper normal. Limited evaluation of the mid to lower lung zones reveals no definite infiltrate. No pleural effusion.  IMPRESSION: No acute findings but the PA view should be repeated as it is not of diagnostic quality.   Electronically Signed   By: Skipper Cliche M.D.   On: 02/05/2015 19:33   Mr Jeri Cos FX Contrast  02/05/2015   CLINICAL DATA:  Headache and dizziness.  EXAM: MRI HEAD WITHOUT AND WITH CONTRAST  TECHNIQUE: Multiplanar, multiecho pulse sequences of the brain and surrounding structures were obtained without and with intravenous contrast.  CONTRAST:  23mL MULTIHANCE GADOBENATE DIMEGLUMINE 529 MG/ML IV SOLN  COMPARISON:  None.  FINDINGS: The diffusion-weighted images demonstrate no evidence for acute or subacute infarction. Acute hemorrhage or mass lesion is present. The ventricles are of normal size. No significant white matter disease is present. This basal ganglia and brainstem are within normal limits.  Flow is present in the major intracranial arteries. The globes and orbits are intact. A polyp or mucous retention cyst is present in the right maxillary sinus. The remaining paranasal sinuses and the mastoid air cells are clear.  Skullbase is within normal limits. Midline structures are unremarkable.  IMPRESSION: 1. Normal MRI appearance the brain. 2. Small polyp or mucous retention cyst posteriorly in the right maxillary sinus.   Electronically Signed   By: San Morelle M.D.   On: 02/05/2015 20:45    DISCHARGE EXAMINATION: Filed Vitals:    02/09/15 0607 02/09/15 0613 02/09/15 0919 02/09/15 1110  BP: 92/40 101/54 105/43 108/64  Pulse: 62  83 4  Temp: 98 F (36.7 C)  98.1 F (36.7 C)   TempSrc: Oral  Oral   Resp: 16  18   Height:      Weight:      SpO2: 100%  99%    General appearance: alert, cooperative, appears stated age and no distress Resp: clear to auscultation bilaterally Cardio: regular rate and rhythm, S1, S2 normal, no murmur, click, rub or gallop GI: soft, non-tender; bowel sounds normal; no masses,  no organomegaly Extremities: extremities normal, atraumatic, no cyanosis or edema  DISPOSITION: Most likely home with home health  Discharge Instructions    Ambulatory referral to Neurology    Complete by:  As directed   An appointment is requested in approximately: 2 weeks. For persistent lightheadedness.     Call MD for:  difficulty breathing, headache or visual disturbances    Complete by:  As directed      Call MD for:  extreme fatigue    Complete by:  As directed      Call MD for:  persistant dizziness or light-headedness    Complete by:  As directed      Call  MD for:  persistant nausea and vomiting    Complete by:  As directed      Call MD for:  severe uncontrolled pain    Complete by:  As directed      Call MD for:  temperature >100.4    Complete by:  As directed      Diet general    Complete by:  As directed      Discharge instructions    Complete by:  As directed   An outpatient referral has been sent to neurology to further evaluate your symptoms.Marland Kitchen He should also follow-up with her outpatient endocrinologist.  You were cared for by a hospitalist during your hospital stay. If you have any questions about your discharge medications or the care you received while you were in the hospital after you are discharged, you can call the unit and asked to speak with the hospitalist on call if the hospitalist that took care of you is not available. Once you are discharged, your primary care physician will  handle any further medical issues. Please note that NO REFILLS for any discharge medications will be authorized once you are discharged, as it is imperative that you return to your primary care physician (or establish a relationship with a primary care physician if you do not have one) for your aftercare needs so that they can reassess your need for medications and monitor your lab values. If you do not have a primary care physician, you can call [phone] for a physician referral.     Increase activity slowly    Complete by:  As directed            ALLERGIES:  Allergies  Allergen Reactions  . Penicillins Other (See Comments)    Reaction unknown     Current Discharge Medication List    START taking these medications   Details  diazepam (VALIUM) 5 MG tablet Take 1 tablet (5 mg total) by mouth every 8 (eight) hours as needed for anxiety. Qty: 30 tablet, Refills: 0    midodrine (PROAMATINE) 5 MG tablet Take 1 tablet (5 mg total) by mouth 3 (three) times daily with meals. Qty: 90 tablet, Refills: 0    promethazine (PHENERGAN) 12.5 MG tablet Take 1 tablet (12.5 mg total) by mouth every 6 (six) hours as needed (dizziness not responding to vertigo). Qty: 30 tablet, Refills: 0      CONTINUE these medications which have CHANGED   Details  meclizine (ANTIVERT) 25 MG tablet Take 1 tablet (25 mg total) by mouth 3 (three) times daily. Qty: 60 tablet, Refills: 0      CONTINUE these medications which have NOT CHANGED   Details  albuterol (PROVENTIL HFA;VENTOLIN HFA) 108 (90 BASE) MCG/ACT inhaler Inhale 2 puffs into the lungs every 6 (six) hours as needed for wheezing. Qty: 1 Inhaler, Refills: 0   Associated Diagnoses: Asthma, mild intermittent, uncomplicated    aspirin 793 MG tablet Take 325-650 mg by mouth every 6 (six) hours as needed for mild pain.    cholecalciferol (VITAMIN D) 1000 UNITS tablet Take 1,000 Units by mouth daily.     fluticasone (FLONASE) 50 MCG/ACT nasal spray Place  2 sprays into both nostrils daily. Qty: 16 g, Refills: 2    ibuprofen (ADVIL,MOTRIN) 200 MG tablet Take 400 mg by mouth daily as needed for moderate pain.     ipratropium (ATROVENT) 0.06 % nasal spray Place 2 sprays into both nostrils 4 (four) times daily. For nasal congestion Qty: 15  mL, Refills: 0    Iron-FA-B Cmp-C-Biot-Probiotic (FUSION PLUS) CAPS Take 1 capsule by mouth daily.  Refills: 0    methimazole (TAPAZOLE) 5 MG tablet Take 5 mg by mouth 3 (three) times daily. Refills: 0    Multiple Vitamins-Minerals (MULTIVITAMIN) tablet Take 1 tablet by mouth daily. 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 Qty: 30 tablet, Refills: 0    ranitidine (ZANTAC) 150 MG capsule Take 1 capsule (150 mg total) by mouth 2 (two) times daily. Qty: 60 capsule, Refills: 1      STOP taking these medications     hydrocortisone 2.5 % lotion      predniSONE (DELTASONE) 10 MG tablet        Follow-up Information    Follow up with Renato Shin, MD. Schedule an appointment as soon as possible for a visit in 2 weeks.   Specialty:  Endocrinology   Why:  post hospitalization follow up   Contact information:   [address] [place] [address] [phone]       TOTAL DISCHARGE TIME: 35 minutes  [facility]  [facility] Pager [phone]  02/09/2015, 1:15 PM

## 2015-02-09 NOTE — Progress Notes (Addendum)
Physical Therapy Treatment Patient Details Name: Monica Chase MRN: [id number] DOB: 13-Jul-1975 Today's Date: 02/09/2015    History of Present Illness Patient is a 39 y.o. female admitted 02/05/15 with dizziness with palpitations and clamminess.  Patient with near syncope.  PMH:  Hyperthyroidism, asthma, depression, anxiety, vertigo    PT Comments    Pt continues to report the same intensity of symptoms.  They seem to be manageable as long as she can stay seated and stand for short bursts of time.  She was able (by scooting on her bottom) to get up and down a full flight of stairs.  This is not the ideal way for her to get into and out of her home, but may have to be how she can get home as it is safer for her to be sitting.  PT discussed at length safety measures and equipment recommendations, and RN CM working on seeing if she can at least get equipment needed to be safe.  Pt would benefit from home therapy f/u, but I do not know if she will qualify, so HEP give yesterday.  PT will continue to follow acutely.  Home is not the ideal setting for her ability level (CIR-which has been denied, or SNF would be best, but I am not sure of her options given her lack of insurance- CSW and RN CM continue to work on her d/c needs).    Follow Up Recommendations  Home health PT;Supervision for mobility/OOB (vestibular rehab if able at home)     Equipment Recommendations  3in1 (PT);Other (comment) (4 wheeled RW with seat)    Recommendations for Other Services   NA     Precautions / Restrictions Precautions Precautions: Fall Precaution Comments: Dizziness and lightheadness with positional change    Mobility  Bed Mobility               General bed mobility comments: Pt already OOB in the chair  Transfers Overall transfer level: Needs assistance Equipment used: 2 person hand held assist Transfers: Sit to/from Stand Sit to Stand: Min guard         General transfer comment: Min guard  assist to help pt stand and get a hold of the railing for stair training.   Ambulation/Gait             General Gait Details: Pt attempted some ambulation with OT, not very successful.  Our focus today was stairs, so gait was not preformed.    Stairs Stairs: Yes Stairs assistance: Min assist Stair Management: One rail Right;Step to pattern;Forwards Number of Stairs: 3 General stair comments: Started in standing going up three steps with both hands on railing, pt needed min assist at trunk for safety.  Pt was unable to stand and go further, so we sat down on the third step and scooted on her bottom all the way up and back down the steps.  We talked about her having to stand up and do one or two steps to make it easier to get up and down from the top/bottom of the steps vs the ground.  Family to carry her rollator for her.           Balance Overall balance assessment: Needs assistance Sitting-balance support: Feet supported;No upper extremity supported Sitting balance-Leahy Scale: Good     Standing balance support: Bilateral upper extremity supported Standing balance-Leahy Scale: Poor  Cognition Arousal/Alertness: Awake/alert Behavior During Therapy: WFL for tasks assessed/performed Overall Cognitive Status: Within Functional Limits for tasks assessed                         General Comments General comments (skin integrity, edema, etc.): Pt continues to report more weakness than she does "spinning" that I would expect with a true vestibular dysfunction.  She did have several boughts of "dizziness" in the stairwell while looking down the stairs and having her focus on targets in front of her vs looking down seemed to help.        Pertinent Vitals/Pain Pain Assessment: No/denies pain (pt did not report any head pressure today during our session)           PT Goals (current goals can now be found in the care plan section) Acute Rehab  PT Goals Patient Stated Goal: to return to normal Progress towards PT goals: Progressing toward goals    Frequency  Min 4X/week    PT Plan Current plan remains appropriate       End of Session Equipment Utilized During Treatment: Gait belt Activity Tolerance: Other (comment) (limited by weakness/dizziness. ) Patient left: in chair;with call bell/phone within reach     Time: 1201-1249 PT Time Calculation (min) (ACUTE ONLY): 48 min  Charges:  $Gait Training: 23-37 mins $Self Care/Home Management: 8-22                     Murat Rideout B. Kyi Romanello, PT, DPT [phone]   02/09/2015, 5:05 PM

## 2015-02-16 ENCOUNTER — Telehealth: Payer: Self-pay | Admitting: Endocrinology

## 2015-02-16 ENCOUNTER — Ambulatory Visit: Payer: Medicaid Other | Admitting: Endocrinology

## 2015-02-16 ENCOUNTER — Telehealth: Payer: Self-pay | Admitting: Family Medicine

## 2015-02-16 ENCOUNTER — Ambulatory Visit: Payer: Medicaid Other | Attending: Family Medicine | Admitting: Family Medicine

## 2015-02-16 ENCOUNTER — Ambulatory Visit (INDEPENDENT_AMBULATORY_CARE_PROVIDER_SITE_OTHER): Payer: Medicaid Other | Admitting: Endocrinology

## 2015-02-16 ENCOUNTER — Encounter: Payer: Self-pay | Admitting: Family Medicine

## 2015-02-16 ENCOUNTER — Encounter: Payer: Self-pay | Admitting: Endocrinology

## 2015-02-16 VITALS — BP 117/79 | HR 73 | Temp 98.3°F | Ht 71.0 in | Wt 210.0 lb

## 2015-02-16 VITALS — BP 118/80 | HR 70 | Temp 98.3°F | Ht 71.0 in | Wt 210.0 lb

## 2015-02-16 DIAGNOSIS — H8113 Benign paroxysmal vertigo, bilateral: Secondary | ICD-10-CM | POA: Diagnosis not present

## 2015-02-16 DIAGNOSIS — R55 Syncope and collapse: Secondary | ICD-10-CM | POA: Diagnosis not present

## 2015-02-16 DIAGNOSIS — Z79899 Other long term (current) drug therapy: Secondary | ICD-10-CM | POA: Diagnosis not present

## 2015-02-16 DIAGNOSIS — E0590 Thyrotoxicosis, unspecified without thyrotoxic crisis or storm: Secondary | ICD-10-CM | POA: Insufficient documentation

## 2015-02-16 DIAGNOSIS — Z7982 Long term (current) use of aspirin: Secondary | ICD-10-CM | POA: Diagnosis not present

## 2015-02-16 DIAGNOSIS — R42 Dizziness and giddiness: Secondary | ICD-10-CM | POA: Insufficient documentation

## 2015-02-16 DIAGNOSIS — H8110 Benign paroxysmal vertigo, unspecified ear: Secondary | ICD-10-CM | POA: Diagnosis not present

## 2015-02-16 DIAGNOSIS — R5383 Other fatigue: Secondary | ICD-10-CM | POA: Insufficient documentation

## 2015-02-16 DIAGNOSIS — I951 Orthostatic hypotension: Secondary | ICD-10-CM | POA: Diagnosis not present

## 2015-02-16 LAB — RHEUMATOID FACTOR

## 2015-02-16 LAB — CK: CK TOTAL: 78 U/L (ref 7–177)

## 2015-02-16 MED ORDER — PROPYLTHIOURACIL 50 MG PO TABS
50.0000 mg | ORAL_TABLET | Freq: Every day | ORAL | Status: DC
Start: 2015-02-16 — End: 2015-06-27

## 2015-02-16 NOTE — Telephone Encounter (Signed)
Error

## 2015-02-16 NOTE — Progress Notes (Signed)
Subjective:    Patient ID: Monica Chase, female    DOB: 1975/09/03, 39 y.o.   MRN: [id number]  HPI Pt returns for f/u of hyperthyroidism (dx'ed 2008; it was first dx'ed during a pregnancy; she chose rx with tapazole; she has never had thyroid imaging; she has had TAH).  She was recently in the hospital with excessive diaphoresis and dizziness.  She feels somewhat better now.  She wants to change to PTU, as she feels tapazole is causing her sxs.   Past Medical History  Diagnosis Date  . HYPERTHYROIDISM 07/20/2009  . DEPRESSION, MAJOR, RECURRENT 07/26/2006  . ANXIETY 07/26/2006  . ASTHMA, INTERMITTENT 07/26/2006  . HYPERGLYCEMIA 09/28/2009  . CONSTIPATION, CHRONIC 06/22/2010  . Dizziness 11/09/2010  . OBESITY 01/31/2008  . Fibroid     Past Surgical History  Procedure Laterality Date  . Tubal ligation  2008  . Iud removal  10/07    Mirena removed 05/10/2004  . Pilonidal cyst excision    . Vaginal delivery      X 4  . Vaginal hysterectomy N/A 07/07/2013    Procedure: HYSTERECTOMY VAGINAL;  Surgeon: Donnamae Jude, MD;  Location: [facility];  Service: Gynecology;  Laterality: N/A;  . Bilateral salpingectomy Bilateral 07/07/2013    Procedure: BILATERAL SALPINGECTOMY;  Surgeon: Donnamae Jude, MD;  Location: [facility];  Service: Gynecology;  Laterality: Bilateral;    Social History   Social History  . Marital Status: Divorced    Spouse Name: N/A  . Number of Children: N/A  . Years of Education: N/A   Occupational History  . Not on file.   Social History Main Topics  . Smoking status: Former Smoker    Quit date: 11/08/2005  . Smokeless tobacco: Never Used  . Alcohol Use: No  . Drug Use: No  . Sexual Activity: Not Currently    Birth Control/ Protection: Surgical   Other Topics Concern  . Not on file   Social History Narrative   single    Current Outpatient Prescriptions on File Prior to Visit  Medication Sig Dispense Refill  . albuterol (PROVENTIL HFA;VENTOLIN HFA) 108 (90 BASE)  MCG/ACT inhaler Inhale 2 puffs into the lungs every 6 (six) hours as needed for wheezing. 1 Inhaler 0  . cholecalciferol (VITAMIN D) 1000 UNITS tablet Take 1,000 Units by mouth daily.     . meclizine (ANTIVERT) 25 MG tablet Take 1 tablet (25 mg total) by mouth 3 (three) times daily. 60 tablet 0  . midodrine (PROAMATINE) 5 MG tablet Take 1 tablet (5 mg total) by mouth 3 (three) times daily with meals. 90 tablet 0  . montelukast (SINGULAIR) 10 MG tablet Take 1 tablet (10 mg total) by mouth at bedtime. (Patient not taking: Reported on 02/05/2015) 30 tablet 0  . Multiple Vitamins-Minerals (MULTIVITAMIN) tablet Take 1 tablet by mouth daily. (Patient not taking: Reported on 02/16/2015) 90 tablet 3  . promethazine (PHENERGAN) 12.5 MG tablet Take 1 tablet (12.5 mg total) by mouth every 6 (six) hours as needed (dizziness not responding to vertigo). (Patient not taking: Reported on 02/16/2015) 30 tablet 0  . [DISCONTINUED] levocetirizine (XYZAL) 5 MG tablet Take 1 tablet (5 mg total) by mouth every evening. (Patient not taking: Reported on 10/13/2014) 30 tablet 0   No current facility-administered medications on file prior to visit.    Allergies  Allergen Reactions  . Penicillins Other (See Comments)    Reaction unknown    Family History  Problem Relation Age of Onset  .  Hypertension Other     BP 118/80 mmHg  Pulse 70  Temp(Src) 98.3 F (36.8 C) (Oral)  Ht 5\' 11"  (1.803 m)  Wt 210 lb (95.255 kg)  BMI 29.30 kg/m2  SpO2 97%  LMP 06/17/2013   Review of Systems Denies fever.      Objective:   Physical Exam VITAL SIGNS:  See vs page GENERAL: no distress NECK: There is no palpable thyroid enlargement.  No thyroid nodule is palpable.  No palpable lymphadenopathy at the anterior neck. Skin: not diaphoretic.  Neuro: no tremor  Lab Results  Component Value Date   TSH 2.150 02/05/2015       Assessment & Plan:  Hyperthyroidism, well-controlled, but she requests different rx.  Patient is  advised the following: Patient Instructions  i have sent a prescription to your pharmacy, to change methimazole to a different thyroid pill. Please come back for a follow-up appointment in 2 months.  if ever you have fever while taking propylthiouracil, stop it and call us, because of the risk of a rare side-effect.

## 2015-02-16 NOTE — Patient Instructions (Signed)
Near-Syncope Near-syncope (commonly known as near fainting) is sudden weakness, dizziness, or feeling like you might pass out. During an episode of near-syncope, you may also develop pale skin, have tunnel vision, or feel sick to your stomach (nauseous). Near-syncope may occur when getting up after sitting or while standing for a long time. It is caused by a sudden decrease in blood flow to the brain. This decrease can result from various causes or triggers, most of which are not serious. However, because near-syncope can sometimes be a sign of something serious, a medical evaluation is required. The specific cause is often not determined. HOME CARE INSTRUCTIONS  Monitor your condition for any changes. The following actions may help to alleviate any discomfort you are experiencing:  Have someone stay with you until you feel stable.  Lie down right away and prop your feet up if you start feeling like you might faint. Breathe deeply and steadily. Wait until all the symptoms have passed. Most of these episodes last only a few minutes. You may feel tired for several hours.   Drink enough fluids to keep your urine clear or pale yellow.   If you are taking blood pressure or heart medicine, get up slowly when seated or lying down. Take several minutes to sit and then stand. This can reduce dizziness.  Follow up with your health care provider as directed. SEEK IMMEDIATE MEDICAL CARE IF:   You have a severe headache.   You have unusual pain in the chest, abdomen, or back.   You are bleeding from the mouth or rectum, or you have black or tarry stool.   You have an irregular or very fast heartbeat.   You have repeated fainting or have seizure-like jerking during an episode.   You faint when sitting or lying down.   You have confusion.   You have difficulty walking.   You have severe weakness.   You have vision problems.  MAKE SURE YOU:   Understand these instructions.  Will  watch your condition.  Will get help right away if you are not doing well or get worse. Document Released: 05/15/2005 Document Revised: 05/20/2013 Document Reviewed: 10/18/2012 Exit[employer] Patient Information 2015 [employer]. This information is not intended to replace advice given to you by your health care provider. Make sure you discuss any questions you have with your health care provider.  

## 2015-02-16 NOTE — Progress Notes (Signed)
Patient recently hospitalized for near syncope and dizziness She states she sees an endocrinologist for her hyper/hypo thyroidism She was taking methimazole and noticed her symptoms started shortly after starting it She quit taking it a couple of weeks ago and saw her endocrinologist today who switched her to PTU 50 mg daily She is still dizzy and states she has to use a walker most of the time to walk She states in the hospital they told her she was positive for orthostatic hypotension She also feels sleepy all the time and she thinks that is from the meclizine

## 2015-02-16 NOTE — Telephone Encounter (Signed)
Patient called requesting for provider to call her back. Patient wants for provider to fax some information to [facility]. Please f/u with pt.

## 2015-02-16 NOTE — Patient Instructions (Addendum)
i have sent a prescription to your pharmacy, to change methimazole to a different thyroid pill. Please come back for a follow-up appointment in 2 months.  if ever you have fever while taking propylthiouracil, stop it and call us, because of the risk of a rare side-effect.

## 2015-02-17 ENCOUNTER — Ambulatory Visit (HOSPITAL_COMMUNITY): Payer: MEDICAID

## 2015-02-17 LAB — SEDIMENTATION RATE: Sed Rate: 4 mm/hr (ref 0–20)

## 2015-02-17 LAB — ANA: Anti Nuclear Antibody(ANA): NEGATIVE

## 2015-02-17 NOTE — Progress Notes (Signed)
Subjective:  Patient ID: Monica Chase, female    DOB: Mar 20, 1976  Age: 39 y.o. MRN: [id number]  CC: Establish Care and Dizziness   HPI Monica Chase is a 39 year old female with a history of hyperthyroidism who comes in today for follow-up on hospitalization from 02/05/15-02/09/15 at [facility] after she had presented with dizziness and near syncope.  She was admitted and commenced on IV fluids. Examination revealed orthostatic hypotension and so midodrine was initiated and meclizine as well due to symptoms suggestive of vertigo. Workup included labs which were stable and negative for anemia, MRI of the brain which came back normal and 2-D echo which revealed LVEF of 55-60%, no regional wall motion abnormalities, no systolic or diastolic dysfunction. She was reluctant to for discharge when workup was unrevealing and was requesting transfer to a different facility which was not possible as she was thought to be stable from a medical standpoint.   Interval history: She complains of dizziness and feeling weak and about to fall whenever she stands up; denies spinning of the room and denies tinnitus. She says she feels faint and weak and this is also experience when she tries to comb her hair. Symptoms have been going on for a couple of months now and she works as a Training and development officer at [employer] and has been out of work for a while. She associated symptoms with the onset of commencement of methimazole which endocrinologist held for a while but symptoms persisted.  She denies seizure-like activity.  She now ambulates with the aid of a walker.   Outpatient Prescriptions Prior to Visit  Medication Sig Dispense Refill  . albuterol (PROVENTIL HFA;VENTOLIN HFA) 108 (90 BASE) MCG/ACT inhaler Inhale 2 puffs into the lungs every 6 (six) hours as needed for wheezing. 1 Inhaler 0  . cholecalciferol (VITAMIN D) 1000 UNITS tablet Take 1,000 Units by mouth daily.     Marland Kitchen propylthiouracil (PTU) 50 MG  tablet Take 1 tablet (50 mg total) by mouth daily. 30 tablet 3  . meclizine (ANTIVERT) 25 MG tablet Take 1 tablet (25 mg total) by mouth 3 (three) times daily. 60 tablet 0  . midodrine (PROAMATINE) 5 MG tablet Take 1 tablet (5 mg total) by mouth 3 (three) times daily with meals. 90 tablet 0  . montelukast (SINGULAIR) 10 MG tablet Take 1 tablet (10 mg total) by mouth at bedtime. (Patient not taking: Reported on 02/05/2015) 30 tablet 0  . Multiple Vitamins-Minerals (MULTIVITAMIN) tablet Take 1 tablet by mouth daily. (Patient not taking: Reported on 02/16/2015) 90 tablet 3  . promethazine (PHENERGAN) 12.5 MG tablet Take 1 tablet (12.5 mg total) by mouth every 6 (six) hours as needed (dizziness not responding to vertigo). (Patient not taking: Reported on 02/16/2015) 30 tablet 0  . aspirin 325 MG tablet Take 325-650 mg by mouth every 6 (six) hours as needed for mild pain.    . diazepam (VALIUM) 5 MG tablet Take 1 tablet (5 mg total) by mouth every 8 (eight) hours as needed for anxiety. (Patient not taking: Reported on 02/16/2015) 30 tablet 0  . fluticasone (FLONASE) 50 MCG/ACT nasal spray Place 2 sprays into both nostrils daily. (Patient not taking: Reported on 02/16/2015) 16 g 2  . ibuprofen (ADVIL,MOTRIN) 200 MG tablet Take 400 mg by mouth daily as needed for moderate pain.     Marland Kitchen ipratropium (ATROVENT) 0.06 % nasal spray Place 2 sprays into both nostrils 4 (four) times daily. For nasal congestion (Patient not taking: Reported  on 02/16/2015) 15 mL 0  . Iron-FA-B Cmp-C-Biot-Probiotic (FUSION PLUS) CAPS Take 1 capsule by mouth daily.   0  . ranitidine (ZANTAC) 150 MG capsule Take 1 capsule (150 mg total) by mouth 2 (two) times daily. (Patient not taking: Reported on 02/05/2015) 60 capsule 1   No facility-administered medications prior to visit.    ROS Review of Systems  Constitutional: Positive for fatigue. Negative for activity change and appetite change.  HENT: Negative for congestion, sinus pressure and sore  throat.   Eyes: Negative for visual disturbance.  Respiratory: Negative for cough, chest tightness, shortness of breath and wheezing.   Cardiovascular: Negative for chest pain and palpitations.  Gastrointestinal: Negative for abdominal pain, constipation and abdominal distention.  Endocrine: Negative for polydipsia.  Genitourinary: Negative for dysuria and frequency.  Musculoskeletal: Negative for back pain and arthralgias.  Skin: Negative for rash.  Neurological: Positive for weakness and light-headedness. Negative for tremors and numbness.  Hematological: Does not bruise/bleed easily.  Psychiatric/Behavioral: Negative for behavioral problems and agitation.    Objective:  BP 117/79 mmHg  Pulse 73  Temp(Src) 98.3 F (36.8 C)  Ht 5\' 11"  (1.803 m)  Wt 210 lb (95.255 kg)  BMI 29.30 kg/m2  SpO2 98%  LMP 06/17/2013  BP/Weight 02/16/2015 02/16/2015 3/97/6734  Systolic BP 193 790 240  Diastolic BP 80 79 64  Wt. (Lbs) 210 210 -  BMI 29.3 29.3 -    Lab Results  Component Value Date   WBC 5.9 02/06/2015   HGB 12.8 02/06/2015   HCT 37.6 02/06/2015   PLT 313 02/06/2015   GLUCOSE 75 02/09/2015   CHOL 168 10/09/2013   TRIG 73 10/09/2013   HDL 53 10/09/2013   LDLCALC 100* 10/09/2013   ALT 10* 02/06/2015   AST 19 02/06/2015   NA 136 02/09/2015   K 3.4* 02/09/2015   CL 101 02/09/2015   CREATININE 0.66 02/09/2015   BUN 6 02/09/2015   CO2 27 02/09/2015   TSH 2.150 02/05/2015   HGBA1C 5.0 11/09/2010   Dg Chest 2 View  02/05/2015   CLINICAL DATA:  Dizziness  EXAM: CHEST  2 VIEW  COMPARISON:  08/09/2004  FINDINGS: Suboptimal radiographic technique in the upper lobes cannot be evaluated.  Heart size upper normal. Limited evaluation of the mid to lower lung zones reveals no definite infiltrate. No pleural effusion.  IMPRESSION: No acute findings but the PA view should be repeated as it is not of diagnostic quality.   Electronically Signed   By: Skipper Cliche M.D.   On: 02/05/2015 19:33     Mr Jeri Cos XB Contrast  02/05/2015   CLINICAL DATA:  Headache and dizziness.  EXAM: MRI HEAD WITHOUT AND WITH CONTRAST  TECHNIQUE: Multiplanar, multiecho pulse sequences of the brain and surrounding structures were obtained without and with intravenous contrast.  CONTRAST:  78mL MULTIHANCE GADOBENATE DIMEGLUMINE 529 MG/ML IV SOLN  COMPARISON:  None.  FINDINGS: The diffusion-weighted images demonstrate no evidence for acute or subacute infarction. Acute hemorrhage or mass lesion is present. The ventricles are of normal size. No significant white matter disease is present. This basal ganglia and brainstem are within normal limits.  Flow is present in the major intracranial arteries. The globes and orbits are intact. A polyp or mucous retention cyst is present in the right maxillary sinus. The remaining paranasal sinuses and the mastoid air cells are clear.  Skullbase is within normal limits. Midline structures are unremarkable.  IMPRESSION: 1. Normal MRI appearance the brain. 2.  Small polyp or mucous retention cyst posteriorly in the right maxillary sinus.   Electronically Signed   By: San Morelle M.D.   On: 02/05/2015 20:45    Physical Exam  Constitutional: She is oriented to person, place, and time. She appears well-developed and well-nourished. No distress.  HENT:  Head: Normocephalic.  Right Ear: External ear normal.  Left Ear: External ear normal.  Nose: Nose normal.  Mouth/Throat: Oropharynx is clear and moist.  Eyes: Conjunctivae and EOM are normal. Pupils are equal, round, and reactive to light.  Neck: Normal range of motion. No JVD present.  Cardiovascular: Normal rate, regular rhythm, normal heart sounds and intact distal pulses.  Exam reveals no gallop.   No murmur heard. Pulmonary/Chest: Effort normal and breath sounds normal. No respiratory distress. She has no wheezes. She has no rales. She exhibits no tenderness.  Abdominal: Soft. Bowel sounds are normal. She exhibits no  distension and no mass. There is no tenderness.  Musculoskeletal: Normal range of motion. She exhibits no edema or tenderness.  Neurological: She is alert and oriented to person, place, and time. She has normal reflexes.  Unstable gait.  Skin: Skin is warm and dry. She is not diaphoretic.  Psychiatric: She has a normal mood and affect.     Assessment & Plan:   1. Benign paroxysmal positional vertigo, bilateral Patient advised to continue meclizine and change position slowly   2. Orthostatic hypotension Continue midodrine.  3. Thyrotoxicosis without thyroid storm, unspecified thyrotoxicosis type Continue methimazole and follow up with endocrine.   4. Other fatigue I will send off labs to exclude connective tissue disorder - ANA - Sedimentation rate - Rheumatoid factor - CK  5. Near syncope I will need to exclude cardiac artery stenosis Neurological etiology will also need to be excluded as well. - VAS US CAROTID; Future - Ambulatory referral to Neurology     Follow-up: Return in about 3 weeks (around 03/09/2015) for PCP-follow-up on near syncope.Arnoldo Morale MD

## 2015-02-18 NOTE — Telephone Encounter (Signed)
Patient called to speak to nurse in regards to SCAT information that needed to be faxed. Please f/u with pt.

## 2015-02-19 ENCOUNTER — Encounter: Payer: Self-pay | Admitting: Family Medicine

## 2015-02-19 NOTE — Telephone Encounter (Signed)
Just like i informed you verbally, could you please forward this to nursing? I completed the application at her last visit. Thanks

## 2015-02-19 NOTE — Telephone Encounter (Signed)
Please find out what she needs, I can sign her SCAT form once completed. Thanks

## 2015-02-19 NOTE — Telephone Encounter (Signed)
Patient stated that she has no way of coming back to drop off the paperwork. Patient said that she needs SCAT to come to her appointments, she said that all she wants is for provider to call Anderson Malta in [place] to address the situation. Please f/u with pt.

## 2015-02-19 NOTE — Telephone Encounter (Signed)
Patient called to give the phone number of SCAT. Patient requested to call [facility].  [phone]

## 2015-02-19 NOTE — Telephone Encounter (Signed)
Patient called requesting a letter from provider stating that she is not going to be able to work until her medical issues have been resolved. Patient also want for provider to fax SCAT some information.

## 2015-02-19 NOTE — Telephone Encounter (Signed)
Patient requested a letter stating that she will be out of work until her sickness resolves (vertigo and other problems). Please f/u with pt.

## 2015-02-19 NOTE — Telephone Encounter (Signed)
Patient also requested for her PCP to call Anderson Malta at [facility] because #5 on the application was not completed. Please f/u

## 2015-02-21 ENCOUNTER — Encounter (HOSPITAL_COMMUNITY): Payer: Self-pay

## 2015-02-21 ENCOUNTER — Emergency Department (HOSPITAL_COMMUNITY)
Admission: EM | Admit: 2015-02-21 | Discharge: 2015-02-21 | Disposition: A | Discharge status: Home / Self Care | Payer: Medicaid Other | Attending: Emergency Medicine | Admitting: Emergency Medicine

## 2015-02-21 DIAGNOSIS — Z86018 Personal history of other benign neoplasm: Secondary | ICD-10-CM | POA: Diagnosis not present

## 2015-02-21 DIAGNOSIS — Y9389 Activity, other specified: Secondary | ICD-10-CM | POA: Diagnosis not present

## 2015-02-21 DIAGNOSIS — J45909 Unspecified asthma, uncomplicated: Secondary | ICD-10-CM | POA: Insufficient documentation

## 2015-02-21 DIAGNOSIS — E0590 Thyrotoxicosis, unspecified without thyrotoxic crisis or storm: Secondary | ICD-10-CM | POA: Insufficient documentation

## 2015-02-21 DIAGNOSIS — Z79899 Other long term (current) drug therapy: Secondary | ICD-10-CM | POA: Diagnosis not present

## 2015-02-21 DIAGNOSIS — Z880 Allergy status to penicillin: Secondary | ICD-10-CM | POA: Diagnosis not present

## 2015-02-21 DIAGNOSIS — Z87891 Personal history of nicotine dependence: Secondary | ICD-10-CM | POA: Insufficient documentation

## 2015-02-21 DIAGNOSIS — T161XXA Foreign body in right ear, initial encounter: Secondary | ICD-10-CM | POA: Insufficient documentation

## 2015-02-21 DIAGNOSIS — Z8659 Personal history of other mental and behavioral disorders: Secondary | ICD-10-CM | POA: Diagnosis not present

## 2015-02-21 DIAGNOSIS — Z8719 Personal history of other diseases of the digestive system: Secondary | ICD-10-CM | POA: Diagnosis not present

## 2015-02-21 DIAGNOSIS — E669 Obesity, unspecified: Secondary | ICD-10-CM | POA: Diagnosis not present

## 2015-02-21 DIAGNOSIS — W228XXA Striking against or struck by other objects, initial encounter: Secondary | ICD-10-CM | POA: Diagnosis not present

## 2015-02-21 DIAGNOSIS — Y9289 Other specified places as the place of occurrence of the external cause: Secondary | ICD-10-CM | POA: Diagnosis not present

## 2015-02-21 DIAGNOSIS — Y998 Other external cause status: Secondary | ICD-10-CM | POA: Diagnosis not present

## 2015-02-21 NOTE — ED Provider Notes (Signed)
CSN: [id number]     Arrival date & time 02/21/15  2241 History  This chart was scribed for non-physician provider Glendell Docker, NP-C, working with Harvel Quale, MD by Irene Pap, ED Scribe. This patient was seen in room [facility] and patient care was started at 10:56 PM.     Chief Complaint  Patient presents with  . Foreign Body in [place]   The history is provided by the patient. No language interpreter was used.   HPI Comments: Monica Chase is a 39 y.o. female who presents to the Emergency Department complaining of foreign body in right ear onset one day ago. States that she has the white part of a Q-Tip stuck in her ear. Reports hx of similar symptoms. She states that she has tried using scissors in her ear to get it out. Denies any other symptoms.   Past Medical History  Diagnosis Date  . HYPERTHYROIDISM 07/20/2009  . DEPRESSION, MAJOR, RECURRENT 07/26/2006  . ANXIETY 07/26/2006  . ASTHMA, INTERMITTENT 07/26/2006  . HYPERGLYCEMIA 09/28/2009  . CONSTIPATION, CHRONIC 06/22/2010  . Dizziness 11/09/2010  . OBESITY 01/31/2008  . Fibroid    Past Surgical History  Procedure Laterality Date  . Tubal ligation  2008  . Iud removal  10/07    Mirena removed 05/10/2004  . Pilonidal cyst excision    . Vaginal delivery      X 4  . Vaginal hysterectomy N/A 07/07/2013    Procedure: HYSTERECTOMY VAGINAL;  Surgeon: Donnamae Jude, MD;  Location: [facility];  Service: Gynecology;  Laterality: N/A;  . Bilateral salpingectomy Bilateral 07/07/2013    Procedure: BILATERAL SALPINGECTOMY;  Surgeon: Donnamae Jude, MD;  Location: [facility];  Service: Gynecology;  Laterality: Bilateral;   Family History  Problem Relation Age of Onset  . Hypertension Other    Social History  Substance Use Topics  . Smoking status: Former Smoker    Quit date: 11/08/2005  . Smokeless tobacco: Never Used  . Alcohol Use: No   OB History    Gravida Para Term Preterm AB TAB SAB Ectopic Multiple Living   4 4 4       4       Review of Systems  HENT:       Foreign body in ear  All other systems reviewed and are negative.  Allergies  Penicillins  Home Medications   Prior to Admission medications   Medication Sig Start Date End Date Taking? Authorizing Provider  albuterol (PROVENTIL HFA;VENTOLIN HFA) 108 (90 BASE) MCG/ACT inhaler Inhale 2 puffs into the lungs every 6 (six) hours as needed for wheezing. 06/08/14   Donnamae Jude, MD  cholecalciferol (VITAMIN D) 1000 UNITS tablet Take 1,000 Units by mouth daily.     Historical Provider, MD  meclizine (ANTIVERT) 25 MG tablet Take 1 tablet (25 mg total) by mouth 3 (three) times daily. 02/09/15   Bonnielee Haff, MD  midodrine (PROAMATINE) 5 MG tablet Take 1 tablet (5 mg total) by mouth 3 (three) times daily with meals. 02/09/15   Bonnielee Haff, MD  montelukast (SINGULAIR) 10 MG tablet Take 1 tablet (10 mg total) by mouth at bedtime. Patient not taking: Reported on 02/05/2015 10/10/14   Lutricia Feil, PA  Multiple Vitamins-Minerals (MULTIVITAMIN) tablet Take 1 tablet by mouth daily. Patient not taking: Reported on 02/16/2015 06/08/14   Donnamae Jude, MD  promethazine (PHENERGAN) 12.5 MG tablet Take 1 tablet (12.5 mg total) by mouth every 6 (six) hours as needed (dizziness  not responding to vertigo). Patient not taking: Reported on 02/16/2015 02/09/15   Bonnielee Haff, MD  propylthiouracil (PTU) 50 MG tablet Take 1 tablet (50 mg total) by mouth daily. 02/16/15   Renato Shin, MD   BP 115/74 mmHg  Pulse 63  Temp(Src) 98.3 F (36.8 C) (Oral)  Resp 16  SpO2 100%  LMP 06/17/2013 Physical Exam  Constitutional: She is oriented to person, place, and time. She appears well-developed and well-nourished.  HENT:  Head: Normocephalic and atraumatic.  Left Ear: External ear normal.  Right material noted in right ear canal  Eyes: EOM are normal.  Neck: Normal range of motion. Neck supple.  Cardiovascular: Normal rate.   Pulmonary/Chest: Effort normal.   Musculoskeletal: Normal range of motion.  Neurological: She is alert and oriented to person, place, and time.  Skin: Skin is warm and dry.  Psychiatric: She has a normal mood and affect. Her behavior is normal.  Nursing note and vitals reviewed.   ED Course  FOREIGN BODY REMOVAL Date/Time: 02/21/2015 11:02 PM Performed by: Glendell Docker Authorized by: Glendell Docker Consent: Verbal consent obtained. Consent given by: patient Patient identity confirmed: verbally with patient Time out: Immediately prior to procedure a "time out" was called to verify the correct patient, procedure, equipment, support staff and site/side marked as required. Body area: ear Location details: right ear Patient restrained: no Localization method: visualized Removal mechanism: forceps Complexity: simple 1 objects recovered. Objects recovered: cotton of q-tip Post-procedure assessment: foreign body removed Patient tolerance: Patient tolerated the procedure well with no immediate complications   (including critical care time) DIAGNOSTIC STUDIES: Oxygen Saturation is 100% on RA, normal by my interpretation.    COORDINATION OF CARE: 10:59 PM-Discussed treatment plan which includes removal of foreign body with pt at bedside and pt agreed to plan.   Labs Review Labs Reviewed - No data to display  Imaging Review No results found.    EKG Interpretation None      MDM   Final diagnoses:  Foreign body in ear, right, initial encounter    fb removed without any sing of infection noted after removal of fb  I personally performed the services described in this documentation, which was scribed in my presence. The recorded information has been reviewed and is accurate.    Glendell Docker, NP 02/21/15 [id number]  Harvel Quale, MD 02/27/15 [phone]

## 2015-02-21 NOTE — ED Notes (Signed)
Yesterday patient used a cue tip in right ear but the white part of it got stuck in her ear. Today it started aching.

## 2015-02-21 NOTE — Discharge Instructions (Signed)
Ear Foreign Body °An ear foreign body is an object that is stuck in the ear. Objects in the ear can cause pain, hearing loss, and buzzing or roaring sounds. They can also cause fluid to come from the ear. °HOME CARE  °· Keep all doctor visits as told. °· Keep small objects away from children. Tell them not to put things in their ears. °GET HELP RIGHT AWAY IF:  °· You have blood coming from your ear. °· You have more pain or puffiness (swelling) in the ear. °· You have trouble hearing. °· You have fluid (discharge) coming from the ear. °· You have a fever. °· You get a headache. °MAKE SURE YOU:  °· Understand these instructions. °· Will watch your condition. °· Will get help right away if you are not doing well or get worse. °Document Released: 11/02/2009 Document Revised: 08/07/2011 Document Reviewed: 11/02/2009 °Exit[employer]® Patient Information ©2015 [employer]. This information is not intended to replace advice given to you by your health care provider. Make sure you discuss any questions you have with your health care provider. ° °

## 2015-02-23 ENCOUNTER — Telehealth: Payer: Self-pay | Admitting: *Deleted

## 2015-02-23 NOTE — Telephone Encounter (Signed)
-----   Message from Arnoldo Morale, MD sent at 02/17/2015 10:45 PM EDT ----- Please inform the patient that labs are normal. Thank you.

## 2015-02-23 NOTE — Telephone Encounter (Signed)
Left HIPAA compliant message for patient to call Anna,RN at [phone]

## 2015-02-25 ENCOUNTER — Ambulatory Visit (INDEPENDENT_AMBULATORY_CARE_PROVIDER_SITE_OTHER): Payer: Medicaid Other | Admitting: Neurology

## 2015-02-25 ENCOUNTER — Encounter: Payer: Self-pay | Admitting: Neurology

## 2015-02-25 ENCOUNTER — Telehealth: Payer: Self-pay | Admitting: Family Medicine

## 2015-02-25 VITALS — BP 116/80 | HR 70 | Ht 71.0 in | Wt 206.0 lb

## 2015-02-25 DIAGNOSIS — R55 Syncope and collapse: Secondary | ICD-10-CM | POA: Diagnosis not present

## 2015-02-25 NOTE — Telephone Encounter (Signed)
Patient called requesting for provider to call SCAT. Patient stated that she fax the SCAT application back to the provider last Friday, because the provider did not fill out everything in the application. Please f/u with pt.

## 2015-02-25 NOTE — Progress Notes (Signed)
Reason for visit: Near-syncope  Referring physician: [facility] Monica Chase is a 39 y.o. female  History of present illness:  [name] is a 39 year old right-handed black female with a history of intermittent episodes of dizziness and lightheaded sensations off and on over the last several years, beginning in 2012. The patient will have intermittent episodes of these symptoms, with some relative resolution in between. The patient was admitted to the hospital around 02/05/2015 with onset of increased heart rate, sweating and the palms of the hands, lightheaded sensations and near-syncope. The patient did not lose consciousness. The patient may have a lightheaded feeling, not vertigo. The patient may also have a pressure sensation of the head, not a true headache. The patient was noted to have some orthostasis, she has been placed on midodrine taking 5 mg 3 times daily. She underwent a 2-D echocardiogram that revealed an ejection fraction of 55-60%, with an unremarkable study. MRI of the brain was unremarkable. The patient was placed on meclizine for some reason, she does not believe that this has been helpful for her. She will have good and bad days, she still feels somewhat dizzy at times. She feels better sitting or lying down. She denies any numbness or weakness of the extremities, although her legs feel somewhat weak at times when she stands up. She denies issues controlling the bowels or the bladder. She denies neck pain, but she does have some low back pain. She may have some visual dimming during the periods of dizziness. She is sent to this office or an evaluation.  Past Medical History  Diagnosis Date  . HYPERTHYROIDISM 07/20/2009  . DEPRESSION, MAJOR, RECURRENT 07/26/2006  . ANXIETY 07/26/2006  . ASTHMA, INTERMITTENT 07/26/2006  . HYPERGLYCEMIA 09/28/2009  . CONSTIPATION, CHRONIC 06/22/2010  . Dizziness 11/09/2010  . OBESITY 01/31/2008  . Fibroid     Past Surgical History    Procedure Laterality Date  . Tubal ligation  2008  . Iud removal  10/07    Mirena removed 05/10/2004  . Pilonidal cyst excision    . Vaginal delivery      X 4  . Vaginal hysterectomy N/A 07/07/2013    Procedure: HYSTERECTOMY VAGINAL;  Surgeon: Donnamae Jude, MD;  Location: [facility];  Service: Gynecology;  Laterality: N/A;  . Bilateral salpingectomy Bilateral 07/07/2013    Procedure: BILATERAL SALPINGECTOMY;  Surgeon: Donnamae Jude, MD;  Location: [facility];  Service: Gynecology;  Laterality: Bilateral;    Family History  Problem Relation Age of Onset  . Hypertension Other   . Breast cancer Mother   . Hypertension Mother   . Diabetes Mother   . Hypertension Father   . Diabetes Father   . Breast cancer Maternal Aunt   . Ovarian cancer Maternal Aunt   . Stomach cancer Paternal Grandmother   . Hypertension Sister     Social history:  reports that she quit smoking about 9 years ago. She has never used smokeless tobacco. She reports that she does not drink alcohol or use illicit drugs.  Medications:  Prior to Admission medications   Medication Sig Start Date End Date Taking? Authorizing Provider  albuterol (PROVENTIL HFA;VENTOLIN HFA) 108 (90 BASE) MCG/ACT inhaler Inhale 2 puffs into the lungs every 6 (six) hours as needed for wheezing. 06/08/14  Yes Donnamae Jude, MD  cholecalciferol (VITAMIN D) 1000 UNITS tablet Take 1,000 Units by mouth daily.    Yes Historical Provider, MD  meclizine (ANTIVERT) 25 MG tablet Take  1 tablet (25 mg total) by mouth 3 (three) times daily. 02/09/15  Yes Bonnielee Haff, MD  midodrine (PROAMATINE) 5 MG tablet Take 1 tablet (5 mg total) by mouth 3 (three) times daily with meals. 02/09/15  Yes Bonnielee Haff, MD  propylthiouracil (PTU) 50 MG tablet Take 1 tablet (50 mg total) by mouth daily. 02/16/15  Yes Renato Shin, MD      Allergies  Allergen Reactions  . Penicillins Other (See Comments)    Reaction unknown    ROS:  Out of a complete 14 system review of  symptoms, the patient complains only of the following symptoms, and all other reviewed systems are negative.  Palpitations of the heart Constipation Increased thirst Joint pain, achy muscles Weakness, dizziness, near syncope Anxiety, decreased energy  Blood pressure 116/80, pulse 70, height 5\' 11"  (1.803 m), weight 206 lb (93.441 kg), last menstrual period 06/17/2013.   Blood pressure, left arm, standing is 701 systolic. Blood pressure, sitting, left arm is 779 systolic. Heart rate sitting is 64, heart rate standing is 76.  Physical Exam  General: The patient is alert and cooperative at the time of the examination.  Eyes: Pupils are equal, round, and reactive to light. Discs are flat bilaterally.  Neck: The neck is supple, no carotid bruits are noted.  Respiratory: The respiratory examination is clear.  Cardiovascular: The cardiovascular examination reveals a regular rate and rhythm, no obvious murmurs or rubs are noted.  Skin: Extremities are without significant edema.  Neurologic Exam  Mental status: The patient is alert and oriented x 3 at the time of the examination. The patient has apparent normal recent and remote memory, with an apparently normal attention span and concentration ability.  Cranial nerves: Facial symmetry is present. There is good sensation of the face to pinprick and soft touch bilaterally. The strength of the facial muscles and the muscles to head turning and shoulder shrug are normal bilaterally. Speech is well enunciated, no aphasia or dysarthria is noted. Extraocular movements are full. Visual fields are full. The tongue is midline, and the patient has symmetric elevation of the soft palate. No obvious hearing deficits are noted.  Motor: The motor testing reveals 5 over 5 strength of all 4 extremities. Good symmetric motor tone is noted throughout.  Sensory: Sensory testing is intact to pinprick, soft touch, vibration sensation, and position sense on all  4 extremities. No evidence of extinction is noted.  Coordination: Cerebellar testing reveals good finger-nose-finger and heel-to-shin bilaterally.  Gait and station: Gait is normal. Tandem gait is normal. Romberg is negative. No drift is seen.  Reflexes: Deep tendon reflexes are symmetric and normal bilaterally. Toes are downgoing bilaterally.   MRI brain 02/05/15:  IMPRESSION: 1. Normal MRI appearance the brain. 2. Small polyp or mucous retention cyst posteriorly in the right maxillary sinus.  * MRI scan images were reviewed online. I agree with the written report.    Assessment/Plan:  1. Dizziness, orthostasis, questionable POTS  The patient has an unremarkable examination today. She does not have orthostatic hypotension, but she is on midodrine currently. The patient reports some issues with increased heart rate, it is possible that she may have a POTS syndrome. The patient will be taken off of the meclizine, and she will increase the salt intake in her diet. She will follow-up in 4 or 5 months. We will continue to monitor her blood pressures.  Jill Alexanders MD 02/25/2015 7:17 PM  [facility] [address] [place],  [place] [address]  Phone [phone] Fax [phone]

## 2015-02-25 NOTE — Patient Instructions (Addendum)
   Stop the meclizine. Increase salt and fluids during the day.  Near-Syncope Near-syncope (commonly known as near fainting) is sudden weakness, dizziness, or feeling like you might pass out. This can happen when getting up or while standing for a long time. It is caused by a sudden decrease in blood flow to the brain, which can occur for various reasons. Most of the reasons are not serious.  HOME CARE Watch your condition for any changes.  Have someone stay with you until you feel stable.  If you feel like you are going to pass out:  Lie down right away.  Prop your feet up if you can.  Breathe deeply and steadily.  Move only when the feeling has gone away. Most of the time, this feeling lasts only a few minutes. You may feel tired for several hours.  Drink enough fluids to keep your pee (urine) clear or pale yellow.  If you are taking blood pressure or heart medicine, stand up slowly.  Follow up with your doctor as told. GET HELP RIGHT AWAY IF:   You have a severe headache.  You have unusual pain in the chest, belly (abdomen), or back.  You have bleeding from the mouth or butt (rectum), or you have black or tarry poop (stool).  You feel your heart beat differently than normal, or you have a very fast pulse.  You pass out, or you twitch and shake when you pass out.  You pass out when sitting or lying down.  You feel confused.  You have trouble walking.  You are weak.  You have vision problems. MAKE SURE YOU:   Understand these instructions.  Will watch your condition.  Will get help right away if you are not doing well or get worse. Document Released: 11/01/2007 Document Revised: 05/20/2013 Document Reviewed: 10/18/2012 [facility] Patient Information 2015 [place], [place]. This information is not intended to replace advice given to you by your health care provider. Make sure you discuss any questions you have with your health care provider.

## 2015-02-26 ENCOUNTER — Telehealth: Payer: Self-pay | Admitting: *Deleted

## 2015-02-26 NOTE — Telephone Encounter (Signed)
Spoke to Olga at [employer] office and answered required questions so that patient can receive door to door SCAT service instead of having to go to designated bus stops.  Anderson Malta stated that the form was complete and the patient should be provided service.  Patient called and was told information and was appreciative.

## 2015-03-04 ENCOUNTER — Telehealth: Payer: Self-pay | Admitting: Family Medicine

## 2015-03-04 NOTE — Telephone Encounter (Signed)
Received a fax today from [facility] needing information of her leave of absence for work in order to help her reduce her rent due to her health condition and leave of work. Please follow up with pt once paperwork is completed. Thank you.

## 2015-03-10 ENCOUNTER — Ambulatory Visit: Payer: Medicaid Other | Attending: Family Medicine | Admitting: Family Medicine

## 2015-03-10 ENCOUNTER — Encounter: Payer: Self-pay | Admitting: Family Medicine

## 2015-03-10 VITALS — BP 122/77 | HR 78 | Temp 98.3°F | Resp 16 | Ht 71.0 in | Wt 210.0 lb

## 2015-03-10 DIAGNOSIS — R5383 Other fatigue: Secondary | ICD-10-CM | POA: Diagnosis not present

## 2015-03-10 DIAGNOSIS — E0590 Thyrotoxicosis, unspecified without thyrotoxic crisis or storm: Secondary | ICD-10-CM | POA: Diagnosis not present

## 2015-03-10 DIAGNOSIS — R55 Syncope and collapse: Secondary | ICD-10-CM | POA: Diagnosis not present

## 2015-03-10 DIAGNOSIS — I951 Orthostatic hypotension: Secondary | ICD-10-CM | POA: Diagnosis present

## 2015-03-10 MED ORDER — MIDODRINE HCL 5 MG PO TABS: 5.0000 mg | ORAL_TABLET | Freq: Three times a day (TID) | ORAL | Status: DC

## 2015-03-10 NOTE — Progress Notes (Signed)
Pt's here for F/up Syncope. Pt states she in no pain today. Needs rx refill of Midodrine.

## 2015-03-10 NOTE — Progress Notes (Signed)
Subjective:    Patient ID: Monica Chase, female    DOB: August 25, 1975, 39 y.o.   MRN: [id number]  HPI Monica Chase is a 39 year old female with a history of hyperthyroidism who was recently hospitalized for near syncope and dizziness with workup unrevealing.   she was placed on meclizine for presumptive vertigo and midodrine for orthostatic hypotension and reports no improvement in symptoms with these medications. I had referred her to neurology and her meclizine was discontinued but she was advised to remain on midodrine for ? Postural orthostatic tachycardia syndrome.  also referred her carotid Dopplers but she missed her appointment due to transportation issues.   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 Currently uses a walker to ambulate and complains of dizziness  Which is worse on standing up and his sense of " feeling off balance"  Leading her to hold onto things while ambulating. She has also noticed some fatigue Stiffness of the muscles and tremors in her hands;  ANA, sedimentation rate, CK all came back negative from her last visit.   Requests completion of FMLA paperwork today.  Past Medical History  Diagnosis Date  . HYPERTHYROIDISM 07/20/2009  . DEPRESSION, MAJOR, RECURRENT 07/26/2006  . ANXIETY 07/26/2006  . ASTHMA, INTERMITTENT 07/26/2006  . HYPERGLYCEMIA 09/28/2009  . CONSTIPATION, CHRONIC 06/22/2010  . Dizziness 11/09/2010  . OBESITY 01/31/2008  . Fibroid     Past Surgical History  Procedure Laterality Date  . Tubal ligation  2008  . Iud removal  10/07    Mirena removed 05/10/2004  . Pilonidal cyst excision    . Vaginal delivery      X 4  . Vaginal hysterectomy N/A 07/07/2013    Procedure: HYSTERECTOMY VAGINAL;  Surgeon: Donnamae Jude, MD;  Location: [facility];  Service: Gynecology;  Laterality: N/A;  . Bilateral salpingectomy Bilateral 07/07/2013    Procedure: BILATERAL  SALPINGECTOMY;  Surgeon: Donnamae Jude, MD;  Location: [facility];  Service: Gynecology;  Laterality: Bilateral;    Social History   Social History  . Marital Status: Divorced    Spouse Name: N/A  . Number of Children: 4  . Years of Education: some coll.   Occupational History  . Not on file.   Social History Main Topics  . Smoking status: Former Smoker    Quit date: 11/08/2005  . Smokeless tobacco: Never Used  . Alcohol Use: No  . Drug Use: No  . Sexual Activity: Not Currently    Birth Control/ Protection: Surgical   Other Topics Concern  . Not on file   Social History Narrative   Single   Patient drinks caffeine rarely.   Patient is right handed.     Allergies  Allergen Reactions  . Penicillins Other (See Comments)    Reaction unknown    Current Outpatient Prescriptions on File Prior to Visit  Medication Sig Dispense Refill  . albuterol (PROVENTIL HFA;VENTOLIN HFA) 108 (90 BASE) MCG/ACT inhaler Inhale 2 puffs into the lungs every 6 (six) hours as needed for wheezing. 1 Inhaler 0  . propylthiouracil (PTU) 50 MG tablet Take 1 tablet (50 mg total) by mouth daily. 30 tablet 3  . cholecalciferol (VITAMIN D) 1000 UNITS tablet Take 1,000 Units by mouth daily.     . [DISCONTINUED] levocetirizine (XYZAL) 5 MG tablet Take 1 tablet (5 mg total) by mouth every evening. (  Patient not taking: Reported on 10/13/2014) 30 tablet 0   No current facility-administered medications on file prior to visit.     Review of Systems Review of Systems  Constitutional: Positive for fatigue. Negative for activity change and appetite change.  HENT: Negative for congestion, sinus pressure and sore throat.   Eyes: Negative for visual disturbance.  Respiratory: Negative for cough, chest tightness, shortness of breath and wheezing.   Cardiovascular: Negative for chest pain and palpitations.  Gastrointestinal: Negative for abdominal pain, constipation and abdominal distention.  Endocrine: Negative  for polydipsia.  Genitourinary: Negative for dysuria and frequency.  Musculoskeletal: Negative for back pain and arthralgias.  Skin: Negative for rash.  Neurological: Positive for weakness and light-headedness. Positive for tremors.  Hematological: Does not bruise/bleed easily.  Psychiatric/Behavioral: Negative for behavioral problems and agitation.      Objective: Filed Vitals:   03/10/15 1039  BP: 122/77  Pulse: 78  Temp: 98.3 F (36.8 C)  TempSrc: Oral  Resp: 16  Height: 5\' 11"  (1.803 m)  Weight: 210 lb (95.255 kg)  SpO2: 100%      Physical Exam  Constitutional: She is oriented to person, place, and time. She appears well-developed and well-nourished. No distress.   Neck: Normal range of motion. No JVD present.  Cardiovascular: Normal rate, regular rhythm, normal heart sounds and intact distal pulses.  Exam reveals no gallop.   No murmur heard. Pulmonary/Chest: Effort normal and breath sounds normal. No respiratory distress. She has no wheezes. She has no rales. She exhibits no tenderness.  Abdominal: Soft. Bowel sounds are normal. She exhibits no distension and no mass. There is no tenderness.  Musculoskeletal: Normal range of motion. She exhibits no edema or tenderness, no tremors noticed on the out stretched hand.  Neurological: She is alert and oriented to person, place, and time. She has normal reflexes.  Unstable gait.  Skin: Skin is warm and dry. She is not diaphoretic.  Psychiatric: She has a normal mood and affect.         Assessment & Plan:   1. Orthostatic hypotension Continue midodrine.  2. Thyrotoxicosis without thyroid storm, unspecified thyrotoxicosis type Continue methimazole and follow up with endocrine.   3. Other fatigue/muscle stiffness  workup unrevealing - ANA, Sedimentation rate, Rheumatoid factor, CK all negative  4. Near syncope  seen by neurology and no neurological etiology identified I will need to exclude cardiac artery  stenosis - VAS US CAROTID; Future;  Patient missed the appointment and so I have advised her to call and re scheduled     Completed FMLA paperwork.

## 2015-03-10 NOTE — Patient Instructions (Signed)

## 2015-03-18 ENCOUNTER — Telehealth: Payer: Self-pay

## 2015-03-18 NOTE — Telephone Encounter (Signed)
CMA called pt, pt verified name and DOB. Pt informed me that Medicaid sent her a copy of the denial letter by mail.

## 2015-03-22 ENCOUNTER — Ambulatory Visit: Payer: Medicaid Other | Admitting: Endocrinology

## 2015-03-23 ENCOUNTER — Telehealth: Payer: Self-pay

## 2015-03-23 NOTE — Telephone Encounter (Signed)
CMA called pt, pt phone said, "Pt is not available right please call back later." Wasn't able to leave a message.

## 2015-03-23 NOTE — Telephone Encounter (Signed)
-----   Message from Nila Nephew, RN sent at 03/23/2015 12:36 PM EDT -----   ----- Message -----    From: Arnoldo Morale, MD    Sent: 02/17/2015  10:45 PM      To: Esperanza Sheets, RN  Please inform the patient that labs are normal. Thank you.

## 2015-03-25 NOTE — Telephone Encounter (Signed)
-----   Message from Nila Nephew, RN sent at 03/23/2015 12:37 PM EDT -----   ----- Message -----    From: Arnoldo Morale, MD    Sent: 02/17/2015  10:45 PM      To: Esperanza Sheets, RN  Please inform the patient that labs are normal. Thank you.

## 2015-03-25 NOTE — Telephone Encounter (Signed)
CMA attempted to call pt twice pt has not responsed. Letter will be sent to pt's address on file.

## 2015-04-09 ENCOUNTER — Encounter: Payer: Self-pay | Admitting: Family Medicine

## 2015-04-09 ENCOUNTER — Ambulatory Visit: Payer: Medicaid Other | Attending: Family Medicine | Admitting: Family Medicine

## 2015-04-09 VITALS — BP 123/80 | HR 72 | Temp 98.0°F | Resp 16 | Ht 71.0 in | Wt 213.0 lb

## 2015-04-09 DIAGNOSIS — F419 Anxiety disorder, unspecified: Secondary | ICD-10-CM | POA: Insufficient documentation

## 2015-04-09 DIAGNOSIS — M545 Low back pain, unspecified: Secondary | ICD-10-CM

## 2015-04-09 DIAGNOSIS — F411 Generalized anxiety disorder: Secondary | ICD-10-CM | POA: Diagnosis not present

## 2015-04-09 DIAGNOSIS — E0590 Thyrotoxicosis, unspecified without thyrotoxic crisis or storm: Secondary | ICD-10-CM

## 2015-04-09 DIAGNOSIS — M549 Dorsalgia, unspecified: Secondary | ICD-10-CM | POA: Insufficient documentation

## 2015-04-09 MED ORDER — BUSPIRONE HCL 5 MG PO TABS: 5.0000 mg | ORAL_TABLET | Freq: Three times a day (TID) | ORAL | Status: DC | PRN

## 2015-04-09 MED ORDER — MELOXICAM 7.5 MG PO TABS: 7.5000 mg | ORAL_TABLET | Freq: Every day | ORAL | Status: DC

## 2015-04-09 NOTE — Patient Instructions (Signed)
Generalized Anxiety Disorder Generalized anxiety disorder (GAD) is a mental disorder. It interferes with life functions, including relationships, work, and school. GAD is different from normal anxiety, which everyone experiences at some point in their lives in response to specific life events and activities. Normal anxiety actually helps us prepare for and get through these life events and activities. Normal anxiety goes away after the event or activity is over.  GAD causes anxiety that is not necessarily related to specific events or activities. It also causes excess anxiety in proportion to specific events or activities. The anxiety associated with GAD is also difficult to control. GAD can vary from mild to severe. People with severe GAD can have intense waves of anxiety with physical symptoms (panic attacks).  SYMPTOMS The anxiety and worry associated with GAD are difficult to control. This anxiety and worry are related to many life events and activities and also occur more days than not for 6 months or longer. People with GAD also have three or more of the following symptoms (one or more in children):  Restlessness.   Fatigue.  Difficulty concentrating.   Irritability.  Muscle tension.  Difficulty sleeping or unsatisfying sleep. DIAGNOSIS GAD is diagnosed through an assessment by your health care provider. Your health care provider will ask you questions aboutyour mood,physical symptoms, and events in your life. Your health care provider may ask you about your medical history and use of alcohol or drugs, including prescription medicines. Your health care provider may also do a physical exam and blood tests. Certain medical conditions and the use of certain substances can cause symptoms similar to those associated with GAD. Your health care provider may refer you to a mental health specialist for further evaluation. TREATMENT The following therapies are usually used to treat GAD:    Medication. Antidepressant medication usually is prescribed for long-term daily control. Antianxiety medicines may be added in severe cases, especially when panic attacks occur.   Talk therapy (psychotherapy). Certain types of talk therapy can be helpful in treating GAD by providing support, education, and guidance. A form of talk therapy called cognitive behavioral therapy can teach you healthy ways to think about and react to daily life events and activities.  Stress managementtechniques. These include yoga, meditation, and exercise and can be very helpful when they are practiced regularly. A mental health specialist can help determine which treatment is best for you. Some people see improvement with one therapy. However, other people require a combination of therapies.   This information is not intended to replace advice given to you by your health care provider. Make sure you discuss any questions you have with your health care provider.   Document Released: 09/09/2012 Document Revised: 06/05/2014 Document Reviewed: 09/09/2012 Elsevier Interactive Patient Education 2016 [employer]  

## 2015-04-09 NOTE — Progress Notes (Signed)
Pt's here for medical clearance to return to work.  Pt reports feeling good. Pt states that she's having stiffness in lower back x57months. Rate pain at 4/10. Describes pain stiffness and pressure.   Pt's requesting refill Diazapam, Albuterol

## 2015-04-09 NOTE — Progress Notes (Signed)
Subjective:    Patient ID: Monica Chase, female    DOB: 1976-03-29, 39 y.o.   MRN: [id number]  HPI Monica Chase is a 39 year old female with a h/o Hyperthyroidism here because she needs a note to return to work as she feels fine now and the previous dizziness which she had been experiencing has resolved. She is no longer using a walker to ambulate and has been driving.  She requests a refill on diazepam which she was receiving while a patient of family medicine clinic for intermittent anxiety.States she is sometime anxious for no reason and has sweaty palms.  Also complains of midline low back pain which she rates at 4/10 with no history of back trauma which doesn't respond to use of OTC analgesics.  Past Medical History  Diagnosis Date  . HYPERTHYROIDISM 07/20/2009  . DEPRESSION, MAJOR, RECURRENT 07/26/2006  . ANXIETY 07/26/2006  . ASTHMA, INTERMITTENT 07/26/2006  . HYPERGLYCEMIA 09/28/2009  . CONSTIPATION, CHRONIC 06/22/2010  . Dizziness 11/09/2010  . OBESITY 01/31/2008  . Fibroid     Past Surgical History  Procedure Laterality Date  . Tubal ligation  2008  . Iud removal  10/07    Mirena removed 05/10/2004  . Pilonidal cyst excision    . Vaginal delivery      X 4  . Vaginal hysterectomy N/A 07/07/2013    Procedure: HYSTERECTOMY VAGINAL;  Surgeon: Donnamae Jude, MD;  Location: [facility];  Service: Gynecology;  Laterality: N/A;  . Bilateral salpingectomy Bilateral 07/07/2013    Procedure: BILATERAL SALPINGECTOMY;  Surgeon: Donnamae Jude, MD;  Location: [facility];  Service: Gynecology;  Laterality: Bilateral;    Social History   Social History  . Marital Status: Divorced    Spouse Name: N/A  . Number of Children: 4  . Years of Education: some coll.   Occupational History  . Not on file.   Social History Main Topics  . Smoking status: Former Smoker    Quit date: 11/08/2005  . Smokeless tobacco: Never Used  . Alcohol Use: No  . Drug Use: No  . Sexual Activity: Not Currently      Birth Control/ Protection: Surgical   Other Topics Concern  . Not on file   Social History Narrative   Single   Patient drinks caffeine rarely.   Patient is right handed.     Allergies  Allergen Reactions  . Penicillins Other (See Comments)    Reaction unknown    Current Outpatient Prescriptions on File Prior to Visit  Medication Sig Dispense Refill  . albuterol (PROVENTIL HFA;VENTOLIN HFA) 108 (90 BASE) MCG/ACT inhaler Inhale 2 puffs into the lungs every 6 (six) hours as needed for wheezing. 1 Inhaler 0  . cholecalciferol (VITAMIN D) 1000 UNITS tablet Take 1,000 Units by mouth daily.     . midodrine (PROAMATINE) 5 MG tablet Take 1 tablet (5 mg total) by mouth 3 (three) times daily with meals. 90 tablet 1  . propylthiouracil (PTU) 50 MG tablet Take 1 tablet (50 mg total) by mouth daily. 30 tablet 3  . [DISCONTINUED] levocetirizine (XYZAL) 5 MG tablet Take 1 tablet (5 mg total) by mouth every evening. (Patient not taking: Reported on 10/13/2014) 30 tablet 0   No current facility-administered medications on file prior to visit.     Review of Systems  Constitutional: Negative for activity change, appetite change and fatigue.  HENT: Negative for congestion, sinus pressure and sore throat.   Eyes: Negative for visual disturbance.  Respiratory: Negative for cough, chest tightness, shortness of breath and wheezing.   Cardiovascular: Negative for chest pain and palpitations.  Gastrointestinal: Negative for abdominal pain, constipation and abdominal distention.  Endocrine: Negative for polydipsia.  Genitourinary: Negative for dysuria and frequency.  Musculoskeletal: Positive for back pain. Negative for arthralgias.  Skin: Negative for rash.  Neurological: Negative for tremors, light-headedness and numbness.  Hematological: Does not bruise/bleed easily.  Psychiatric/Behavioral: Negative for behavioral problems and agitation. The patient is nervous/anxious.         Objective: Filed Vitals:   04/09/15 1203  BP: 123/80  Pulse: 72  Temp: 98 F (36.7 C)  TempSrc: Oral  Resp: 16  Height: 5\' 11"  (1.803 m)  Weight: 213 lb (96.616 kg)  SpO2: 99%      Physical Exam  Constitutional: She is oriented to person, place, and time. She appears well-developed and well-nourished.  Cardiovascular: Normal rate, normal heart sounds and intact distal pulses.   No murmur heard. Pulmonary/Chest: Effort normal and breath sounds normal. She has no wheezes. She has no rales. She exhibits no tenderness.  Abdominal: Soft. Bowel sounds are normal. She exhibits no distension and no mass. There is no tenderness.  Musculoskeletal: Normal range of motion.  Neurological: She is alert and oriented to person, place, and time.          Assessment & Plan:  Back pain: Cannot exclude early Osteoarthritis Advised on weight loss Placed on NSAIDS  Anxiety: Discontinue Diazepam Placed on Buspar.  Hyperthyroidism: Managed by Endocrine

## 2015-04-15 ENCOUNTER — Emergency Department (HOSPITAL_COMMUNITY)
Admission: EM | Admit: 2015-04-15 | Discharge: 2015-04-15 | Disposition: A | Discharge status: Home / Self Care | Payer: Medicaid Other | Source: Home / Self Care

## 2015-04-15 ENCOUNTER — Encounter (HOSPITAL_COMMUNITY): Payer: Self-pay | Admitting: *Deleted

## 2015-04-15 DIAGNOSIS — J0101 Acute recurrent maxillary sinusitis: Secondary | ICD-10-CM | POA: Diagnosis not present

## 2015-04-15 MED ORDER — DOXYCYCLINE HYCLATE 100 MG PO CAPS: 100.0000 mg | ORAL_CAPSULE | Freq: Two times a day (BID) | ORAL | Status: DC

## 2015-04-15 MED ORDER — IPRATROPIUM BROMIDE 0.06 % NA SOLN: 2.0000 | Freq: Four times a day (QID) | NASAL | Status: DC

## 2015-04-15 NOTE — Discharge Instructions (Signed)
Drink plenty of fluids as discussed, use medicine as prescribed, and mucinex or delsym for cough. Return or see your doctor if further problems °

## 2015-04-15 NOTE — ED Notes (Signed)
Pt  Reports    Facial  Pain      With  Sinus  Congestion      And drainage       For    1  Week        Pt    Reports      Symptoms      Of     Pressure           When  She  Looks  Down

## 2015-04-15 NOTE — ED Provider Notes (Signed)
CSN: [id number]     Arrival date & time 04/15/15  1657 History   None    Chief Complaint  Patient presents with  . Facial Pain   (Consider location/radiation/quality/duration/timing/severity/associated sxs/prior Treatment) Patient is a 39 y.o. female presenting with URI. The history is provided by the patient.  URI Presenting symptoms: congestion, cough, facial pain and rhinorrhea   Presenting symptoms: no fever and no sore throat   Severity:  Mild Onset quality:  Gradual Duration:  1 week Progression:  Worsening Chronicity:  New Relieved by:  None tried Worsened by:  Nothing tried Associated symptoms: sinus pain   Risk factors: no sick contacts     Past Medical History  Diagnosis Date  . HYPERTHYROIDISM 07/20/2009  . DEPRESSION, MAJOR, RECURRENT 07/26/2006  . ANXIETY 07/26/2006  . ASTHMA, INTERMITTENT 07/26/2006  . HYPERGLYCEMIA 09/28/2009  . CONSTIPATION, CHRONIC 06/22/2010  . Dizziness 11/09/2010  . OBESITY 01/31/2008  . Fibroid    Past Surgical History  Procedure Laterality Date  . Tubal ligation  2008  . Iud removal  10/07    Mirena removed 05/10/2004  . Pilonidal cyst excision    . Vaginal delivery      X 4  . Vaginal hysterectomy N/A 07/07/2013    Procedure: HYSTERECTOMY VAGINAL;  Surgeon: Donnamae Jude, MD;  Location: [facility];  Service: Gynecology;  Laterality: N/A;  . Bilateral salpingectomy Bilateral 07/07/2013    Procedure: BILATERAL SALPINGECTOMY;  Surgeon: Donnamae Jude, MD;  Location: [facility];  Service: Gynecology;  Laterality: Bilateral;   Family History  Problem Relation Age of Onset  . Hypertension Other   . Breast cancer Mother   . Hypertension Mother   . Diabetes Mother   . Hypertension Father   . Diabetes Father   . Breast cancer Maternal Aunt   . Ovarian cancer Maternal Aunt   . Stomach cancer Paternal Grandmother   . Hypertension Sister    Social History  Substance Use Topics  . Smoking status: Former Smoker    Quit date: 11/08/2005  . Smokeless  tobacco: Never Used  . Alcohol Use: No   OB History    Gravida Para Term Preterm AB TAB SAB Ectopic Multiple Living   4 4 4       4      Review of Systems  Constitutional: Negative.  Negative for fever.  HENT: Positive for congestion, postnasal drip, rhinorrhea and sinus pressure. Negative for facial swelling, sore throat and trouble swallowing.   Respiratory: Positive for cough.   Cardiovascular: Negative.   Gastrointestinal: Negative.   Genitourinary: Negative.   All other systems reviewed and are negative.   Allergies  Penicillins  Home Medications   Prior to Admission medications   Medication Sig Start Date End Date Taking? Authorizing Provider  albuterol (PROVENTIL HFA;VENTOLIN HFA) 108 (90 BASE) MCG/ACT inhaler Inhale 2 puffs into the lungs every 6 (six) hours as needed for wheezing. 06/08/14   Donnamae Jude, MD  busPIRone (BUSPAR) 5 MG tablet Take 1 tablet (5 mg total) by mouth 3 (three) times daily as needed. 04/09/15   Arnoldo Morale, MD  cholecalciferol (VITAMIN D) 1000 UNITS tablet Take 1,000 Units by mouth daily.     Historical Provider, MD  doxycycline (VIBRAMYCIN) 100 MG capsule Take 1 capsule (100 mg total) by mouth 2 (two) times daily. 04/15/15   Billy Fischer, MD  ipratropium (ATROVENT) 0.06 % nasal spray Place 2 sprays into both nostrils 4 (four) times daily. 04/15/15   Jeneen Rinks  Damian Leavell, MD  meloxicam (MOBIC) 7.5 MG tablet Take 1 tablet (7.5 mg total) by mouth daily. 04/09/15   Arnoldo Morale, MD  midodrine (PROAMATINE) 5 MG tablet Take 1 tablet (5 mg total) by mouth 3 (three) times daily with meals. 03/10/15   Arnoldo Morale, MD  propylthiouracil (PTU) 50 MG tablet Take 1 tablet (50 mg total) by mouth daily. 02/16/15   Renato Shin, MD   Meds Ordered and Administered this Visit  Medications - No data to display  BP 116/84 mmHg  Pulse 73  Temp(Src) 98.3 F (36.8 C) (Oral)  Resp 16  SpO2 98%  LMP 06/17/2013 No data found.   Physical Exam  Constitutional: She is  oriented to person, place, and time. She appears well-developed and well-nourished. No distress.  HENT:  Head: Normocephalic.  Right Ear: External ear normal.  Left Ear: External ear normal.  Nose: Mucosal edema and rhinorrhea present. Right sinus exhibits no maxillary sinus tenderness and no frontal sinus tenderness. Left sinus exhibits maxillary sinus tenderness and frontal sinus tenderness.  Mouth/Throat: Oropharynx is clear and moist.  Neck: Normal range of motion. Neck supple.  Lymphadenopathy:    She has no cervical adenopathy.  Neurological: She is alert and oriented to person, place, and time.  Skin: Skin is warm and dry.  Nursing note and vitals reviewed.   ED Course  Procedures (including critical care time)  Labs Review Labs Reviewed - No data to display  Imaging Review No results found.   Visual Acuity Review  Right Eye Distance:   Left Eye Distance:   Bilateral Distance:    Right Eye Near:   Left Eye Near:    Bilateral Near:         MDM   1. Acute recurrent maxillary sinusitis        Billy Fischer, MD 04/15/15 [phone]

## 2015-04-19 ENCOUNTER — Ambulatory Visit (INDEPENDENT_AMBULATORY_CARE_PROVIDER_SITE_OTHER): Payer: Medicaid Other | Admitting: Endocrinology

## 2015-04-19 ENCOUNTER — Encounter: Payer: Self-pay | Admitting: Endocrinology

## 2015-04-19 VITALS — BP 120/80 | HR 70 | Temp 98.2°F | Ht 71.0 in | Wt 217.0 lb

## 2015-04-19 DIAGNOSIS — E0590 Thyrotoxicosis, unspecified without thyrotoxic crisis or storm: Secondary | ICD-10-CM

## 2015-04-19 NOTE — Progress Notes (Signed)
Subjective:    Patient ID: Monica Chase, female    DOB: November 30, 1975, 39 y.o.   MRN: [id number]  HPI Pt returns for f/u of hyperthyroidism (dx'ed 2008; it was first dx'ed during a pregnancy; she chose rx with tapazole; she has never had thyroid imaging; she has had TAH; she requested to change to PTU, as she felt tapazole is causing excessive diaphoresis).  pt states she feels well in general, except for weight gain.   Past Medical History  Diagnosis Date  . HYPERTHYROIDISM 07/20/2009  . DEPRESSION, MAJOR, RECURRENT 07/26/2006  . ANXIETY 07/26/2006  . ASTHMA, INTERMITTENT 07/26/2006  . HYPERGLYCEMIA 09/28/2009  . CONSTIPATION, CHRONIC 06/22/2010  . Dizziness 11/09/2010  . OBESITY 01/31/2008  . Fibroid     Past Surgical History  Procedure Laterality Date  . Tubal ligation  2008  . Iud removal  10/07    Mirena removed 05/10/2004  . Pilonidal cyst excision    . Vaginal delivery      X 4  . Vaginal hysterectomy N/A 07/07/2013    Procedure: HYSTERECTOMY VAGINAL;  Surgeon: Donnamae Jude, MD;  Location: [facility];  Service: Gynecology;  Laterality: N/A;  . Bilateral salpingectomy Bilateral 07/07/2013    Procedure: BILATERAL SALPINGECTOMY;  Surgeon: Donnamae Jude, MD;  Location: [facility];  Service: Gynecology;  Laterality: Bilateral;    Social History   Social History  . Marital Status: Divorced    Spouse Name: N/A  . Number of Children: 4  . Years of Education: some coll.   Occupational History  . Not on file.   Social History Main Topics  . Smoking status: Former Smoker    Quit date: 11/08/2005  . Smokeless tobacco: Never Used  . Alcohol Use: No  . Drug Use: No  . Sexual Activity: Not Currently    Birth Control/ Protection: Surgical   Other Topics Concern  . Not on file   Social History Narrative   Single   Patient drinks caffeine rarely.   Patient is right handed.     Current Outpatient Prescriptions on File Prior to Visit  Medication Sig Dispense Refill  . albuterol  (PROVENTIL HFA;VENTOLIN HFA) 108 (90 BASE) MCG/ACT inhaler Inhale 2 puffs into the lungs every 6 (six) hours as needed for wheezing. 1 Inhaler 0  . busPIRone (BUSPAR) 5 MG tablet Take 1 tablet (5 mg total) by mouth 3 (three) times daily as needed. 90 tablet 1  . cholecalciferol (VITAMIN D) 1000 UNITS tablet Take 1,000 Units by mouth daily.     Marland Kitchen doxycycline (VIBRAMYCIN) 100 MG capsule Take 1 capsule (100 mg total) by mouth 2 (two) times daily. 20 capsule 0  . ipratropium (ATROVENT) 0.06 % nasal spray Place 2 sprays into both nostrils 4 (four) times daily. 15 mL 1  . meloxicam (MOBIC) 7.5 MG tablet Take 1 tablet (7.5 mg total) by mouth daily. 30 tablet 1  . midodrine (PROAMATINE) 5 MG tablet Take 1 tablet (5 mg total) by mouth 3 (three) times daily with meals. 90 tablet 1  . propylthiouracil (PTU) 50 MG tablet Take 1 tablet (50 mg total) by mouth daily. 30 tablet 3  . [DISCONTINUED] levocetirizine (XYZAL) 5 MG tablet Take 1 tablet (5 mg total) by mouth every evening. (Patient not taking: Reported on 10/13/2014) 30 tablet 0   No current facility-administered medications on file prior to visit.    Allergies  Allergen Reactions  . Penicillins Other (See Comments)    Reaction unknown  Family History  Problem Relation Age of Onset  . Hypertension Other   . Breast cancer Mother   . Hypertension Mother   . Diabetes Mother   . Hypertension Father   . Diabetes Father   . Breast cancer Maternal Aunt   . Ovarian cancer Maternal Aunt   . Stomach cancer Paternal Grandmother   . Hypertension Sister     BP 120/80 mmHg  Pulse 70  Temp(Src) 98.2 F (36.8 C) (Oral)  Ht 5\' 11"  (1.803 m)  Wt 217 lb (98.431 kg)  BMI 30.28 kg/m2  SpO2 98%  LMP 06/17/2013  Review of Systems Denies fever    Objective:   Physical Exam VITAL SIGNS:  See vs page GENERAL: no distress NECK: Thyroid is slightly and diffusely enlarged.  No thyroid nodule is palpable.  No palpable lymphadenopathy at the anterior  neck. Skin: not diaphoretic.  Neuro: no tremor  Lab Results  Component Value Date   TSH 1.79 04/19/2015      Assessment & Plan:  Hyperthyroidism: well-controlled.    Patient is advised the following: Patient Instructions  blood tests are requested for you today.  We'll let you know about the results.   Please come back for a follow-up appointment in 3 months.   if ever you have fever while taking propylthiouracil, stop it and call us, because of the risk of a rare side-effect.

## 2015-04-19 NOTE — Patient Instructions (Signed)
blood tests are requested for you today.  We'll let you know about the results.   Please come back for a follow-up appointment in 3 months.   if ever you have fever while taking propylthiouracil, stop it and call us, because of the risk of a rare side-effect.

## 2015-04-20 LAB — TSH: TSH: 1.79 u[IU]/mL (ref 0.35–4.50)

## 2015-04-20 LAB — T4, FREE: Free T4: 0.7 ng/dL (ref 0.60–1.60)

## 2015-05-18 ENCOUNTER — Telehealth: Payer: Self-pay | Admitting: Family Medicine

## 2015-05-18 NOTE — Telephone Encounter (Signed)
Patient called and requested a med refill for midodrine (PROAMATINE) 5 MG tablet. Please f/u with pt.

## 2015-05-18 NOTE — Telephone Encounter (Signed)
Will route to MD for approval.

## 2015-05-19 ENCOUNTER — Other Ambulatory Visit: Payer: Self-pay | Admitting: Family Medicine

## 2015-05-19 MED ORDER — MIDODRINE HCL 5 MG PO TABS: 5.0000 mg | ORAL_TABLET | Freq: Three times a day (TID) | ORAL | Status: DC

## 2015-05-19 NOTE — Telephone Encounter (Signed)
Spoke to patient and verified name and date of birth and told MD refilled medication.

## 2015-05-19 NOTE — Telephone Encounter (Signed)
Medication has been refilled.

## 2015-05-25 ENCOUNTER — Encounter (HOSPITAL_COMMUNITY): Payer: Self-pay | Admitting: *Deleted

## 2015-05-25 ENCOUNTER — Emergency Department (HOSPITAL_COMMUNITY)
Admission: EM | Admit: 2015-05-25 | Discharge: 2015-05-25 | Disposition: A | Discharge status: Home / Self Care | Payer: Medicaid Other | Attending: Emergency Medicine | Admitting: Emergency Medicine

## 2015-05-25 DIAGNOSIS — T378X5A Adverse effect of other specified systemic anti-infectives and antiparasitics, initial encounter: Secondary | ICD-10-CM | POA: Diagnosis not present

## 2015-05-25 DIAGNOSIS — Z79899 Other long term (current) drug therapy: Secondary | ICD-10-CM | POA: Diagnosis not present

## 2015-05-25 DIAGNOSIS — F411 Generalized anxiety disorder: Secondary | ICD-10-CM | POA: Insufficient documentation

## 2015-05-25 DIAGNOSIS — L233 Allergic contact dermatitis due to drugs in contact with skin: Secondary | ICD-10-CM | POA: Insufficient documentation

## 2015-05-25 DIAGNOSIS — Z792 Long term (current) use of antibiotics: Secondary | ICD-10-CM | POA: Diagnosis not present

## 2015-05-25 DIAGNOSIS — Z791 Long term (current) use of non-steroidal anti-inflammatories (NSAID): Secondary | ICD-10-CM | POA: Insufficient documentation

## 2015-05-25 DIAGNOSIS — Z86018 Personal history of other benign neoplasm: Secondary | ICD-10-CM | POA: Diagnosis not present

## 2015-05-25 DIAGNOSIS — Z87891 Personal history of nicotine dependence: Secondary | ICD-10-CM | POA: Diagnosis not present

## 2015-05-25 DIAGNOSIS — Z880 Allergy status to penicillin: Secondary | ICD-10-CM | POA: Insufficient documentation

## 2015-05-25 DIAGNOSIS — E669 Obesity, unspecified: Secondary | ICD-10-CM | POA: Diagnosis not present

## 2015-05-25 DIAGNOSIS — J45909 Unspecified asthma, uncomplicated: Secondary | ICD-10-CM | POA: Diagnosis not present

## 2015-05-25 DIAGNOSIS — Z8719 Personal history of other diseases of the digestive system: Secondary | ICD-10-CM | POA: Insufficient documentation

## 2015-05-25 DIAGNOSIS — Z4800 Encounter for change or removal of nonsurgical wound dressing: Secondary | ICD-10-CM | POA: Insufficient documentation

## 2015-05-25 DIAGNOSIS — L259 Unspecified contact dermatitis, unspecified cause: Secondary | ICD-10-CM

## 2015-05-25 DIAGNOSIS — E0590 Thyrotoxicosis, unspecified without thyrotoxic crisis or storm: Secondary | ICD-10-CM | POA: Insufficient documentation

## 2015-05-25 DIAGNOSIS — T7840XA Allergy, unspecified, initial encounter: Secondary | ICD-10-CM

## 2015-05-25 DIAGNOSIS — Z5189 Encounter for other specified aftercare: Secondary | ICD-10-CM

## 2015-05-25 MED ORDER — TRIAMCINOLONE ACETONIDE 0.5 % EX OINT: 1.0000 "application " | TOPICAL_OINTMENT | Freq: Two times a day (BID) | CUTANEOUS | Status: DC
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
## 2015-05-25 NOTE — Discharge Instructions (Signed)
Please follow with your primary care doctor in the next 2 days for a check-up. They must obtain records for further management.  ° °Do not hesitate to return to the Emergency Department for any new, worsening or concerning symptoms.  ° °

## 2015-06-27 ENCOUNTER — Other Ambulatory Visit: Payer: Self-pay | Admitting: Endocrinology

## 2015-06-28 ENCOUNTER — Ambulatory Visit: Payer: Medicaid Other | Admitting: Adult Health

## 2015-06-29 ENCOUNTER — Telehealth: Payer: Self-pay | Admitting: Endocrinology

## 2015-06-29 MED ORDER — PROPYLTHIOURACIL 50 MG PO TABS: 50.0000 mg | ORAL_TABLET | Freq: Every day | ORAL | Status: DC

## 2015-06-29 NOTE — Telephone Encounter (Signed)
Rx submitted per pt's request.  

## 2015-06-29 NOTE — Telephone Encounter (Signed)
Pt needs refill on thyroid med she is not feeling well today and had to cancel her appt, will call back to reschedule please call refill into [facility]

## 2015-07-03 ENCOUNTER — Emergency Department (INDEPENDENT_AMBULATORY_CARE_PROVIDER_SITE_OTHER)
Admission: EM | Admit: 2015-07-03 | Discharge: 2015-07-03 | Disposition: A | Discharge status: Home / Self Care | Payer: Self-pay | Source: Home / Self Care | Attending: Family Medicine | Admitting: Family Medicine

## 2015-07-03 ENCOUNTER — Encounter (HOSPITAL_COMMUNITY): Payer: Self-pay | Admitting: Emergency Medicine

## 2015-07-03 DIAGNOSIS — M545 Low back pain: Secondary | ICD-10-CM

## 2015-07-03 MED ORDER — NAPROXEN 500 MG PO TABS: 500.0000 mg | ORAL_TABLET | Freq: Two times a day (BID) | ORAL | Status: DC | PRN

## 2015-07-03 MED ORDER — KETOROLAC TROMETHAMINE 30 MG/ML IJ SOLN
30.0000 mg | Freq: Once | INTRAMUSCULAR | Status: AC
  Administered 2015-07-03: 30 mg via INTRAMUSCULAR

## 2015-07-03 MED ORDER — CYCLOBENZAPRINE HCL 5 MG PO TABS: 5.0000 mg | ORAL_TABLET | Freq: Two times a day (BID) | ORAL | Status: DC | PRN

## 2015-07-03 MED ORDER — KETOROLAC TROMETHAMINE 30 MG/ML IJ SOLN
INTRAMUSCULAR | Status: AC
  Filled 2015-07-03: qty 1

## 2015-07-03 NOTE — Discharge Instructions (Signed)
It was nice seeing you today. I am sorry you are having back pain. These could be due to various reasons such as disc herniation, arthritis and over use.  Please avoid lifting heavy objects for now. See your PCP tomorrow especially if no improvement so she can send you for xray or MRI of your back. She might also refer you for physical therapy. If symptoms worsens tonight please go to the ED.   Back Pain, Adult Back pain is very common in adults.The cause of back pain is rarely dangerous and the pain often gets better over time.The cause of your back pain may not be known. Some common causes of back pain include:  Strain of the muscles or ligaments supporting the spine.  Wear and tear (degeneration) of the spinal disks.  Arthritis.  Direct injury to the back. For many people, back pain may return. Since back pain is rarely dangerous, most people can learn to manage this condition on their own. HOME CARE INSTRUCTIONS Watch your back pain for any changes. The following actions may help to lessen any discomfort you are feeling:  Remain active. It is stressful on your back to sit or stand in one place for long periods of time. Do not sit, drive, or stand in one place for more than 30 minutes at a time. Take short walks on even surfaces as soon as you are able.Try to increase the length of time you walk each day.  Exercise regularly as directed by your health care provider. Exercise helps your back heal faster. It also helps avoid future injury by keeping your muscles strong and flexible.  Do not stay in bed.Resting more than 1-2 days can delay your recovery.  Pay attention to your body when you bend and lift. The most comfortable positions are those that put less stress on your recovering back. Always use proper lifting techniques, including:  Bending your knees.  Keeping the load close to your body.  Avoiding twisting.  Find a comfortable position to sleep. Use a firm mattress and lie  on your side with your knees slightly bent. If you lie on your back, put a pillow under your knees.  Avoid feeling anxious or stressed.Stress increases muscle tension and can worsen back pain.It is important to recognize when you are anxious or stressed and learn ways to manage it, such as with exercise.  Take medicines only as directed by your health care provider. Over-the-counter medicines to reduce pain and inflammation are often the most helpful.Your health care provider may prescribe muscle relaxant drugs.These medicines help dull your pain so you can more quickly return to your normal activities and healthy exercise.  Apply ice to the injured area:  Put ice in a plastic bag.  Place a towel between your skin and the bag.  Leave the ice on for 20 minutes, 2-3 times a day for the first 2-3 days. After that, ice and heat may be alternated to reduce pain and spasms.  Maintain a healthy weight. Excess weight puts extra stress on your back and makes it difficult to maintain good posture. SEEK MEDICAL CARE IF:  You have pain that is not relieved with rest or medicine.  You have increasing pain going down into the legs or buttocks.  You have pain that does not improve in one week.  You have night pain.  You lose weight.  You have a fever or chills. SEEK IMMEDIATE MEDICAL CARE IF:   You develop new bowel or bladder control  problems.  You have unusual weakness or numbness in your arms or legs.  You develop nausea or vomiting.  You develop abdominal pain.  You feel faint.   This information is not intended to replace advice given to you by your health care provider. Make sure you discuss any questions you have with your health care provider.   Document Released: 05/15/2005 Document Revised: 06/05/2014 Document Reviewed: 09/16/2013 Elsevier Interactive Patient Education [employer].

## 2015-07-03 NOTE — ED Provider Notes (Signed)
CSN: [id number]     Arrival date & time 07/03/15  1343 History   First MD Initiated Contact with Patient 07/03/15 1451     Chief Complaint  Patient presents with  . Back Pain   (Consider location/radiation/quality/duration/timing/severity/associated sxs/prior Treatment) Patient is a 40 y.o. female presenting with back pain. The history is provided by the patient. No language interpreter was used.  Back Pain Location:  Lumbar spine Quality:  Aching (with spasm) Radiates to:  Does not radiate Pain severity:  Severe Pain is:  Same all the time Onset quality:  Gradual Duration:  3 days (On and off for 2 years ago but worsened 3 days ago.) Timing:  Constant Progression:  Worsening Chronicity:  Recurrent Context: not falling and not recent injury   Context comment:  Lift boxes at the freezer Relieved by:  Nothing Worsened by:  Movement Ineffective treatments: Morbic. Associated symptoms: numbness   Associated symptoms: no abdominal pain, no abdominal swelling, no bladder incontinence, no bowel incontinence, no fever, no leg pain, no paresthesias and no weakness   Associated symptoms comment:  Denies numbness now but at times she will get numb if the pain is severe Risk factors: no hx of cancer, no lack of exercise and no recent surgery     Past Medical History  Diagnosis Date  . HYPERTHYROIDISM 07/20/2009  . DEPRESSION, MAJOR, RECURRENT 07/26/2006  . ANXIETY 07/26/2006  . ASTHMA, INTERMITTENT 07/26/2006  . HYPERGLYCEMIA 09/28/2009  . CONSTIPATION, CHRONIC 06/22/2010  . Dizziness 11/09/2010  . OBESITY 01/31/2008  . Fibroid    Past Surgical History  Procedure Laterality Date  . Tubal ligation  2008  . Iud removal  10/07    Mirena removed 05/10/2004  . Pilonidal cyst excision    . Vaginal delivery      X 4  . Vaginal hysterectomy N/A 07/07/2013    Procedure: HYSTERECTOMY VAGINAL;  Surgeon: Donnamae Jude, MD;  Location: [facility];  Service: Gynecology;  Laterality: N/A;  . Bilateral  salpingectomy Bilateral 07/07/2013    Procedure: BILATERAL SALPINGECTOMY;  Surgeon: Donnamae Jude, MD;  Location: [facility];  Service: Gynecology;  Laterality: Bilateral;   Family History  Problem Relation Age of Onset  . Hypertension Other   . Breast cancer Mother   . Hypertension Mother   . Diabetes Mother   . Hypertension Father   . Diabetes Father   . Breast cancer Maternal Aunt   . Ovarian cancer Maternal Aunt   . Stomach cancer Paternal Grandmother   . Hypertension Sister    Social History  Substance Use Topics  . Smoking status: Former Smoker    Quit date: 11/08/2005  . Smokeless tobacco: Never Used  . Alcohol Use: No   OB History    Gravida Para Term Preterm AB TAB SAB Ectopic Multiple Living   4 4 4       4      Review of Systems  Constitutional: Negative for fever.  Respiratory: Negative.   Cardiovascular: Negative.   Gastrointestinal: Negative for abdominal pain and bowel incontinence.  Genitourinary: Negative for bladder incontinence.  Musculoskeletal: Positive for back pain.  Neurological: Positive for numbness. Negative for weakness and paresthesias.  All other systems reviewed and are negative.   Allergies  Penicillins and Bacitracin-lidocaine  Home Medications   Prior to Admission medications   Medication Sig Start Date End Date Taking? Authorizing Provider  albuterol (PROVENTIL HFA;VENTOLIN HFA) 108 (90 BASE) MCG/ACT inhaler Inhale 2 puffs into the lungs every 6 (six)  hours as needed for wheezing. 06/08/14   Donnamae Jude, MD  busPIRone (BUSPAR) 5 MG tablet Take 1 tablet (5 mg total) by mouth 3 (three) times daily as needed. 04/09/15   Arnoldo Morale, MD  cholecalciferol (VITAMIN D) 1000 UNITS tablet Take 1,000 Units by mouth daily.     Historical Provider, MD  doxycycline (VIBRAMYCIN) 100 MG capsule Take 1 capsule (100 mg total) by mouth 2 (two) times daily. 04/15/15   Billy Fischer, MD  ipratropium (ATROVENT) 0.06 % nasal spray Place 2 sprays into both  nostrils 4 (four) times daily. 04/15/15   Billy Fischer, MD  meloxicam (MOBIC) 7.5 MG tablet Take 1 tablet (7.5 mg total) by mouth daily. 04/09/15   Arnoldo Morale, MD  midodrine (PROAMATINE) 5 MG tablet Take 1 tablet (5 mg total) by mouth 3 (three) times daily with meals. 05/19/15   Arnoldo Morale, MD  propylthiouracil (PTU) 50 MG tablet Take 1 tablet (50 mg total) by mouth daily. 06/29/15   Renato Shin, MD  triamcinolone ointment (KENALOG) 0.5 % Apply 1 application topically 2 (two) times daily. 05/25/15   Elmyra Ricks Pisciotta, PA-C   Meds Ordered and Administered this Visit  Medications - No data to display  BP 115/76 mmHg  Pulse 74  Temp(Src) 97.8 F (36.6 C) (Oral)  SpO2 98%  LMP 06/17/2013 No data found.    Physical Exam  Constitutional: She appears well-developed. No distress.  Cardiovascular: Normal rate, regular rhythm and normal heart sounds.   No murmur heard. Pulmonary/Chest: Effort normal and breath sounds normal. No respiratory distress. She has no wheezes.  Musculoskeletal:       Lumbar back: She exhibits decreased range of motion, tenderness and spasm. She exhibits no swelling, no edema and no deformity.  Neurological: She is alert. She has normal strength. No cranial nerve deficit or sensory deficit.  Nursing note and vitals reviewed.   ED Course  Procedures (including critical care time)  Labs Review Labs Reviewed - No data to display  Imaging Review No results found.   Visual Acuity Review  Right Eye Distance:   Left Eye Distance:   Bilateral Distance:    Right Eye Near:   Left Eye Near:    Bilateral Near:         MDM  No diagnosis found. Low back pain, unspecified back pain laterality, with sciatica presence unspecified   Patient with worsening low back pain, differentials include disc hernia given hx of lifting heavy object vs osteoarthritis. Toradol 30 mg IM X 1 dose given today. She was discharged home on naproxen 500mg  prn pain as well as  flexeril. I recommended PCP follow up tomorrow for xray/MRI referral especially if no improvement as well as to refer to PT. Return precaution and red flags discussed. F/U as needed.   Kinnie Feil, MD 07/03/15 [phone]

## 2015-07-03 NOTE — ED Notes (Signed)
Pt here with intermit back spasms and diff ambulation due to pain  Started 3 days ago, using  Salon pos patches with some relief

## 2015-07-05 ENCOUNTER — Telehealth: Payer: Self-pay | Admitting: Family Medicine

## 2015-07-05 NOTE — Telephone Encounter (Signed)
Pt. Was seen at Urgent Care and was advised to get a referral for an MRI from PCP due to her low back pain....please advised patient

## 2015-07-05 NOTE — Telephone Encounter (Signed)
Patient called and explained that MD would like to see her for follow up on her back.  Transferred to [facility] to make appointment. Patient in agreement.

## 2015-07-12 ENCOUNTER — Ambulatory Visit (HOSPITAL_BASED_OUTPATIENT_CLINIC_OR_DEPARTMENT_OTHER): Payer: Medicaid Other | Admitting: Family Medicine

## 2015-07-12 ENCOUNTER — Encounter: Payer: Self-pay | Admitting: Family Medicine

## 2015-07-12 VITALS — BP 119/83 | HR 60 | Temp 98.1°F | Resp 15 | Ht 71.0 in | Wt 221.4 lb

## 2015-07-12 DIAGNOSIS — Z131 Encounter for screening for diabetes mellitus: Secondary | ICD-10-CM

## 2015-07-12 LAB — POCT GLYCOSYLATED HEMOGLOBIN (HGB A1C): HEMOGLOBIN A1C: 4.9

## 2015-07-12 NOTE — Progress Notes (Signed)
Patient asking for referral for MRI or persistent back pain She does not complain of pain today however She also has a burn on her left arm that she would like MD to assess.

## 2015-07-20 ENCOUNTER — Ambulatory Visit: Payer: Medicaid Other | Admitting: Endocrinology

## 2015-07-26 ENCOUNTER — Inpatient Hospital Stay: Payer: Self-pay | Admitting: Family Medicine

## 2015-08-14 ENCOUNTER — Emergency Department (INDEPENDENT_AMBULATORY_CARE_PROVIDER_SITE_OTHER)
Admission: EM | Admit: 2015-08-14 | Discharge: 2015-08-14 | Disposition: A | Discharge status: Home / Self Care | Payer: BC Managed Care – PPO | Source: Home / Self Care | Attending: Family Medicine | Admitting: Family Medicine

## 2015-08-14 ENCOUNTER — Encounter (HOSPITAL_COMMUNITY): Payer: Self-pay | Admitting: *Deleted

## 2015-08-14 DIAGNOSIS — B349 Viral infection, unspecified: Secondary | ICD-10-CM | POA: Diagnosis not present

## 2015-08-14 MED ORDER — ALBUTEROL SULFATE HFA 108 (90 BASE) MCG/ACT IN AERS: 2.0000 | INHALATION_SPRAY | RESPIRATORY_TRACT | Status: DC | PRN

## 2015-08-14 MED ORDER — IBUPROFEN 800 MG PO TABS
ORAL_TABLET | ORAL | Status: AC
  Filled 2015-08-14: qty 1

## 2015-08-14 MED ORDER — IBUPROFEN 800 MG PO TABS
800.0000 mg | ORAL_TABLET | Freq: Once | ORAL | Status: AC
  Administered 2015-08-14: 800 mg via ORAL

## 2015-08-14 NOTE — Discharge Instructions (Signed)
Viral Infections For nasal and head congestion may take Sudafed PE 10 mg every 4 hours as needed. Saline nasal spray used frequently. For drainage may use Allegra, Claritin or Zyrtec. If you need stronger medicine to stop drainage may take Chlor-Trimeton 2-4 mg every 4 hours. This may cause drowsiness. Ibuprofen 600 mg every 6 hours as needed for pain, discomfort or fever. Drink plenty of fluids and stay well-hydrated. Cepacol lozenges for throat discomfort A viral infection can be caused by different types of viruses.Most viral infections are not serious and resolve on their own. However, some infections may cause severe symptoms and may lead to further complications. SYMPTOMS Viruses can frequently cause:  Minor sore throat.  Aches and pains.  Headaches.  Runny nose.  Different types of rashes.  Watery eyes.  Tiredness.  Cough.  Loss of appetite.  Gastrointestinal infections, resulting in nausea, vomiting, and diarrhea. These symptoms do not respond to antibiotics because the infection is not caused by bacteria. However, you might catch a bacterial infection following the viral infection. This is sometimes called a "superinfection." Symptoms of such a bacterial infection may include:  Worsening sore throat with pus and difficulty swallowing.  Swollen neck glands.  Chills and a high or persistent fever.  Severe headache.  Tenderness over the sinuses.  Persistent overall ill feeling (malaise), muscle aches, and tiredness (fatigue).  Persistent cough.  Yellow, green, or brown mucus production with coughing. HOME CARE INSTRUCTIONS   Only take over-the-counter or prescription medicines for pain, discomfort, diarrhea, or fever as directed by your caregiver.  Drink enough water and fluids to keep your urine clear or pale yellow. Sports drinks can provide valuable electrolytes, sugars, and hydration.  Get plenty of rest and maintain proper nutrition. Soups and broths  with crackers or rice are fine. SEEK IMMEDIATE MEDICAL CARE IF:   You have severe headaches, shortness of breath, chest pain, neck pain, or an unusual rash.  You have uncontrolled vomiting, diarrhea, or you are unable to keep down fluids.  You or your child has an oral temperature above 102 F (38.9 C), not controlled by medicine.  Your baby is older than 3 months with a rectal temperature of 102 F (38.9 C) or higher.  Your baby is 45 months old or younger with a rectal temperature of 100.4 F (38 C) or higher. MAKE SURE YOU:   Understand these instructions.  Will watch your condition.  Will get help right away if you are not doing well or get worse.   This information is not intended to replace advice given to you by your health care provider. Make sure you discuss any questions you have with your health care provider.   Document Released: 02/22/2005 Document Revised: 08/07/2011 Document Reviewed: 10/21/2014 Elsevier Interactive Patient Education [employer].

## 2015-08-14 NOTE — ED Notes (Signed)
C/O productive cough, hoarse voice, chest pain & left post rib cage pain with inspiration and coughing, coughing fits, right earache, body aches x 3 days.  Unsure if fevers.  Cannot find her albuterol inhaler.  Has been taking ASA (none today).

## 2015-08-14 NOTE — ED Provider Notes (Signed)
CSN: [id number]     Arrival date & time 08/14/15  1301 History   First MD Initiated Contact with Patient 08/14/15 1307     Chief Complaint  Patient presents with  . Cough  . URI   (Consider location/radiation/quality/duration/timing/severity/associated sxs/prior Treatment) HPI Comments: 40 year old female complaining of a 3 day history of laryngitis, PND, cough, body aches, runny nose, chills and sweats, feeling warm but no documented fever,myalgias, fatigue and malaise. Her only medication has been aspirin.  Patient is a 40 y.o. female presenting with cough and URI.  Cough Associated symptoms: chills, diaphoresis, myalgias, rhinorrhea and sore throat   Associated symptoms: no fever, no rash and no shortness of breath   URI Presenting symptoms: congestion, cough, fatigue, rhinorrhea and sore throat   Presenting symptoms: no fever   Associated symptoms: myalgias   Associated symptoms: no neck pain     Past Medical History  Diagnosis Date  . HYPERTHYROIDISM 07/20/2009  . DEPRESSION, MAJOR, RECURRENT 07/26/2006  . ANXIETY 07/26/2006  . ASTHMA, INTERMITTENT 07/26/2006  . HYPERGLYCEMIA 09/28/2009  . CONSTIPATION, CHRONIC 06/22/2010  . Dizziness 11/09/2010  . OBESITY 01/31/2008  . Fibroid    Past Surgical History  Procedure Laterality Date  . Tubal ligation  2008  . Iud removal  10/07    Mirena removed 05/10/2004  . Pilonidal cyst excision    . Vaginal delivery      X 4  . Vaginal hysterectomy N/A 07/07/2013    Procedure: HYSTERECTOMY VAGINAL;  Surgeon: Donnamae Jude, MD;  Location: [facility];  Service: Gynecology;  Laterality: N/A;  . Bilateral salpingectomy Bilateral 07/07/2013    Procedure: BILATERAL SALPINGECTOMY;  Surgeon: Donnamae Jude, MD;  Location: [facility];  Service: Gynecology;  Laterality: Bilateral;   Family History  Problem Relation Age of Onset  . Hypertension Other   . Breast cancer Mother   . Hypertension Mother   . Diabetes Mother   . Hypertension Father   . Diabetes  Father   . Breast cancer Maternal Aunt   . Ovarian cancer Maternal Aunt   . Stomach cancer Paternal Grandmother   . Hypertension Sister    Social History  Substance Use Topics  . Smoking status: Former Smoker    Quit date: 11/08/2005  . Smokeless tobacco: Never Used  . Alcohol Use: No   OB History    Gravida Para Term Preterm AB TAB SAB Ectopic Multiple Living   4 4 4       4      Review of Systems  Constitutional: Positive for chills, diaphoresis and fatigue. Negative for fever, activity change and appetite change.  HENT: Positive for congestion, postnasal drip, rhinorrhea, sore throat and voice change. Negative for facial swelling and trouble swallowing.   Eyes: Negative.   Respiratory: Positive for cough. Negative for chest tightness and shortness of breath.   Cardiovascular: Negative.   Gastrointestinal: Negative.   Genitourinary: Negative.   Musculoskeletal: Positive for myalgias and back pain. Negative for neck pain and neck stiffness.  Skin: Negative for pallor and rash.  Neurological: Negative.   Hematological: Negative.   Psychiatric/Behavioral: Negative.     Allergies  Penicillins and Bacitracin-lidocaine  Home Medications   Prior to Admission medications   Medication Sig Start Date End Date Taking? Authorizing Provider  albuterol (PROVENTIL HFA;VENTOLIN HFA) 108 (90 BASE) MCG/ACT inhaler Inhale 2 puffs into the lungs every 6 (six) hours as needed for wheezing. 06/08/14  Yes Donnamae Jude, MD  midodrine (PROAMATINE) 5 MG tablet  Take 1 tablet (5 mg total) by mouth 3 (three) times daily with meals. 05/19/15  Yes Arnoldo Morale, MD  propylthiouracil (PTU) 50 MG tablet Take 1 tablet (50 mg total) by mouth daily. 06/29/15  Yes Renato Shin, MD  busPIRone (BUSPAR) 5 MG tablet Take 1 tablet (5 mg total) by mouth 3 (three) times daily as needed. 04/09/15   Arnoldo Morale, MD  cyclobenzaprine (FLEXERIL) 5 MG tablet Take 1 tablet (5 mg total) by mouth 2 (two) times daily as  needed for muscle spasms. 07/03/15   Kinnie Feil, MD  ipratropium (ATROVENT) 0.06 % nasal spray Place 2 sprays into both nostrils 4 (four) times daily. 04/15/15   Billy Fischer, MD  meloxicam (MOBIC) 7.5 MG tablet Take 1 tablet (7.5 mg total) by mouth daily. 04/09/15   Arnoldo Morale, MD  naproxen (NAPROSYN) 500 MG tablet Take 1 tablet (500 mg total) by mouth 2 (two) times daily as needed. 07/03/15   Kinnie Feil, MD  triamcinolone ointment (KENALOG) 0.5 % Apply 1 application topically 2 (two) times daily. 05/25/15   Elmyra Ricks Pisciotta, PA-C   Meds Ordered and Administered this Visit   Medications  ibuprofen (ADVIL,MOTRIN) tablet 800 mg (800 mg Oral Given 08/14/15 1320)    BP 123/78 mmHg  Pulse 80  Temp(Src) 98.3 F (36.8 C) (Oral)  Resp 14  SpO2 97%  LMP 06/17/2013 No data found.   Physical Exam  Constitutional: She is oriented to person, place, and time. She appears well-developed and well-nourished. No distress.  HENT:  Mouth/Throat: No oropharyngeal exudate.  Bilateral TMs are normal. Oropharynx with cobblestoning, minor erythema and moderate amount of clear PND.  Eyes: Conjunctivae and EOM are normal.  Neck: Normal range of motion. Neck supple.  Cardiovascular: Normal rate, regular rhythm and normal heart sounds.   Pulmonary/Chest: Effort normal and breath sounds normal. No respiratory distress. She has no wheezes. She has no rales. She exhibits tenderness.  There is tenderness to the left lateral costal margin and ribs. This pain is exacerbated or elicited with cough or deep palpation. Lungs perfectly clear, no wheezing or crackles.  Abdominal: Soft. There is no tenderness. There is no rebound.  Musculoskeletal: Normal range of motion. She exhibits no edema.  Lymphadenopathy:    She has no cervical adenopathy.  Neurological: She is alert and oriented to person, place, and time. No cranial nerve deficit.  Skin: Skin is warm and dry. No rash noted.  Psychiatric: She has a  normal mood and affect.  Nursing note and vitals reviewed.   ED Course  Procedures (including critical care time)  Labs Review Labs Reviewed - No data to display  Imaging Review No results found.   Visual Acuity Review  Right Eye Distance:   Left Eye Distance:   Bilateral Distance:    Right Eye Near:   Left Eye Near:    Bilateral Near:         MDM   1. Viral syndrome    Viral Infections For nasal and head congestion may take Sudafed PE 10 mg every 4 hours as needed. Saline nasal spray used frequently. For drainage may use Allegra, Claritin or Zyrtec. If you need stronger medicine to stop drainage may take Chlor-Trimeton 2-4 mg every 4 hours. This may cause drowsiness. Ibuprofen 600 mg every 6 hours as needed for pain, discomfort or fever. Drink plenty of fluids and stay well-hydrated. Cepacol lozenges for throat discomfort    Janne Napoleon, NP 08/14/15 [place],  NP 08/14/15 1330

## 2015-08-16 ENCOUNTER — Emergency Department (INDEPENDENT_AMBULATORY_CARE_PROVIDER_SITE_OTHER)
Admission: EM | Admit: 2015-08-16 | Discharge: 2015-08-16 | Disposition: A | Discharge status: Home / Self Care | Payer: BC Managed Care – PPO | Source: Home / Self Care | Attending: Family Medicine | Admitting: Family Medicine

## 2015-08-16 ENCOUNTER — Encounter (HOSPITAL_COMMUNITY): Payer: Self-pay | Admitting: Emergency Medicine

## 2015-08-16 DIAGNOSIS — J111 Influenza due to unidentified influenza virus with other respiratory manifestations: Secondary | ICD-10-CM | POA: Diagnosis not present

## 2015-08-16 NOTE — ED Notes (Signed)
Patient reports for recheck of symptoms. Has not gotten any better with any current therapy.

## 2015-08-16 NOTE — Discharge Instructions (Signed)

## 2015-08-16 NOTE — ED Provider Notes (Signed)
CSN: [id number]     Arrival date & time 08/16/15  1304 History   First MD Initiated Contact with Patient 08/16/15 1404     Chief Complaint  Patient presents with  . Influenza   (Consider location/radiation/quality/duration/timing/severity/associated sxs/prior Treatment) HPI Pt presents with sore throat, body aches, fever, chills for 5 days Home treatment has been OTC meds without much relief of symptoms Fever is improved for short periods of time with OTC antipyretics. Pain score is 4 mostly from coughing and body aches Taking fluids, no appetite Was seen at [facility] Saturday for similar symptoms, not any better OTC use episodically Has been exposed to others with similar symptoms.  Denies: CP, SOB, vomiting or diarrhea.  Past Medical History  Diagnosis Date  . HYPERTHYROIDISM 07/20/2009  . DEPRESSION, MAJOR, RECURRENT 07/26/2006  . ANXIETY 07/26/2006  . ASTHMA, INTERMITTENT 07/26/2006  . HYPERGLYCEMIA 09/28/2009  . CONSTIPATION, CHRONIC 06/22/2010  . Dizziness 11/09/2010  . OBESITY 01/31/2008  . Fibroid    Past Surgical History  Procedure Laterality Date  . Tubal ligation  2008  . Iud removal  10/07    Mirena removed 05/10/2004  . Pilonidal cyst excision    . Vaginal delivery      X 4  . Vaginal hysterectomy N/A 07/07/2013    Procedure: HYSTERECTOMY VAGINAL;  Surgeon: Donnamae Jude, MD;  Location: [place];  Service: Gynecology;  Laterality: N/A;  . Bilateral salpingectomy Bilateral 07/07/2013    Procedure: BILATERAL SALPINGECTOMY;  Surgeon: Donnamae Jude, MD;  Location: [place] [facility];  Service: Gynecology;  Laterality: Bilateral;   Family History  Problem Relation Age of Onset  . Hypertension Other   . Breast cancer Mother   . Hypertension Mother   . Diabetes Mother   . Hypertension Father   . Diabetes Father   . Breast cancer Maternal Aunt   . Ovarian cancer Maternal Aunt   . Stomach cancer Paternal Grandmother   . Hypertension Sister    Social History  Substance Use Topics  .  Smoking status: Former Smoker    Quit date: 11/08/2005  . Smokeless tobacco: Never Used  . Alcohol Use: No   OB History    Gravida Para Term Preterm AB TAB SAB Ectopic Multiple Living   4 4 4       4      Review of Systems See HPI Allergies  Penicillins and Bacitracin-lidocaine  Home Medications   Prior to Admission medications   Medication Sig Start Date End Date Taking? Authorizing Provider  albuterol (PROVENTIL HFA;VENTOLIN HFA) 108 (90 Base) MCG/ACT inhaler Inhale 2 puffs into the lungs every 4 (four) hours as needed for wheezing or shortness of breath. 08/14/15   Janne Napoleon, NP  midodrine (PROAMATINE) 5 MG tablet Take 1 tablet (5 mg total) by mouth 3 (three) times daily with meals. 05/19/15   Arnoldo Morale, MD  propylthiouracil (PTU) 50 MG tablet Take 1 tablet (50 mg total) by mouth daily. 06/29/15   Renato Shin, MD  triamcinolone ointment (KENALOG) 0.5 % Apply 1 application topically 2 (two) times daily. 05/25/15   Elmyra Ricks Pisciotta, PA-C   Meds Ordered and Administered this Visit  Medications - No data to display  BP 112/64 mmHg  Pulse 68  Temp(Src) 98.8 F (37.1 C) (Oral)  Resp 16  SpO2 99%  LMP 06/17/2013 No data found.   Physical Exam NURSES NOTES AND VITAL SIGNS REVIEWED. CONSTITUTIONAL: Well developed, well nourished, no acute distress HEENT: normocephalic, atraumatic, right and left TM's are normal  EYES: Conjunctiva normal NECK:normal ROM, supple, no adenopathy PULMONARY:No respiratory distress, normal effort, Lungs: CTAb/l, no wheezes, or increased work of breathing CARDIOVASCULAR: RRR, no murmur ABDOMEN: soft, ND, NT, +'ve BS MUSCULOSKELETAL: Normal ROM of all extremities,  SKIN: warm and dry without rash PSYCHIATRIC: Mood and affect, behavior are normal  ED Course  Procedures (including critical care time)  Labs Review Labs Reviewed - No data to display  Imaging Review No results found.   Visual Acuity Review  Right Eye Distance:   Left Eye  Distance:   Bilateral Distance:    Right Eye Near:   Left Eye Near:    Bilateral Near:       Pt was in [facility] Saturday. Not feeling any better, was told she has flu like symptoms. I have agreed with that assessment, since we do not do flu swabs, this is a clinical diagnosis.  Treatment will be the same with or without flu test.  Pt well outside therapeutic treatment window for tamiflu.  MDM   1. Flu     Patient is reassured that there are no issues that require transfer to higher level of care at this time or additional tests. Patient is advised to continue home symptomatic treatment. Patient is advised that if there are new or worsening symptoms to attend the emergency department, contact primary care provider, or return to [facility]. Instructions of care provided discharged home in stable condition. Return to work/school note provided.   THIS NOTE WAS GENERATED USING A VOICE RECOGNITION SOFTWARE PROGRAM. ALL REASONABLE EFFORTS  WERE MADE TO PROOFREAD THIS DOCUMENT FOR ACCURACY.  I have verbally reviewed the discharge instructions with the patient. A printed AVS was given to the patient.  All questions were answered prior to discharge.      Konrad Felix, [place] 08/16/15 1444

## 2015-08-21 ENCOUNTER — Encounter (HOSPITAL_COMMUNITY): Payer: Self-pay | Admitting: Emergency Medicine

## 2015-08-21 ENCOUNTER — Emergency Department (INDEPENDENT_AMBULATORY_CARE_PROVIDER_SITE_OTHER)
Admission: EM | Admit: 2015-08-21 | Discharge: 2015-08-21 | Disposition: A | Discharge status: Home / Self Care | Payer: BC Managed Care – PPO | Source: Home / Self Care | Attending: Family Medicine | Admitting: Family Medicine

## 2015-08-21 DIAGNOSIS — J3489 Other specified disorders of nose and nasal sinuses: Secondary | ICD-10-CM | POA: Diagnosis not present

## 2015-08-21 DIAGNOSIS — J302 Other seasonal allergic rhinitis: Secondary | ICD-10-CM | POA: Diagnosis not present

## 2015-08-21 DIAGNOSIS — J069 Acute upper respiratory infection, unspecified: Secondary | ICD-10-CM | POA: Diagnosis not present

## 2015-08-21 DIAGNOSIS — R0982 Postnasal drip: Secondary | ICD-10-CM

## 2015-08-21 NOTE — Discharge Instructions (Signed)
Allergic Rhinitis Initially your symptoms were typical of a viral infection. It appears now that the remaining symptoms are consistent with allergic rhinitis with postnasal drainage and a nasal drainage. Immune system has been working overtime for the past couple weeks and that is likely the reason you are continuing to feel tired. The sure to drink plenty of fluids and stay well-hydrated He may want to change from Zyrtec to Allegra Use plenty of saline nasal spray continue with Tylenol or ibuprofen as needed.  Allergic rhinitis is when the mucous membranes in the nose respond to allergens. Allergens are particles in the air that cause your body to have an allergic reaction. This causes you to release allergic antibodies. Through a chain of events, these eventually cause you to release histamine into the blood stream. Although meant to protect the body, it is this release of histamine that causes your discomfort, such as frequent sneezing, congestion, and an itchy, runny nose.  CAUSES Seasonal allergic rhinitis (hay fever) is caused by pollen allergens that may come from grasses, trees, and weeds. Year-round allergic rhinitis (perennial allergic rhinitis) is caused by allergens such as house dust mites, pet dander, and mold spores. SYMPTOMS  Nasal stuffiness (congestion).  Itchy, runny nose with sneezing and tearing of the eyes. DIAGNOSIS Your health care provider can help you determine the allergen or allergens that trigger your symptoms. If you and your health care provider are unable to determine the allergen, skin or blood testing may be used. Your health care provider will diagnose your condition after taking your health history and performing a physical exam. Your health care provider may assess you for other related conditions, such as asthma, pink eye, or an ear infection. TREATMENT Allergic rhinitis does not have a cure, but it can be controlled by:  Medicines that block allergy symptoms.  These may include allergy shots, nasal sprays, and oral antihistamines.  Avoiding the allergen. Hay fever may often be treated with antihistamines in pill or nasal spray forms. Antihistamines block the effects of histamine. There are over-the-counter medicines that may help with nasal congestion and swelling around the eyes. Check with your health care provider before taking or giving this medicine. If avoiding the allergen or the medicine prescribed do not work, there are many new medicines your health care provider can prescribe. Stronger medicine may be used if initial measures are ineffective. Desensitizing injections can be used if medicine and avoidance does not work. Desensitization is when a patient is given ongoing shots until the body becomes less sensitive to the allergen. Make sure you follow up with your health care provider if problems continue. HOME CARE INSTRUCTIONS It is not possible to completely avoid allergens, but you can reduce your symptoms by taking steps to limit your exposure to them. It helps to know exactly what you are allergic to so that you can avoid your specific triggers. SEEK MEDICAL CARE IF:  You have a fever.  You develop a cough that does not stop easily (persistent).  You have shortness of breath.  You start wheezing.  Symptoms interfere with normal daily activities.   This information is not intended to replace advice given to you by your health care provider. Make sure you discuss any questions you have with your health care provider.   Document Released: 02/07/2001 Document Revised: 06/05/2014 Document Reviewed: 01/20/2013 Elsevier Interactive Patient Education 2016 Elsevier Inc[employer]  Upper Respiratory Infection, Adult Most upper respiratory infections (URIs) are a viral infection of the air passages leading  to the lungs. A URI affects the nose, throat, and upper air passages. The most common type of URI is nasopharyngitis and is typically referred to as  "the common cold." URIs run their course and usually go away on their own. Most of the time, a URI does not require medical attention, but sometimes a bacterial infection in the upper airways can follow a viral infection. This is called a secondary infection. Sinus and middle ear infections are common types of secondary upper respiratory infections. Bacterial pneumonia can also complicate a URI. A URI can worsen asthma and chronic obstructive pulmonary disease (COPD). Sometimes, these complications can require emergency medical care and may be life threatening.  CAUSES Almost all URIs are caused by viruses. A virus is a type of germ and can spread from one person to another.  RISKS FACTORS You may be at risk for a URI if:   You smoke.   You have chronic heart or lung disease.  You have a weakened defense (immune) system.   You are very young or very old.   You have nasal allergies or asthma.  You work in crowded or poorly ventilated areas.  You work in health care facilities or schools. SIGNS AND SYMPTOMS  Symptoms typically develop 2-3 days after you come in contact with a cold virus. Most viral URIs last 7-10 days. However, viral URIs from the influenza virus (flu virus) can last 14-18 days and are typically more severe. Symptoms may include:   Runny or stuffy (congested) nose.   Sneezing.   Cough.   Sore throat.   Headache.   Fatigue.   Fever.   Loss of appetite.   Pain in your forehead, behind your eyes, and over your cheekbones (sinus pain).  Muscle aches.  DIAGNOSIS  Your health care provider may diagnose a URI by:  Physical exam.  Tests to check that your symptoms are not due to another condition such as:  Strep throat.  Sinusitis.  Pneumonia.  Asthma. TREATMENT  A URI goes away on its own with time. It cannot be cured with medicines, but medicines may be prescribed or recommended to relieve symptoms. Medicines may help:  Reduce your  fever.  Reduce your cough.  Relieve nasal congestion. HOME CARE INSTRUCTIONS   Take medicines only as directed by your health care provider.   Gargle warm saltwater or take cough drops to comfort your throat as directed by your health care provider.  Use a warm mist humidifier or inhale steam from a shower to increase air moisture. This may make it easier to breathe.  Drink enough fluid to keep your urine clear or pale yellow.   Eat soups and other clear broths and maintain good nutrition.   Rest as needed.   Return to work when your temperature has returned to normal or as your health care provider advises. You may need to stay home longer to avoid infecting others. You can also use a face mask and careful hand washing to prevent spread of the virus.  Increase the usage of your inhaler if you have asthma.   Do not use any tobacco products, including cigarettes, chewing tobacco, or electronic cigarettes. If you need help quitting, ask your health care provider. PREVENTION  The best way to protect yourself from getting a cold is to practice good hygiene.   Avoid oral or hand contact with people with cold symptoms.   Wash your hands often if contact occurs.  There is no clear evidence that  vitamin C, vitamin E, echinacea, or exercise reduces the chance of developing a cold. However, it is always recommended to get plenty of rest, exercise, and practice good nutrition.  SEEK MEDICAL CARE IF:   You are getting worse rather than better.   Your symptoms are not controlled by medicine.   You have chills.  You have worsening shortness of breath.  You have brown or red mucus.  You have yellow or brown nasal discharge.  You have pain in your face, especially when you bend forward.  You have a fever.  You have swollen neck glands.  You have pain while swallowing.  You have white areas in the back of your throat. SEEK IMMEDIATE MEDICAL CARE IF:   You have severe  or persistent:  Headache.  Ear pain.  Sinus pain.  Chest pain.  You have chronic lung disease and any of the following:  Wheezing.  Prolonged cough.  Coughing up blood.  A change in your usual mucus.  You have a stiff neck.  You have changes in your:  Vision.  Hearing.  Thinking.  Mood. MAKE SURE YOU:   Understand these instructions.  Will watch your condition.  Will get help right away if you are not doing well or get worse.   This information is not intended to replace advice given to you by your health care provider. Make sure you discuss any questions you have with your health care provider.   Document Released: 11/08/2000 Document Revised: 09/29/2014 Document Reviewed: 08/20/2013 Elsevier Interactive Patient Education [employer].

## 2015-08-21 NOTE — ED Provider Notes (Signed)
CSN: [id number]     Arrival date & time 08/21/15  1301 History   First MD Initiated Contact with Patient 08/21/15 1314     Chief Complaint  Patient presents with  . URI   (Consider location/radiation/quality/duration/timing/severity/associated sxs/prior Treatment) HPI Comments: 40 year old female who is making her third visit to the urgent care for upper respiratory symptoms. She was seen on March 18 and then the 20th for URI symptoms and flulike or viral symptoms. She was treated symptomatically. She states today that much of the symptoms have abated. She is no longer having fevers or chief complaint is that her nose hears and throat feel like needles are inside. She denies PND or runny nose. During the interview she is constantly clearing her throat and sniffling. She occasionally has chills and night sweats. She still continues to feel weak. She is afebrile with normal heart rate. Her cough is minimized. Her medications include Zyrtec and ibuprofen.   Past Medical History  Diagnosis Date  . HYPERTHYROIDISM 07/20/2009  . DEPRESSION, MAJOR, RECURRENT 07/26/2006  . ANXIETY 07/26/2006  . ASTHMA, INTERMITTENT 07/26/2006  . HYPERGLYCEMIA 09/28/2009  . CONSTIPATION, CHRONIC 06/22/2010  . Dizziness 11/09/2010  . OBESITY 01/31/2008  . Fibroid    Past Surgical History  Procedure Laterality Date  . Tubal ligation  2008  . Iud removal  10/07    Mirena removed 05/10/2004  . Pilonidal cyst excision    . Vaginal delivery      X 4  . Vaginal hysterectomy N/A 07/07/2013    Procedure: HYSTERECTOMY VAGINAL;  Surgeon: Donnamae Jude, MD;  Location: [facility];  Service: Gynecology;  Laterality: N/A;  . Bilateral salpingectomy Bilateral 07/07/2013    Procedure: BILATERAL SALPINGECTOMY;  Surgeon: Donnamae Jude, MD;  Location: [facility];  Service: Gynecology;  Laterality: Bilateral;   Family History  Problem Relation Age of Onset  . Hypertension Other   . Breast cancer Mother   . Hypertension Mother   . Diabetes  Mother   . Hypertension Father   . Diabetes Father   . Breast cancer Maternal Aunt   . Ovarian cancer Maternal Aunt   . Stomach cancer Paternal Grandmother   . Hypertension Sister    Social History  Substance Use Topics  . Smoking status: Former Smoker    Quit date: 11/08/2005  . Smokeless tobacco: Never Used  . Alcohol Use: No   OB History    Gravida Para Term Preterm AB TAB SAB Ectopic Multiple Living   4 4 4       4      Review of Systems  Constitutional: Positive for activity change and fatigue. Negative for fever.  HENT: Positive for congestion, ear pain and sore throat. Negative for postnasal drip and rhinorrhea.   Eyes: Negative.   Respiratory: Negative for cough and shortness of breath.   Cardiovascular: Negative for chest pain and leg swelling.  Genitourinary: Negative.   Skin: Negative.   Neurological: Negative.   All other systems reviewed and are negative.   Allergies  Penicillins and Bacitracin-lidocaine  Home Medications   Prior to Admission medications   Medication Sig Start Date End Date Taking? Authorizing Provider  albuterol (PROVENTIL HFA;VENTOLIN HFA) 108 (90 Base) MCG/ACT inhaler Inhale 2 puffs into the lungs every 4 (four) hours as needed for wheezing or shortness of breath. 08/14/15  Yes Janne Napoleon, NP  midodrine (PROAMATINE) 5 MG tablet Take 1 tablet (5 mg total) by mouth 3 (three) times daily with meals. 05/19/15  Yes  Arnoldo Morale, MD  propylthiouracil (PTU) 50 MG tablet Take 1 tablet (50 mg total) by mouth daily. 06/29/15   Renato Shin, MD  triamcinolone ointment (KENALOG) 0.5 % Apply 1 application topically 2 (two) times daily. 05/25/15   Elmyra Ricks Pisciotta, PA-C   Meds Ordered and Administered this Visit  Medications - No data to display  BP 111/78 mmHg  Pulse 86  Temp(Src) 97.7 F (36.5 C) (Oral)  Resp 16  SpO2 98%  LMP 06/17/2013 No data found.   Physical Exam  Constitutional: She is oriented to person, place, and time. She appears  well-developed and well-nourished. No distress.  HENT:  Head: Normocephalic.  Mouth/Throat: No oropharyngeal exudate.  Bilateral TMs are pearly gray, transparent and without erythema, retraction or bulging. Oropharynx with streaky, light erythema and moderate amount of clear PND. Minor cobblestoning. No exudates.  Eyes: EOM are normal.  Neck: Normal range of motion. Neck supple.  Cardiovascular: Normal rate, regular rhythm and normal heart sounds.   Pulmonary/Chest: Effort normal and breath sounds normal. No respiratory distress. She has no wheezes. She has no rales.  Musculoskeletal: Normal range of motion. She exhibits no edema.  Lymphadenopathy:    She has no cervical adenopathy.  Neurological: She is alert and oriented to person, place, and time.  Skin: Skin is warm and dry. No rash noted.  Psychiatric: She has a normal mood and affect.  Nursing note and vitals reviewed.   ED Course  Procedures (including critical care time)  Labs Review Labs Reviewed - No data to display  Imaging Review No results found.   Visual Acuity Review  Right Eye Distance:   Left Eye Distance:   Bilateral Distance:    Right Eye Near:   Left Eye Near:    Bilateral Near:         MDM   1. Other seasonal allergic rhinitis   2. URI (upper respiratory infection)   3. PND (post-nasal drip)   4. Rhinorrhea    Allergic Rhinitis Initially your symptoms were typical of a viral infection. It appears now that the remaining symptoms are consistent with allergic rhinitis with postnasal drainage and a nasal drainage. Immune system has been working overtime for the past couple weeks and that is likely the reason you are continuing to feel tired. The sure to drink plenty of fluids and stay well-hydrated He may want to change from Zyrtec to Allegra Use plenty of saline nasal spray continue with Tylenol or ibuprofen as needed.    Janne Napoleon, NP 08/21/15 1350

## 2015-08-21 NOTE — ED Notes (Signed)
C/o persistent cold sx onset yest... Report she was seen her eon 3/18 for similar sx Sx today include: ST, BA, prod cough w/bloody streaks, feeling weak, sweat & chills Denies fevers

## 2015-08-26 ENCOUNTER — Encounter: Payer: Self-pay | Admitting: Family Medicine

## 2015-08-26 ENCOUNTER — Ambulatory Visit: Payer: BC Managed Care – PPO | Attending: Family Medicine | Admitting: Family Medicine

## 2015-08-26 VITALS — BP 120/75 | HR 78 | Temp 98.0°F | Resp 16 | Wt 219.0 lb

## 2015-08-26 DIAGNOSIS — K5900 Constipation, unspecified: Secondary | ICD-10-CM | POA: Diagnosis not present

## 2015-08-26 DIAGNOSIS — R5383 Other fatigue: Secondary | ICD-10-CM | POA: Diagnosis not present

## 2015-08-26 DIAGNOSIS — I951 Orthostatic hypotension: Secondary | ICD-10-CM | POA: Insufficient documentation

## 2015-08-26 DIAGNOSIS — Z79899 Other long term (current) drug therapy: Secondary | ICD-10-CM | POA: Diagnosis not present

## 2015-08-26 DIAGNOSIS — K5909 Other constipation: Secondary | ICD-10-CM | POA: Diagnosis not present

## 2015-08-26 DIAGNOSIS — G8929 Other chronic pain: Secondary | ICD-10-CM | POA: Insufficient documentation

## 2015-08-26 DIAGNOSIS — M545 Low back pain, unspecified: Secondary | ICD-10-CM

## 2015-08-26 DIAGNOSIS — E0590 Thyrotoxicosis, unspecified without thyrotoxic crisis or storm: Secondary | ICD-10-CM | POA: Diagnosis not present

## 2015-08-26 DIAGNOSIS — B349 Viral infection, unspecified: Secondary | ICD-10-CM

## 2015-08-26 LAB — TSH: TSH: 1.08 mIU/L

## 2015-08-26 LAB — VITAMIN B12: Vitamin B-12: 1347 pg/mL — ABNORMAL HIGH (ref 200–1100)

## 2015-08-26 LAB — T4, FREE: Free T4: 1 ng/dL (ref 0.8–1.8)

## 2015-08-26 MED ORDER — LACTULOSE 10 GM/15ML PO SOLN: 10.0000 g | Freq: Three times a day (TID) | ORAL | Status: DC

## 2015-08-26 MED ORDER — MIDODRINE HCL 5 MG PO TABS: 5.0000 mg | ORAL_TABLET | Freq: Three times a day (TID) | ORAL | Status: DC

## 2015-08-26 NOTE — Patient Instructions (Signed)

## 2015-08-26 NOTE — Progress Notes (Signed)
Subjective:  Patient ID: Monica Chase, female    DOB: 02/06/1976  Age: 40 y.o. MRN: [id number]  CC: Follow-up   HPI Monica Chase is a 40 year old female with a history of hyperthyroidism, orthostatic hypotension who presents to the clinic for follow-up from urgent care where she was managed for flulike symptoms. She complains that she has had this going on 2 weeks now but was not given any medication when she presented at urgent care and goes on to say her daughter was treated for the flu with Tamiflu at some place else.  She continues to feel weak and is fatigued and has postnasal drip with no fever no nausea or vomiting. This has caused her to miss work and she is requesting a note to be absent from work from Monday through Friday of this week.  She remains on propylthiouracil for hyperthyroidism and is followed by Dr Fletcher Anon but is not satisfied with her care and is requesting a referral to a different endocrinologist as she was given the option of radioactive iodine therapy which she is not in agreement with.  Also complains of low back pain which has been intermittent and not radiating to her legs. Denies numbness or tingling in lower extremities, no leg weakness, no loss of sphincteric function. She uses OTC pain patches and also Flexeril which helps sometimes but she does not want to be dependent on any medication. She is requesting an MRI to evaluate etiology. Denies history of trauma.    Outpatient Prescriptions Prior to Visit  Medication Sig Dispense Refill  . albuterol (PROVENTIL HFA;VENTOLIN HFA) 108 (90 Base) MCG/ACT inhaler Inhale 2 puffs into the lungs every 4 (four) hours as needed for wheezing or shortness of breath. 1 Inhaler 0  . propylthiouracil (PTU) 50 MG tablet Take 1 tablet (50 mg total) by mouth daily. 30 tablet 2  . triamcinolone ointment (KENALOG) 0.5 % Apply 1 application topically 2 (two) times daily. 30 g 0  . midodrine (PROAMATINE) 5 MG tablet Take  1 tablet (5 mg total) by mouth 3 (three) times daily with meals. 90 tablet 1   No facility-administered medications prior to visit.    ROS Review of Systems  Constitutional: Positive for fatigue. Negative for activity change and appetite change.  HENT: Positive for postnasal drip. Negative for congestion, sinus pressure and sore throat.   Eyes: Negative for visual disturbance.  Respiratory: Negative for cough, chest tightness, shortness of breath and wheezing.   Cardiovascular: Negative for chest pain and palpitations.  Gastrointestinal: Negative for abdominal pain, constipation and abdominal distention.  Endocrine: Negative for polydipsia.  Genitourinary: Negative for dysuria and frequency.  Musculoskeletal: Positive for back pain. Negative for arthralgias.  Skin: Negative for rash.  Neurological: Negative for tremors, light-headedness and numbness.  Hematological: Does not bruise/bleed easily.  Psychiatric/Behavioral: Negative for behavioral problems and agitation.    Objective:  BP 120/75 mmHg  Pulse 78  Temp(Src) 98 F (36.7 C)  Resp 16  Wt 219 lb (99.338 kg)  SpO2 100%  LMP 06/17/2013  BP/Weight 08/26/2015 08/21/2015 [id number]  Systolic BP 123456 [id number]  Diastolic BP 75 78 64  Wt. (Lbs) 219 - -  BMI 30.56 - -      Physical Exam  Constitutional: She is oriented to person, place, and time. She appears well-developed and well-nourished.  Cardiovascular: Normal rate, normal heart sounds and intact distal pulses.   No murmur heard. Pulmonary/Chest: Effort normal and breath sounds normal. She has no wheezes.  She has no rales. She exhibits no tenderness.  Abdominal: Soft. Bowel sounds are normal. She exhibits no distension and no mass. There is no tenderness.  Musculoskeletal: Normal range of motion. She exhibits tenderness (midline lumbar spine tenderness; negative straight leg raise bilaterally).  Neurological: She is alert and oriented to person, place, and time.      Assessment & Plan:   1. Thyrotoxicosis without thyroid storm, unspecified thyrotoxicosis type Currently on propylthiouracil but has been noncompliant with endocrine appointments Will refer to endocrine as she is requesting second opinion - Ambulatory referral to Endocrinology - TSH - T4, free  2. Orthostatic hypotension Currently on midodrine. Symptoms are controlled - midodrine (PROAMATINE) 5 MG tablet; Take 1 tablet (5 mg total) by mouth 3 (three) times daily with meals.  Dispense: 90 tablet; Refill: 3  3. Other fatigue - Vitamin B12 - VITAMIN D 25 Hydroxy (Vit-D Deficiency, Fractures) - TSH  4. Other constipation Discussed dietary modification, increasing fiber, fruits, vegetables, water - lactulose (CHRONULAC) 10 GM/15ML solution; Take 15 mLs (10 g total) by mouth 3 (three) times daily.  Dispense: 946 mL; Refill: 2  5. Midline low back pain without sciatica Controlled on Flexeril which she takes sparingly and is refusing to use NSAIDS Also using OTC pain patch Apply heat - DG Lumbar Spine Complete; Future  6. Flulike symptoms/viral illness Explained to her that time if with most effective if initiated within 48 hours after onset of symptoms Symptomatic management. Written note for excuse from work.   Meds ordered this encounter  Medications  . lactulose (CHRONULAC) 10 GM/15ML solution    Sig: Take 15 mLs (10 g total) by mouth 3 (three) times daily.    Dispense:  946 mL    Refill:  2  . midodrine (PROAMATINE) 5 MG tablet    Sig: Take 1 tablet (5 mg total) by mouth 3 (three) times daily with meals.    Dispense:  90 tablet    Refill:  3    Follow-up: Return in about 1 month (around 09/26/2015) for follow up of low back pain.   Arnoldo Morale MD

## 2015-08-26 NOTE — Progress Notes (Signed)
Patient here for follow up from the urgent care Was seen there three times for flu like symptoms Requesting a work note for all this week and to return to work on 4/3 Patient would also like to have blood work to check her vitamin B12 and vit D

## 2015-08-27 ENCOUNTER — Other Ambulatory Visit: Payer: Self-pay | Admitting: Family Medicine

## 2015-08-27 ENCOUNTER — Telehealth: Payer: Self-pay | Admitting: Family Medicine

## 2015-08-27 DIAGNOSIS — E559 Vitamin D deficiency, unspecified: Secondary | ICD-10-CM

## 2015-08-27 LAB — VITAMIN D 25 HYDROXY (VIT D DEFICIENCY, FRACTURES): Vit D, 25-Hydroxy: 18 ng/mL — ABNORMAL LOW (ref 30–100)

## 2015-08-27 MED ORDER — DOXYCYCLINE HYCLATE 100 MG PO TABS: 100.0000 mg | ORAL_TABLET | Freq: Two times a day (BID) | ORAL | Status: DC

## 2015-08-27 MED ORDER — ERGOCALCIFEROL 1.25 MG (50000 UT) PO CAPS: 50000.0000 [IU] | ORAL_CAPSULE | ORAL | Status: DC

## 2015-08-27 NOTE — Telephone Encounter (Signed)
Pt. Is requesting for antibiotics to be sent to [facility] on [address]...Marland KitchenMarland KitchenMarland Kitchenplease follow up with patient who is experiencing sinus symptoms

## 2015-08-27 NOTE — Telephone Encounter (Signed)
-----   Message from Arnoldo Morale, MD sent at 08/27/2015 11:45 AM EDT ----- Please inform her that labs reveal normal thyroid panel, high vitamin B12 levels and low vitamin B12 levels. I have sent a prescription for vitamin D replacement to the pharmacy. I have also sent a prescription for an antibiotic to her pharmacy.

## 2015-08-27 NOTE — Telephone Encounter (Signed)
Doxycycline sent in.

## 2015-08-27 NOTE — Telephone Encounter (Signed)
Date of birth verified by pt  Lab results given  Notified Rx Vit D and Doxycycline send to [facility]

## 2015-09-06 ENCOUNTER — Ambulatory Visit (HOSPITAL_COMMUNITY)
Admission: RE | Admit: 2015-09-06 | Discharge: 2015-09-06 | Disposition: A | Discharge status: Home / Self Care | Payer: BC Managed Care – PPO | Source: Ambulatory Visit | Attending: Family Medicine | Admitting: Family Medicine

## 2015-09-06 DIAGNOSIS — M545 Low back pain, unspecified: Secondary | ICD-10-CM

## 2015-09-07 ENCOUNTER — Telehealth: Payer: Self-pay | Admitting: Family Medicine

## 2015-09-07 NOTE — Telephone Encounter (Signed)
Pt. Called requesting her x-ray results. Please f/u

## 2015-09-08 NOTE — Telephone Encounter (Signed)
Patient is calling requesting xray lab results...Monica KitchenMarland Chase Please follow up

## 2015-09-08 NOTE — Telephone Encounter (Signed)
Pt. Called requesting her xray results. Please f/u with pt.

## 2015-09-09 ENCOUNTER — Telehealth: Payer: Self-pay

## 2015-09-09 NOTE — Telephone Encounter (Signed)
-----   Message from Arnoldo Morale, MD sent at 09/07/2015  5:15 PM EDT ----- Xray of the lumbar spine is normal

## 2015-09-09 NOTE — Telephone Encounter (Signed)
CMA called patient, patient did not answer. A message for the patient to return my call was left.

## 2015-09-13 NOTE — Telephone Encounter (Signed)
Left message for patient to return my call.   Note to patient:   Let patient know that her xray of the lumbar spine is normal.

## 2015-09-13 NOTE — Telephone Encounter (Signed)
Patient was notified of results per nurses note.Marland KitchenMarland KitchenMarland Kitchen

## 2015-09-14 NOTE — Telephone Encounter (Signed)
Called patient, patient didn't answer. Let a message for the patient to return my call. 2nd attempt to reach patient.  Note to patient:  ----- Message from Arnoldo Morale, MD sent at 09/07/2015 5:15 PM EDT -----  Xray of the lumbar spine is normal

## 2015-09-14 NOTE — Telephone Encounter (Signed)
-----   Message from Arnoldo Morale, MD sent at 09/07/2015  5:15 PM EDT ----- Xray of the lumbar spine is normal

## 2015-09-17 NOTE — Telephone Encounter (Signed)
Called patient, patient verified name and DOB. Patient states that she spoke with someone at the front desk and they gave her the results. Patient verbalize understanding with no further questions.

## 2015-09-21 ENCOUNTER — Telehealth: Payer: Self-pay

## 2015-09-21 NOTE — Telephone Encounter (Signed)
Patient was contacted back on 08/2015 with xray results.   Monica Chase at 09/13/2015 2:01 PM     Status: Signed       Expand All Collapse All   Patient was notified of results per nurses note.Marland KitchenMarland KitchenMarland Kitchen

## 2015-09-22 ENCOUNTER — Ambulatory Visit: Payer: BC Managed Care – PPO | Admitting: Family Medicine

## 2015-10-21 ENCOUNTER — Other Ambulatory Visit: Payer: Self-pay | Admitting: Endocrinology

## 2015-10-21 NOTE — Telephone Encounter (Signed)
Please refill x 1 Ov is due  

## 2015-11-02 ENCOUNTER — Telehealth: Payer: Self-pay | Admitting: Family Medicine

## 2015-11-02 NOTE — Telephone Encounter (Signed)
Patient called stating the she needs a doctors note to be excused from work tomorrow. She stated that during work today she worked in heat and became dizzy. Please follow up.

## 2015-11-02 NOTE — Telephone Encounter (Signed)
I am unable to give her an excuse note from work due to the absence of air condition at her place of work; I will encourage her though to stay hydrated.

## 2015-11-02 NOTE — Telephone Encounter (Signed)
Pt. Requesting to talk to provider about receiving a doctors note for work tomorrow. Pt says her work environment, extreme heat and no air condition, does not work well with her current medication Told pt that we give up to 48 hours for response to messages.  Please assist

## 2015-11-03 ENCOUNTER — Encounter: Payer: Self-pay | Admitting: Family Medicine

## 2015-11-03 ENCOUNTER — Ambulatory Visit: Payer: BC Managed Care – PPO | Attending: Family Medicine | Admitting: Family Medicine

## 2015-11-03 VITALS — BP 117/83 | HR 64 | Temp 98.1°F | Resp 16 | Ht 70.0 in | Wt 219.0 lb

## 2015-11-03 DIAGNOSIS — R42 Dizziness and giddiness: Secondary | ICD-10-CM | POA: Diagnosis not present

## 2015-11-03 DIAGNOSIS — I951 Orthostatic hypotension: Secondary | ICD-10-CM | POA: Diagnosis not present

## 2015-11-03 NOTE — Progress Notes (Signed)
Pt here for dizziness. Pt reports yesterday she became really sick and dizzy at work due to the A/C not working. Pt reports feeling lots of pressure that didn't subside until this morning. Pt reports the medication midodrine helps sometimes with dizziness, but yesterday it made her sicker.  Pt denies any pain. Pt has not taken any medications today.

## 2015-11-03 NOTE — Patient Instructions (Signed)

## 2015-11-03 NOTE — Progress Notes (Signed)
Subjective:  Patient ID: Monica Chase, female    DOB: 1975/06/16  Age: 40 y.o. 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 She stated yesterday the air conditioner was not working and she was dizzy and felt sick to the point of feeling faint. She has suffered with similar symptoms in the past with near syncope and syncope with unremarkable brain imaging and remains on midodrine.  Outpatient Prescriptions Prior to Visit  Medication Sig Dispense Refill  . albuterol (PROVENTIL HFA;VENTOLIN HFA) 108 (90 Base) MCG/ACT inhaler Inhale 2 puffs into the lungs every 4 (four) hours as needed for wheezing or shortness of breath. 1 Inhaler 0  . midodrine (PROAMATINE) 5 MG tablet Take 1 tablet (5 mg total) by mouth 3 (three) times daily with meals. 90 tablet 3  . propylthiouracil (PTU) 50 MG tablet Take 1 tablet (50 mg total) by mouth daily. **PT NEEDS APPT FOR FURTHER REFILLS** 30 tablet 1  . doxycycline (VIBRA-TABS) 100 MG tablet Take 1 tablet (100 mg total) by mouth 2 (two) times daily. (Patient not taking: Reported on 11/03/2015) 20 tablet 0  . ergocalciferol (VITAMIN D2) 50000 units capsule Take 1 capsule (50,000 Units total) by mouth once a week. (Patient not taking: Reported on 11/03/2015) 9 capsule 0  . lactulose (CHRONULAC) 10 GM/15ML solution Take 15 mLs (10 g total) by mouth 3 (three) times daily. (Patient not taking: Reported on 11/03/2015) 946 mL 2  . triamcinolone ointment (KENALOG) 0.5 % Apply 1 application topically 2 (two) times daily. (Patient not taking: Reported on 11/03/2015) 30 g 0   No facility-administered medications prior to visit.    ROS Review of Systems  Constitutional: Negative for  activity change and appetite change.  HENT: Negative for sinus pressure and sore throat.   Respiratory: Negative for chest tightness, shortness of breath and wheezing.   Cardiovascular: Negative for chest pain and palpitations.  Gastrointestinal: Negative for abdominal pain, constipation and abdominal distention.  Genitourinary: Negative.   Musculoskeletal: Negative.   Neurological: Positive for light-headedness.  Psychiatric/Behavioral: Negative for behavioral problems and dysphoric mood.    Objective:  BP 117/83 mmHg  Pulse 64  Temp(Src) 98.1 F (36.7 C) (Oral)  Resp 16  Ht 5\' 10"  (1.778 m)  Wt 219 lb (99.338 kg)  BMI 31.42 kg/m2  SpO2 98%  LMP 06/17/2013  BP/Weight 11/03/2015 08/26/2015 [id number]  Systolic BP [id number] 123456 [id number]  Diastolic BP 83 75 78  Wt. (Lbs) 219 219 -  BMI 31.42 30.56 -      Physical Exam  Constitutional: She is oriented to person, place, and time. She appears well-developed and well-nourished.  Cardiovascular: Normal rate, normal heart sounds and intact distal pulses.   No murmur heard. Pulmonary/Chest: Effort normal and breath sounds normal. She has no wheezes. She has no rales. She exhibits no tenderness.  Abdominal: Soft. Bowel sounds are normal. She exhibits no distension and no mass. There is no tenderness.  Musculoskeletal: Normal range of motion.  Neurological: She is alert and oriented to person, place, and time.     Assessment & Plan:   1. 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  2. Orthostatic hypotension Continue midodrine   No orders of the defined types were placed in this encounter.    Follow-up: 2 months for follow-up of orthostatic hypotension.  Arnoldo Morale MD

## 2015-11-05 ENCOUNTER — Ambulatory Visit: Payer: BC Managed Care – PPO | Admitting: Family Medicine

## 2015-11-12 ENCOUNTER — Ambulatory Visit (HOSPITAL_COMMUNITY)
Admission: EM | Admit: 2015-11-12 | Discharge: 2015-11-12 | Disposition: A | Discharge status: Home / Self Care | Payer: BC Managed Care – PPO | Attending: Family Medicine | Admitting: Family Medicine

## 2015-11-12 ENCOUNTER — Encounter (HOSPITAL_COMMUNITY): Payer: Self-pay | Admitting: Emergency Medicine

## 2015-11-12 DIAGNOSIS — J0101 Acute recurrent maxillary sinusitis: Secondary | ICD-10-CM

## 2015-11-12 DIAGNOSIS — H109 Unspecified conjunctivitis: Secondary | ICD-10-CM

## 2015-11-12 MED ORDER — LEVOFLOXACIN 500 MG PO TABS: 500.0000 mg | ORAL_TABLET | Freq: Every day | ORAL | Status: DC

## 2015-11-12 MED ORDER — METHYLPREDNISOLONE 4 MG PO TBPK: ORAL_TABLET | ORAL | Status: DC

## 2015-11-12 MED ORDER — POLYMYXIN B-TRIMETHOPRIM 10000-0.1 UNIT/ML-% OP SOLN: 2.0000 [drp] | OPHTHALMIC | Status: DC

## 2015-11-12 NOTE — ED Provider Notes (Signed)
CSN: [id number]     Arrival date & time 11/12/15  1556 History   None    Chief Complaint  Patient presents with  . URI   (Consider location/radiation/quality/duration/timing/severity/associated sxs/prior Treatment) HPI Comments: Patient c/o sinus pain in maxillary sinuses.  She states she has been having sx's for over 3 weeks. She states she did not take all of her abx's that were rx'd and she is having worsening sx's.   She c/o right eye drainage and some pink eye sx's.  She has been taking flonase NS.  Patient is a 40 y.o. female presenting with URI. The history is provided by the patient.  URI Presenting symptoms: congestion and facial pain   Severity:  Moderate Onset quality:  Gradual Duration:  3 weeks Timing:  Constant Progression:  Worsening Chronicity:  Recurrent Relieved by:  Nothing Worsened by:  Nothing tried Ineffective treatments:  Prescription medications and OTC medications Associated symptoms: headaches and sinus pain     Past Medical History  Diagnosis Date  . HYPERTHYROIDISM 07/20/2009  . DEPRESSION, MAJOR, RECURRENT 07/26/2006  . ANXIETY 07/26/2006  . ASTHMA, INTERMITTENT 07/26/2006  . HYPERGLYCEMIA 09/28/2009  . CONSTIPATION, CHRONIC 06/22/2010  . Dizziness 11/09/2010  . OBESITY 01/31/2008  . Fibroid    Past Surgical History  Procedure Laterality Date  . Tubal ligation  2008  . Iud removal  10/07    Mirena removed 05/10/2004  . Pilonidal cyst excision    . Vaginal delivery      X 4  . Vaginal hysterectomy N/A 07/07/2013    Procedure: HYSTERECTOMY VAGINAL;  Surgeon: Donnamae Jude, MD;  Location: [facility];  Service: Gynecology;  Laterality: N/A;  . Bilateral salpingectomy Bilateral 07/07/2013    Procedure: BILATERAL SALPINGECTOMY;  Surgeon: Donnamae Jude, MD;  Location: [facility];  Service: Gynecology;  Laterality: Bilateral;   Family History  Problem Relation Age of Onset  . Hypertension Other   . Breast cancer Mother   . Hypertension Mother   . Diabetes Mother    . Hypertension Father   . Diabetes Father   . Breast cancer Maternal Aunt   . Ovarian cancer Maternal Aunt   . Stomach cancer Paternal Grandmother   . Hypertension Sister    Social History  Substance Use Topics  . Smoking status: Former Smoker    Quit date: 11/08/2005  . Smokeless tobacco: Never Used  . Alcohol Use: No   OB History    Gravida Para Term Preterm AB TAB SAB Ectopic Multiple Living   4 4 4       4      Review of Systems  Constitutional: Negative.   HENT: Positive for congestion and sinus pressure.   Eyes: Positive for discharge and redness.  Respiratory: Negative.   Cardiovascular: Negative.   Endocrine: Negative.   Skin: Negative.   Allergic/Immunologic: Negative.   Neurological: Positive for headaches.  Hematological: Negative.   Psychiatric/Behavioral: Negative.     Allergies  Penicillins and Bacitracin-lidocaine  Home Medications   Prior to Admission medications   Medication Sig Start Date End Date Taking? Authorizing Provider  albuterol (PROVENTIL HFA;VENTOLIN HFA) 108 (90 Base) MCG/ACT inhaler Inhale 2 puffs into the lungs every 4 (four) hours as needed for wheezing or shortness of breath. 08/14/15   Janne Napoleon, NP  ergocalciferol (VITAMIN D2) 50000 units capsule Take 1 capsule (50,000 Units total) by mouth once a week. Patient not taking: Reported on 11/03/2015 08/27/15   Arnoldo Morale, MD  ferrous sulfate 325 (65  FE) MG tablet Take 325 mg by mouth daily with breakfast.    Historical Provider, MD  midodrine (PROAMATINE) 5 MG tablet Take 1 tablet (5 mg total) by mouth 3 (three) times daily with meals. 08/26/15   Arnoldo Morale, MD  Multiple Vitamin (MULTIVITAMIN WITH MINERALS) TABS tablet Take 1 tablet by mouth daily.    Historical Provider, MD  propylthiouracil (PTU) 50 MG tablet Take 1 tablet (50 mg total) by mouth daily. **PT NEEDS APPT FOR FURTHER REFILLS** 10/22/15   Renato Shin, MD   Meds Ordered and Administered this Visit  Medications - No data to  display  BP 115/72 mmHg  Pulse 63  Temp(Src) 98.2 F (36.8 C) (Oral)  Resp 18  SpO2 98%  LMP 06/17/2013 No data found.   Physical Exam  Constitutional: She appears well-developed and well-nourished.  HENT:  Head: Normocephalic.  Right Ear: External ear normal.  Left Ear: External ear normal.  Mouth/Throat: Oropharynx is clear and moist.  TTP maxillary sinuses  Eyes: Pupils are equal, round, and reactive to light. Right eye exhibits discharge.  Right conjunctiva injected with yellow discharge.  Cardiovascular: Normal rate, regular rhythm and normal heart sounds.   Pulmonary/Chest: Effort normal and breath sounds normal.  Abdominal: Soft. Bowel sounds are normal.    ED Course  Procedures (including critical care time)  Labs Review Labs Reviewed - No data to display  Imaging Review No results found.   Visual Acuity Review  Right Eye Distance:   Left Eye Distance:   Bilateral Distance:    Right Eye Near:   Left Eye Near:    Bilateral Near:         MDM  Sinusitis Conjunctivitis OD  Levaquin 500mg  one po qd x 10 days Medrol dose pack as directed 4mg  #21 Polytrim eye gtt's 2 gtt's OD q 2-6 hours x 7 days #10 ml Push po fluids, rest, tylenol and motrin otc prn as directed for fever, arthralgias, and myalgias.  Follow up prn if sx's continue or persist.    Lysbeth Penner, FNP 11/12/15 1718

## 2015-11-12 NOTE — Discharge Instructions (Signed)

## 2015-11-12 NOTE — ED Notes (Signed)
Here for cold sx onset x1 week Sx include facial pressure, runny nose dizziness, HA, bilateral ear pain, watery eyes.... Taking flonase and doxycycline w/no relief..;.. A&O x4... No acute distress.

## 2015-11-24 ENCOUNTER — Telehealth: Payer: Self-pay | Admitting: Family Medicine

## 2015-11-24 DIAGNOSIS — M545 Low back pain, unspecified: Secondary | ICD-10-CM

## 2015-11-24 NOTE — Telephone Encounter (Signed)
Patient called requesting an appt to get MRI for her back pain, patient is also requesting medication refill on flexeril, please f/up

## 2015-11-25 MED ORDER — CYCLOBENZAPRINE HCL 10 MG PO TABS: 10.0000 mg | ORAL_TABLET | Freq: Every day | ORAL | Status: DC

## 2015-11-25 NOTE — Addendum Note (Signed)
Addended by: Arnoldo Morale on: 11/25/2015 02:08 PM   Modules accepted: Orders

## 2015-11-25 NOTE — Telephone Encounter (Signed)
Patient would like to be referred for an MRI. Patient is also requesting a refill for a muscle relaxer. Please follow up.

## 2015-11-26 NOTE — Telephone Encounter (Signed)
Cyclobenzaprine was refilled yesterday by Dr. Jarold Song, will defer the rest of the requests to her.

## 2015-11-28 ENCOUNTER — Ambulatory Visit (HOSPITAL_COMMUNITY)
Admission: EM | Admit: 2015-11-28 | Discharge: 2015-11-28 | Disposition: A | Discharge status: Home / Self Care | Payer: BC Managed Care – PPO | Attending: Emergency Medicine | Admitting: Emergency Medicine

## 2015-11-28 ENCOUNTER — Encounter (HOSPITAL_COMMUNITY): Payer: Self-pay | Admitting: Emergency Medicine

## 2015-11-28 DIAGNOSIS — K2970 Gastritis, unspecified, without bleeding: Secondary | ICD-10-CM | POA: Diagnosis not present

## 2015-11-28 MED ORDER — ONDANSETRON HCL 4 MG PO TABS
4.0000 mg | ORAL_TABLET | Freq: Four times a day (QID) | ORAL | Status: DC
Start: 2015-11-28 — End: 2016-02-20

## 2015-11-28 NOTE — ED Provider Notes (Signed)
CSN: [id number]     Arrival date & time 11/28/15  1158 History   First MD Initiated Contact with Patient 11/28/15 1213     Chief Complaint  Patient presents with  . Nausea  . Weakness   (Consider location/radiation/quality/duration/timing/severity/associated sxs/prior Treatment) HPI Comments: 40 year old female, planing of nausea, weakness, sweats, chills for 2 days. She is also complaining of pain to the right lateral chest associated with breathing only. Denies fevers, shortness of breath or vomiting. Her daughter had similar symptoms this week but has since recovered.   Past Medical History  Diagnosis Date  . HYPERTHYROIDISM 07/20/2009  . DEPRESSION, MAJOR, RECURRENT 07/26/2006  . ANXIETY 07/26/2006  . ASTHMA, INTERMITTENT 07/26/2006  . HYPERGLYCEMIA 09/28/2009  . CONSTIPATION, CHRONIC 06/22/2010  . Dizziness 11/09/2010  . OBESITY 01/31/2008  . Fibroid    Past Surgical History  Procedure Laterality Date  . Tubal ligation  2008  . Iud removal  10/07    Mirena removed 05/10/2004  . Pilonidal cyst excision    . Vaginal delivery      X 4  . Vaginal hysterectomy N/A 07/07/2013    Procedure: HYSTERECTOMY VAGINAL;  Surgeon: Donnamae Jude, MD;  Location: [facility];  Service: Gynecology;  Laterality: N/A;  . Bilateral salpingectomy Bilateral 07/07/2013    Procedure: BILATERAL SALPINGECTOMY;  Surgeon: Donnamae Jude, MD;  Location: [facility];  Service: Gynecology;  Laterality: Bilateral;   Family History  Problem Relation Age of Onset  . Hypertension Other   . Breast cancer Mother   . Hypertension Mother   . Diabetes Mother   . Hypertension Father   . Diabetes Father   . Breast cancer Maternal Aunt   . Ovarian cancer Maternal Aunt   . Stomach cancer Paternal Grandmother   . Hypertension Sister    Social History  Substance Use Topics  . Smoking status: Former Smoker    Quit date: 11/08/2005  . Smokeless tobacco: Never Used  . Alcohol Use: No   OB History    Gravida Para Term Preterm AB  TAB SAB Ectopic Multiple Living   4 4 4       4      Review of Systems  Constitutional: Positive for chills, activity change and fatigue. Negative for fever.  HENT: Negative.   Eyes: Negative for visual disturbance.  Respiratory: Negative for cough, chest tightness and shortness of breath.   Cardiovascular:       Right lateral chest wall pain associated with breathing.  Gastrointestinal: Positive for nausea. Negative for vomiting, abdominal pain and constipation.  Genitourinary: Negative.   Musculoskeletal: Negative.   Skin: Negative for rash.  Neurological: Negative.   Psychiatric/Behavioral: Negative.   All other systems reviewed and are negative.   Allergies  Penicillins and Bacitracin-lidocaine  Home Medications   Prior to Admission medications   Medication Sig Start Date End Date Taking? Authorizing Provider  midodrine (PROAMATINE) 5 MG tablet Take 1 tablet (5 mg total) by mouth 3 (three) times daily with meals. 08/26/15  Yes Arnoldo Morale, MD  propylthiouracil (PTU) 50 MG tablet Take 1 tablet (50 mg total) by mouth daily. **PT NEEDS APPT FOR FURTHER REFILLS** 10/22/15  Yes Renato Shin, MD  albuterol (PROVENTIL HFA;VENTOLIN HFA) 108 (90 Base) MCG/ACT inhaler Inhale 2 puffs into the lungs every 4 (four) hours as needed for wheezing or shortness of breath. 08/14/15   Janne Napoleon, NP  cyclobenzaprine (FLEXERIL) 10 MG tablet Take 1 tablet (10 mg total) by mouth at bedtime. 11/25/15  Arnoldo Morale, MD  ergocalciferol (VITAMIN D2) 50000 units capsule Take 1 capsule (50,000 Units total) by mouth once a week. Patient not taking: Reported on 11/03/2015 08/27/15   Arnoldo Morale, MD  ferrous sulfate 325 (65 FE) MG tablet Take 325 mg by mouth daily with breakfast.    Historical Provider, MD  methylPREDNISolone (MEDROL DOSEPAK) 4 MG TBPK tablet Take 6-5-4-3-2-1 po qd 11/12/15   Lysbeth Penner, FNP  Multiple Vitamin (MULTIVITAMIN WITH MINERALS) TABS tablet Take 1 tablet by mouth daily.    Historical  Provider, MD  ondansetron (ZOFRAN) 4 MG tablet Take 1 tablet (4 mg total) by mouth every 6 (six) hours. 11/28/15   Janne Napoleon, NP  trimethoprim-polymyxin b (POLYTRIM) ophthalmic solution Place 2 drops into the right eye every 4 (four) hours. 11/12/15   Lysbeth Penner, FNP   Meds Ordered and Administered this Visit  Medications - No data to display  BP 118/73 mmHg  Pulse 84  Temp(Src) 98.6 F (37 C) (Oral)  Resp 18  SpO2 100%  LMP 06/17/2013 No data found.   Physical Exam  Constitutional: She is oriented to person, place, and time. She appears well-developed and well-nourished. No distress.  HENT:  Mouth/Throat: Oropharynx is clear and moist. No oropharyngeal exudate.  Bilateral TMs are normal.  Eyes: Conjunctivae and EOM are normal.  Neck: Normal range of motion. Neck supple.  Cardiovascular: Normal rate, normal heart sounds and intact distal pulses.   Pulmonary/Chest: Effort normal and breath sounds normal. No respiratory distress. She has no wheezes. She has no rales.  Lungs are perfectly clear. No pleural friction rub. Minor tenderness to the right lateral chest wall.  Abdominal: Soft. Bowel sounds are normal. She exhibits no distension and no mass. There is no rebound and no guarding.  Mild tenderness to the epigastric region. No other areas of tenderness.  Musculoskeletal: She exhibits no edema.  Lymphadenopathy:    She has no cervical adenopathy.  Neurological: She is alert and oriented to person, place, and time. She exhibits normal muscle tone.  Skin: Skin is warm and dry. She is not diaphoretic.  Psychiatric: She has a normal mood and affect.  Nursing note and vitals reviewed.   ED Course  Procedures (including critical care time)  Labs Review Labs Reviewed - No data to display  Imaging Review No results found.   Visual Acuity Review  Right Eye Distance:   Left Eye Distance:   Bilateral Distance:    Right Eye Near:   Left Eye Near:    Bilateral Near:          MDM   1. Viral gastritis    Lots of clear liquids. Zofran for nausea.  Meds ordered this encounter  Medications  . ondansetron (ZOFRAN) 4 MG tablet    Sig: Take 1 tablet (4 mg total) by mouth every 6 (six) hours.    Dispense:  12 tablet    Refill:  0    Order Specific Question:  Supervising Provider    Answer:  Melony Overly [id number]   Reassurance. Clear liquids as above. No heavy meals spicy greasy or fast foods. Read instructions on diet for brought gastritis. Should be improving in the next couple of days. If not follow up with your primary care doctor.    Janne Napoleon, NP 11/28/15 1247

## 2015-11-28 NOTE — Discharge Instructions (Signed)
Gastritis, Adult  Lots of clear liquids. Zofran for nausea.   Gastritis is soreness and puffiness (inflammation) of the lining of the stomach. If you do not get help, gastritis can cause bleeding and sores (ulcers) in the stomach. HOME CARE   Only take medicine as told by your doctor.  If you were given antibiotic medicines, take them as told. Finish the medicines even if you start to feel better.  Drink enough fluids to keep your pee (urine) clear or pale yellow.  Avoid foods and drinks that make your problems worse. Foods you may want to avoid include:  Caffeine or alcohol.  Chocolate.  Mint.  Garlic and onions.  Spicy foods.  Citrus fruits, including oranges, lemons, or limes.  Food containing tomatoes, including sauce, chili, salsa, and pizza.  Fried and fatty foods.  Eat small meals throughout the day instead of large meals. GET HELP RIGHT AWAY IF:   You have black or dark red poop (stools).  You throw up (vomit) blood. It may look like coffee grounds.  You cannot keep fluids down.  Your belly (abdominal) pain gets worse.  You have a fever.  You do not feel better after 1 week.  You have any other questions or concerns. MAKE SURE YOU:   Understand these instructions.  Will watch your condition.  Will get help right away if you are not doing well or get worse.   This information is not intended to replace advice given to you by your health care provider. Make sure you discuss any questions you have with your health care provider.   Document Released: 11/01/2007 Document Revised: 08/07/2011 Document Reviewed: 06/28/2011 Elsevier Interactive Patient Education [employer].

## 2015-11-28 NOTE — ED Notes (Signed)
The patient presented to the [facility] with a complaint of weakness, nausea, and chills x 3 days.

## 2015-12-01 ENCOUNTER — Other Ambulatory Visit: Payer: Self-pay | Admitting: Family Medicine

## 2015-12-01 NOTE — Progress Notes (Signed)
Cardiac you please inform her that [facility] denied her MRI of the lumbar spine? Thanks.

## 2015-12-01 NOTE — Addendum Note (Signed)
Addended by: Arnoldo Morale on: 12/01/2015 08:14 AM   Modules accepted: Orders

## 2015-12-01 NOTE — Telephone Encounter (Signed)
Scan scheduled for 12/08/15 at 5:00.  Patient aware.

## 2015-12-01 NOTE — Telephone Encounter (Signed)
Could you please schedule the MRI? Thanks

## 2015-12-07 ENCOUNTER — Telehealth: Payer: Self-pay | Admitting: Family Medicine

## 2015-12-07 NOTE — Telephone Encounter (Signed)
Patient called, pt is scheduled tomorrow 12/08/15 to get an MRI done, patient states insurance did not authorize MRI and she needs to pay out of pocket.pt states the reason insurance isn't authorizing it is because we did not specify correctly. Please f/up

## 2015-12-07 NOTE — Telephone Encounter (Signed)
Patient advised per Dr Amao:Cardiac you please inform her that [facility] denied her MRI of the lumbar spine?   Patient states representative at [facility] cone stated that MRI can be approved if the doctor can re word documentation.  Representative states Doctor only mentions low back pain.  Additional documentation is needed to support request.  New information obtained from patient as follows: Pain running up the neck, Can't sit for long periods of time due to pinching pain in lower back, back spasms. Patient has tried/failed:Flexeril, Acetaminophen, ibuprofen, Salonas Patch, soak in epsom salt.  Rn advised patient will send message to provider.  Patient verbalized understanding.

## 2015-12-08 ENCOUNTER — Ambulatory Visit (HOSPITAL_COMMUNITY): Admission: RE | Admit: 2015-12-08 | Payer: BC Managed Care – PPO | Source: Ambulatory Visit

## 2015-12-08 ENCOUNTER — Telehealth: Payer: Self-pay | Admitting: Family Medicine

## 2015-12-08 ENCOUNTER — Ambulatory Visit (HOSPITAL_COMMUNITY): Payer: BC Managed Care – PPO

## 2015-12-08 NOTE — Telephone Encounter (Signed)
We will need to discuss her back pain at an office visit.

## 2015-12-08 NOTE — Telephone Encounter (Signed)
Patient is here and wanting doctor to change order so her insurance company will pay for her MRI. Patient is aware that doctor said she needs an OV to discuss back pain.  Patient is aware that the first order for MRI was denied  Patient would like the doctor to contact [employer] and give them the missing information.  [employer] [phone]

## 2015-12-09 ENCOUNTER — Ambulatory Visit: Payer: BC Managed Care – PPO | Attending: Family Medicine | Admitting: Family Medicine

## 2015-12-09 ENCOUNTER — Encounter: Payer: Self-pay | Admitting: Family Medicine

## 2015-12-09 VITALS — BP 101/71 | HR 72 | Temp 97.9°F | Ht 70.0 in | Wt 221.2 lb

## 2015-12-09 DIAGNOSIS — M545 Low back pain, unspecified: Secondary | ICD-10-CM

## 2015-12-09 DIAGNOSIS — H5789 Other specified disorders of eye and adnexa: Secondary | ICD-10-CM

## 2015-12-09 DIAGNOSIS — H578 Other specified disorders of eye and adnexa: Secondary | ICD-10-CM

## 2015-12-09 MED ORDER — PREDNISONE 20 MG PO TABS: 20.0000 mg | ORAL_TABLET | Freq: Every day | ORAL | Status: DC

## 2015-12-09 MED ORDER — METHOCARBAMOL 750 MG PO TABS: 750.0000 mg | ORAL_TABLET | Freq: Three times a day (TID) | ORAL | Status: DC | PRN

## 2015-12-09 MED ORDER — GABAPENTIN 300 MG PO CAPS: 300.0000 mg | ORAL_CAPSULE | Freq: Two times a day (BID) | ORAL | Status: DC

## 2015-12-09 MED ORDER — ERYTHROMYCIN 5 MG/GM OP OINT: 1.0000 "application " | TOPICAL_OINTMENT | Freq: Every day | OPHTHALMIC | Status: DC

## 2015-12-09 NOTE — Progress Notes (Signed)
Subjective:  Patient ID: Monica Chase, female    DOB: January 14, 1976  Age: 40 y.o. MRN: [id number]  CC: Back Pain; Gait Problem; and Eye Problem   HPI Monica Chase is a 40 year old female with a history of orthostatic hypotension, asthma, hyperthyroidism who comes into the clinic (after calling the clinic requesting an MRI of her back be ordered for back pain ) complaining of back pain for the last 3 years which has worsened in the last month. Pain is described as a 5/10 but increases in intensity with activity. Pain radiates up to her neck as a shooting sensation, she has associated muscle spasms and has had to use a walker to ambulate. Denies numbness in lower extremities or falls and has no loss of sphincteric function She feels comfortable his legs spread out. Lumbar X-ray performed in 08/2015 was unremarkable. Using OTC pain patches which do not help and previously had Flexeril which she stopped using because of the sedating nature  MRI order was denied by [facility] and I explained to her that there was no justification for the MRI of her lumbar spine as the only information she had provided was back pain and she had not tried other modalities of treatment. I also educated her about imaging guidelines and management of low back pain.  She would also like to receive  additional eye antibiotics as she has taking of her eyelids and waking up and was treated for 9 infection in urgent care but lost the antibiotic drops.  Outpatient Prescriptions Prior to Visit  Medication Sig Dispense Refill  . albuterol (PROVENTIL HFA;VENTOLIN HFA) 108 (90 Base) MCG/ACT inhaler Inhale 2 puffs into the lungs every 4 (four) hours as needed for wheezing or shortness of breath. 1 Inhaler 0  . ferrous sulfate 325 (65 FE) MG tablet Take 325 mg by mouth daily with breakfast.    . midodrine (PROAMATINE) 5 MG tablet Take 1 tablet (5 mg total) by mouth 3 (three) times daily with meals. 90 tablet  3  . Multiple Vitamin (MULTIVITAMIN WITH MINERALS) TABS tablet Take 1 tablet by mouth daily.    Marland Kitchen propylthiouracil (PTU) 50 MG tablet Take 1 tablet (50 mg total) by mouth daily. **PT NEEDS APPT FOR FURTHER REFILLS** 30 tablet 1  . trimethoprim-polymyxin b (POLYTRIM) ophthalmic solution Place 2 drops into the right eye every 4 (four) hours. 10 mL 0  . ondansetron (ZOFRAN) 4 MG tablet Take 1 tablet (4 mg total) by mouth every 6 (six) hours. (Patient not taking: Reported on 12/09/2015) 12 tablet 0  . cyclobenzaprine (FLEXERIL) 10 MG tablet Take 1 tablet (10 mg total) by mouth at bedtime. (Patient not taking: Reported on 12/09/2015) 30 tablet 0  . ergocalciferol (VITAMIN D2) 50000 units capsule Take 1 capsule (50,000 Units total) by mouth once a week. (Patient not taking: Reported on 11/03/2015) 9 capsule 0  . methylPREDNISolone (MEDROL DOSEPAK) 4 MG TBPK tablet Take 6-5-4-3-2-1 po qd 21 tablet 0   No facility-administered medications prior to visit.    ROS Review of Systems  Constitutional: Negative for activity change and appetite change.  HENT: Negative for sinus pressure and sore throat.   Respiratory: Negative for chest tightness, shortness of breath and wheezing.   Cardiovascular: Negative for chest pain and palpitations.  Gastrointestinal: Negative for abdominal pain, constipation and abdominal distention.  Genitourinary: Negative.   Musculoskeletal:       See hpi  Psychiatric/Behavioral: Negative for behavioral problems and dysphoric mood.  Objective:  BP 101/71 mmHg  Pulse 72  Temp(Src) 97.9 F (36.6 C) (Oral)  Ht 5\' 10"  (1.778 m)  Wt 221 lb 3.2 oz (100.336 kg)  BMI 31.74 kg/m2  SpO2 97%  LMP 06/17/2013  BP/Weight 12/09/2015 11/28/2015 [id number]  Systolic BP [id number]  Diastolic BP 71 73 72  Wt. (Lbs) 221.2 - -  BMI 31.74 - -      Physical Exam  Constitutional: She is oriented to person, place, and time. She appears well-developed and well-nourished.  Cardiovascular:  Normal rate, normal heart sounds and intact distal pulses.   No murmur heard. Pulmonary/Chest: Effort normal and breath sounds normal. She has no wheezes. She has no rales. She exhibits no tenderness.  Abdominal: Soft. Bowel sounds are normal. She exhibits no distension and no mass. There is no tenderness.  Musculoskeletal: She exhibits tenderness (tenderness to palpation of the thoracolumbar spine, bilateral paraspinal muscles. Positive straight leg raise bilaterally.).  Neurological: She is alert and oriented to person, place, and time.  Ambulates with the aid of a walker No sensory loss in bilateral lower extremities    CLINICAL DATA: Low back pain without sciatica  EXAM: LUMBAR SPINE - COMPLETE 4+ VIEW  COMPARISON: 11/26/2009  FINDINGS: There is no evidence of lumbar spine fracture. Alignment is normal. Intervertebral disc spaces are maintained.  IMPRESSION: Negative lumbar spine.   Electronically Signed  By: Monte Fantasia M.D.  On: 09/06/2015 09:21   Assessment & Plan:   1. Midline low back pain without sciatica No red flags at this time. Previous x-ray in 08/2015 was normal I have informed the patient about the stepwise management of back pain and discussed alarming symptoms; MRI not justifiable at this time as she has not tried conservative measures. Will place on muscle relaxant, prednisone and gabapentin for now. We'll reassess at next visit and determine need for MRI - predniSONE (DELTASONE) 20 MG tablet; Take 1 tablet (20 mg total) by mouth daily with breakfast.  Dispense: 5 tablet; Refill: 0 - methocarbamol (ROBAXIN-750) 750 MG tablet; Take 1 tablet (750 mg total) by mouth every 8 (eight) hours as needed for muscle spasms.  Dispense: 90 tablet; Refill: 1 - gabapentin (NEURONTIN) 300 MG capsule; Take 1 capsule (300 mg total) by mouth 2 (two) times daily.  Dispense: 60 capsule; Refill: 2  2. Eye discharge Never completed treatment with previous  antibiotic which she lost. We'll treat presumptively for conjunctivitis. - erythromycin [facility]) ophthalmic ointment; Place 1 application into both eyes at bedtime.  Dispense: 3.5 g; Refill: 0   Meds ordered this encounter  Medications  . predniSONE (DELTASONE) 20 MG tablet    Sig: Take 1 tablet (20 mg total) by mouth daily with breakfast.    Dispense:  5 tablet    Refill:  0  . methocarbamol (ROBAXIN-750) 750 MG tablet    Sig: Take 1 tablet (750 mg total) by mouth every 8 (eight) hours as needed for muscle spasms.    Dispense:  90 tablet    Refill:  1  . gabapentin (NEURONTIN) 300 MG capsule    Sig: Take 1 capsule (300 mg total) by mouth 2 (two) times daily.    Dispense:  60 capsule    Refill:  2  . erythromycin (ROMYCIN) ophthalmic ointment    Sig: Place 1 application into both eyes at bedtime.    Dispense:  3.5 g    Refill:  0    Follow-up: Return in about 2 weeks (around 12/23/2015) for Follow-up  of low back pain.   Arnoldo Morale MD

## 2015-12-09 NOTE — Patient Instructions (Signed)

## 2015-12-13 ENCOUNTER — Ambulatory Visit: Payer: BC Managed Care – PPO | Admitting: Family Medicine

## 2015-12-17 NOTE — Telephone Encounter (Signed)
Pt called the office and stated that she needs to speak to PCP regarding the back pain that she continues to have. She has been taking her (new) meds but pain has gotten worst.  She is only able to sit and not experience pain. She can't wait until next week 7/27 appointment.  Please f/u Thank you, Venetia Night

## 2015-12-21 ENCOUNTER — Encounter: Payer: Self-pay | Admitting: Family Medicine

## 2015-12-21 ENCOUNTER — Ambulatory Visit: Payer: BC Managed Care – PPO | Attending: Family Medicine | Admitting: Family Medicine

## 2015-12-21 VITALS — BP 122/87 | HR 81 | Temp 97.9°F | Ht 70.0 in | Wt 220.8 lb

## 2015-12-21 DIAGNOSIS — M545 Low back pain, unspecified: Secondary | ICD-10-CM

## 2015-12-21 DIAGNOSIS — Z9889 Other specified postprocedural states: Secondary | ICD-10-CM | POA: Insufficient documentation

## 2015-12-21 DIAGNOSIS — F419 Anxiety disorder, unspecified: Secondary | ICD-10-CM | POA: Diagnosis not present

## 2015-12-21 DIAGNOSIS — F339 Major depressive disorder, recurrent, unspecified: Secondary | ICD-10-CM | POA: Insufficient documentation

## 2015-12-21 DIAGNOSIS — E669 Obesity, unspecified: Secondary | ICD-10-CM | POA: Insufficient documentation

## 2015-12-21 DIAGNOSIS — E0590 Thyrotoxicosis, unspecified without thyrotoxic crisis or storm: Secondary | ICD-10-CM

## 2015-12-21 DIAGNOSIS — R739 Hyperglycemia, unspecified: Secondary | ICD-10-CM | POA: Insufficient documentation

## 2015-12-21 DIAGNOSIS — J45909 Unspecified asthma, uncomplicated: Secondary | ICD-10-CM | POA: Diagnosis not present

## 2015-12-21 NOTE — Telephone Encounter (Signed)
Addressed at office visit today

## 2015-12-21 NOTE — Progress Notes (Signed)
Subjective:    Patient ID: Monica Chase, female    DOB: Oct 18, 1975, 40 y.o.   MRN: [id number]  HPI Monica Chase is a 40 year old female with a history of orthostatic hypotension, asthma, hyperthyroidism who comes into the clinic (after calling the clinic requesting an MRI of her back be ordered for back pain Prior to her last visit) MRI of her back had been denied by [facility] and I had explained to her at her last office visit that there had been no justification for an MRI at that time as she had not tried conservative measures yet.  She had informed me at her last visit the pain had been on for 3 years but then tells the nurse the pain has been on for the last 1 year and that I wouldn't order an MRI and had caused her to loose her job. Pain radiates up to her neck as a shooting sensation, she has associated muscle spasms and has had to use a walker to ambulate. I had placed her on Robaxin and gabapentin which she informs me today is too strong for her as they make her Unable to function and so she stopped it but then says Tylenol does not help either. Pain is relieved when she sleeps but exacerbated with ambulation. 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 Lumbar X-ray performed in 08/2015 was unremarkable.  She is requesting a referral to Endocrine for management of her Hyperthyroidism.    Past Medical History:  Diagnosis Date  . ANXIETY 07/26/2006  . ASTHMA, INTERMITTENT 07/26/2006  . CONSTIPATION, CHRONIC 06/22/2010  . DEPRESSION, MAJOR, RECURRENT 07/26/2006  . Dizziness 11/09/2010  . Fibroid   . HYPERGLYCEMIA 09/28/2009  . HYPERTHYROIDISM 07/20/2009  . OBESITY 01/31/2008    Past Surgical History:  Procedure Laterality Date  . BILATERAL SALPINGECTOMY Bilateral 07/07/2013   Procedure: BILATERAL  SALPINGECTOMY;  Surgeon: Donnamae Jude, MD;  Location: [facility];  Service: Gynecology;  Laterality: Bilateral;  . IUD REMOVAL  10/07   Mirena removed 05/10/2004  . PILONIDAL CYST EXCISION    . TUBAL LIGATION  2008  . VAGINAL DELIVERY     X 4  . VAGINAL HYSTERECTOMY N/A 07/07/2013   Procedure: HYSTERECTOMY VAGINAL;  Surgeon: Donnamae Jude, MD;  Location: [facility];  Service: Gynecology;  Laterality: N/A;     Review of Systems Constitutional: Negative for activity change and appetite change.  HENT: Negative for sinus pressure and sore throat.   Respiratory: Negative for chest tightness, shortness of breath and wheezing.   Cardiovascular: Negative for chest pain and palpitations.  Gastrointestinal: Negative for abdominal pain, constipation and abdominal distention.  Genitourinary: Negative.   Musculoskeletal:       See hpi  Psychiatric/Behavioral: Negative for behavioral problems and dysphoric mood.     Objective: Vitals:   12/21/15 1136  BP: 122/87  Pulse: 81  Temp: 97.9 F (36.6 C)  TempSrc: Oral  SpO2: 98%  Weight: 220 lb 12.8 oz (100.2 kg)  Height: 5\' 10"  (1.778 m)      Physical Exam  Constitutional: She is oriented to person, place, and time. She appears well-developed and well-nourished.  Cardiovascular: Normal rate, normal heart sounds and intact distal pulses.   No murmur heard. Pulmonary/Chest: Effort normal and breath sounds normal. She has no  wheezes. She has no rales. She exhibits no tenderness.  Abdominal: Soft. Bowel sounds are normal. She exhibits no distension and no mass. There is no tenderness.  Musculoskeletal: She exhibits tenderness (tenderness to palpation of the thoracolumbar spine, bilateral paraspinal muscles. Positive straight leg raise bilaterally.).  Neurological: She is alert and oriented to person, place, and time.  Ambulates with the aid of a walker No sensory loss in bilateral lower extremities       Assessment & Plan:  1. Midline low back pain  without sciatica She complains that the medications (gabapentin and Robaxin) are too strong for her even though I have informed her she can reduce the dose to a single daily dose at bedtime-she is not willing to do that. But then complains Tylenol and OTC patches do not work for her. I have had an extensive discussion with her about the fact that MRI will provide a diagnosis and pathways of management include surgical referral, pain control, physical therapy (if covered by insurance) Referred for MRI lumbar spine without contrast and appointment date obtained for the patient in the clinic for 12/29/15.  2. Hyperthyroidism She informs me her endocrinologist-Dr. Arnoldo Lenis "is not a good doctor" I had referred her in March to endocrine for a second opinion as requested: Referrals placed to [facility] endocrine (she complained appointment was to far off and declined), she was subsequently referred to [facility] [place] ([place] was [place] far [place]). She was finally referred to [facility] and I have provided her with the number to the practice and she knows to call them to obtain an appointment as the referral coordinator has made several attempts at securing a specialist visit for her which have failed.

## 2015-12-22 ENCOUNTER — Ambulatory Visit: Payer: BC Managed Care – PPO | Admitting: Endocrinology

## 2015-12-22 ENCOUNTER — Telehealth: Payer: Self-pay | Admitting: Family Medicine

## 2015-12-22 ENCOUNTER — Telehealth: Payer: Self-pay | Admitting: Endocrinology

## 2015-12-22 ENCOUNTER — Other Ambulatory Visit: Payer: Self-pay

## 2015-12-22 MED ORDER — PROPYLTHIOURACIL 50 MG PO TABS: 50.0000 mg | ORAL_TABLET | Freq: Every day | ORAL | 0 refills | Status: DC

## 2015-12-22 NOTE — Telephone Encounter (Signed)
Patient need a refill of medication propylthiouracil (PTU) 50 MG tablet.   [id number], [place] [address] [phone] (Phone) [phone] (Fax)

## 2015-12-22 NOTE — Telephone Encounter (Signed)
Spoke with Monica Chase,Monica Chase regarding the care she receives at [facility]. She stated that her issues have been resolved, and that she was still in pain. I scheduled her appointment for 7/25 with Dr.Amao to address her chronic back pain. After her visit on 7/25 I spoke with her to make sure her concerns were addressed.

## 2015-12-29 ENCOUNTER — Telehealth: Payer: Self-pay | Admitting: Family Medicine

## 2015-12-29 ENCOUNTER — Ambulatory Visit (HOSPITAL_COMMUNITY)
Admission: RE | Admit: 2015-12-29 | Discharge: 2015-12-29 | Disposition: A | Discharge status: Home / Self Care | Payer: BC Managed Care – PPO | Source: Ambulatory Visit | Attending: Family Medicine | Admitting: Family Medicine

## 2015-12-29 DIAGNOSIS — M545 Low back pain, unspecified: Secondary | ICD-10-CM

## 2015-12-29 NOTE — Telephone Encounter (Signed)
Pt called states she arrived at her MRI appt but is needing medication to calm her nerves.

## 2015-12-30 ENCOUNTER — Ambulatory Visit: Payer: BC Managed Care – PPO | Admitting: Family Medicine

## 2015-12-30 ENCOUNTER — Telehealth: Payer: Self-pay | Admitting: Family Medicine

## 2015-12-30 NOTE — Telephone Encounter (Signed)
Pt calling requesting to speak with Medical Director  Pt states she is unable to come to her appointment today, 8/3.  Pt states the stress from yesterday has taken a toll on her and she is not feeling well enough to come into appointment

## 2015-12-30 NOTE — Telephone Encounter (Signed)
She missed her scheduled appointment today.

## 2016-01-03 ENCOUNTER — Ambulatory Visit (HOSPITAL_COMMUNITY)
Admission: RE | Admit: 2016-01-03 | Discharge: 2016-01-03 | Disposition: A | Discharge status: Home / Self Care | Payer: BC Managed Care – PPO | Source: Ambulatory Visit | Attending: Family Medicine | Admitting: Family Medicine

## 2016-01-03 DIAGNOSIS — M899 Disorder of bone, unspecified: Secondary | ICD-10-CM | POA: Insufficient documentation

## 2016-01-03 DIAGNOSIS — M5136 Other intervertebral disc degeneration, lumbar region: Secondary | ICD-10-CM | POA: Insufficient documentation

## 2016-01-03 DIAGNOSIS — M545 Low back pain: Secondary | ICD-10-CM | POA: Diagnosis not present

## 2016-01-05 ENCOUNTER — Telehealth: Payer: Self-pay

## 2016-01-05 NOTE — Telephone Encounter (Signed)
Contacted pt to go over MRI results. Pt is aware of results and will be by to pick up a copy of the report

## 2016-01-07 ENCOUNTER — Encounter: Payer: Self-pay | Admitting: Family Medicine

## 2016-01-07 ENCOUNTER — Ambulatory Visit: Payer: BC Managed Care – PPO | Attending: Family Medicine | Admitting: Family Medicine

## 2016-01-07 VITALS — BP 99/64 | HR 67 | Temp 98.3°F | Ht 70.0 in | Wt 223.0 lb

## 2016-01-07 DIAGNOSIS — M545 Low back pain, unspecified: Secondary | ICD-10-CM

## 2016-01-07 DIAGNOSIS — D219 Benign neoplasm of connective and other soft tissue, unspecified: Secondary | ICD-10-CM | POA: Diagnosis not present

## 2016-01-07 DIAGNOSIS — Z90710 Acquired absence of both cervix and uterus: Secondary | ICD-10-CM | POA: Insufficient documentation

## 2016-01-07 DIAGNOSIS — F419 Anxiety disorder, unspecified: Secondary | ICD-10-CM | POA: Insufficient documentation

## 2016-01-07 DIAGNOSIS — Z9889 Other specified postprocedural states: Secondary | ICD-10-CM | POA: Insufficient documentation

## 2016-01-07 DIAGNOSIS — E669 Obesity, unspecified: Secondary | ICD-10-CM | POA: Diagnosis not present

## 2016-01-07 DIAGNOSIS — R42 Dizziness and giddiness: Secondary | ICD-10-CM | POA: Insufficient documentation

## 2016-01-07 DIAGNOSIS — F329 Major depressive disorder, single episode, unspecified: Secondary | ICD-10-CM | POA: Insufficient documentation

## 2016-01-07 DIAGNOSIS — Z880 Allergy status to penicillin: Secondary | ICD-10-CM | POA: Diagnosis not present

## 2016-01-07 DIAGNOSIS — J45909 Unspecified asthma, uncomplicated: Secondary | ICD-10-CM | POA: Insufficient documentation

## 2016-01-07 DIAGNOSIS — R739 Hyperglycemia, unspecified: Secondary | ICD-10-CM | POA: Diagnosis not present

## 2016-01-07 DIAGNOSIS — Z888 Allergy status to other drugs, medicaments and biological substances status: Secondary | ICD-10-CM | POA: Diagnosis not present

## 2016-01-07 DIAGNOSIS — E0590 Thyrotoxicosis, unspecified without thyrotoxic crisis or storm: Secondary | ICD-10-CM | POA: Insufficient documentation

## 2016-01-07 DIAGNOSIS — Z79899 Other long term (current) drug therapy: Secondary | ICD-10-CM | POA: Insufficient documentation

## 2016-01-07 MED ORDER — TRAMADOL HCL 50 MG PO TABS: 50.0000 mg | ORAL_TABLET | Freq: Two times a day (BID) | ORAL | 0 refills | Status: DC | PRN

## 2016-01-07 NOTE — Progress Notes (Signed)
Right leg weak

## 2016-01-07 NOTE — Patient Instructions (Signed)

## 2016-01-07 NOTE — Progress Notes (Signed)
Subjective:    Patient ID: Monica Chase, female    DOB: 10-06-1975, 40 y.o.   MRN: [id number]  HPI Please refer to last office notes from 12/21/15  In summary 40 year old female with low back pain here to discuss MRI results. She has complained of radiation of symptoms to thoracic spine on some occassions and to her right leg on other occasions. MRI reveals degenerative disc disease with mild impingement at L5-S1, spina bifida occulta; she remains on gabapentin and Robaxin which she is taking but states they do not help with the symptoms but had informed me at the last visit they were too strong for her.  No numbness or tingling in extremities, no falls, no loss of bowel sphincteric function, no foot drop.  Past Medical History:  Diagnosis Date  . ANXIETY 07/26/2006  . ASTHMA, INTERMITTENT 07/26/2006  . CONSTIPATION, CHRONIC 06/22/2010  . DEPRESSION, MAJOR, RECURRENT 07/26/2006  . Dizziness 11/09/2010  . Fibroid   . HYPERGLYCEMIA 09/28/2009  . HYPERTHYROIDISM 07/20/2009  . OBESITY 01/31/2008    Past Surgical History:  Procedure Laterality Date  . BILATERAL SALPINGECTOMY Bilateral 07/07/2013   Procedure: BILATERAL SALPINGECTOMY;  Surgeon: Donnamae Jude, MD;  Location: [facility];  Service: Gynecology;  Laterality: Bilateral;  . IUD REMOVAL  10/07   Mirena removed 05/10/2004  . PILONIDAL CYST EXCISION    . TUBAL LIGATION  2008  . VAGINAL DELIVERY     X 4  . VAGINAL HYSTERECTOMY N/A 07/07/2013   Procedure: HYSTERECTOMY VAGINAL;  Surgeon: Donnamae Jude, MD;  Location: [facility];  Service: Gynecology;  Laterality: N/A;    Allergies  Allergen Reactions  . Penicillins Other (See Comments)    Reaction unknown  . Bacitracin-Lidocaine [Lidocaine-Bacitracin] Rash    Current Outpatient Prescriptions on File Prior to Visit  Medication Sig Dispense Refill  . albuterol (PROVENTIL HFA;VENTOLIN HFA) 108 (90 Base) MCG/ACT inhaler Inhale 2 puffs into the lungs every 4 (four) hours as needed for  wheezing or shortness of breath. 1 Inhaler 0  . erythromycin (ROMYCIN) ophthalmic ointment Place 1 application into both eyes at bedtime. 3.5 g 0  . ferrous sulfate 325 (65 FE) MG tablet Take 325 mg by mouth daily with breakfast.    . gabapentin (NEURONTIN) 300 MG capsule Take 1 capsule (300 mg total) by mouth 2 (two) times daily. 60 capsule 2  . methocarbamol (ROBAXIN-750) 750 MG tablet Take 1 tablet (750 mg total) by mouth every 8 (eight) hours as needed for muscle spasms. 90 tablet 1  . midodrine (PROAMATINE) 5 MG tablet Take 1 tablet (5 mg total) by mouth 3 (three) times daily with meals. 90 tablet 3  . Multiple Vitamin (MULTIVITAMIN WITH MINERALS) TABS tablet Take 1 tablet by mouth daily.    Marland Kitchen propylthiouracil (PTU) 50 MG tablet Take 1 tablet (50 mg total) by mouth daily. 30 tablet 0  . ondansetron (ZOFRAN) 4 MG tablet Take 1 tablet (4 mg total) by mouth every 6 (six) hours. (Patient not taking: Reported on 12/09/2015) 12 tablet 0  . [DISCONTINUED] levocetirizine (XYZAL) 5 MG tablet Take 1 tablet (5 mg total) by mouth every evening. (Patient not taking: Reported on 10/13/2014) 30 tablet 0   No current facility-administered medications on file prior to visit.      Review of Systems Constitutional: Negative for activity change and appetite change.  HENT: Negative for sinus pressure and sore throat.   Respiratory: Negative for chest tightness, shortness of breath and wheezing.   Cardiovascular: Negative  for chest pain and palpitations.  Gastrointestinal: Negative for abdominal pain, constipation and abdominal distention.  Genitourinary: Negative.   Musculoskeletal:       See hpi  Psychiatric/Behavioral: Negative for behavioral problems and dysphoric mood.     Objective: Vitals:   01/07/16 1458  BP: 99/64  Pulse: 67  Temp: 98.3 F (36.8 C)  TempSrc: Oral  SpO2: (!) 67%  Weight: 223 lb (101.2 kg)  Height: 5\' 10"  (1.778 m)      Physical Exam Constitutional: She is oriented to  person, place, and time. She appears well-developed and well-nourished.  Cardiovascular: Normal rate, normal heart sounds and intact distal pulses.   No murmur heard. Pulmonary/Chest: Effort normal and breath sounds normal. She has no wheezes. She has no rales. She exhibits no tenderness.  Abdominal: Soft. Bowel sounds are normal. She exhibits no distension and no mass. There is no tenderness.  Musculoskeletal: She exhibits tenderness (tenderness to palpation of the thoracolumbar spine, bilateral paraspinal muscles. Positive straight leg raise bilaterally.).  Neurological: She is alert and oriented to person, place, and time.  Ambulates with the aid of a walker No sensory loss in bilateral lower extremities   CLINICAL DATA:  Low back pain that radiates down the right leg over the past 3 years.  EXAM: MRI LUMBAR SPINE WITHOUT CONTRAST  TECHNIQUE: Multiplanar, multisequence MR imaging of the lumbar spine was performed. No intravenous contrast was administered.  COMPARISON:  09/06/2015  FINDINGS: Segmentation: The lowest lumbar type non-rib-bearing vertebra is labeled as L5.  Alignment:  No vertebral subluxation is observed.  Vertebrae: Eccentric to the left in the T12 vertebral there is a 2.2 by 1.8 cm lesion with low T1 and low T2 signal characteristics (image 9/2) without associated edema on the inversion recovery weighted images. No definite differential sclerosis in this region on conventional radiographs.  Conus medullaris: Extends to the T12 level and appears normal.  Paraspinal and other soft tissues: Unremarkable  Disc levels:  L1-2: No impingement. Spina bifida occulta suspected at L1, eccentric to the left. On conventional radiographs, there is also suspicion for spina bifida occulta at T12.  L2-3:  Unremarkable.  L3-4:  Unremarkable.  L4-5:  No impingement.  Minimal left eccentric disc bulge.  L5-S1: Mild right foraminal stenosis along with  mild bilateral subarticular lateral recess stenosis due to disc bulge and mild intervertebral spurring.  IMPRESSION: 1. Degenerative disc disease causing mild right eccentric impingement at L5-S 1. 2. Spina bifida occulta is suspected at the T12 and L1 levels. 3. Low T1 and low T2 signal lesion eccentric to the left in the T12 vertebral body. Based on appearance I suspect this is much more likely to represent either a fibrous lesion or red marrow reconversion rather than osteoblastic metastatic lesion. If the patient has a history of malignancy, consider whole-body bone scan.   Electronically Signed   By: Van Clines M.D.   On: 01/03/2016 14:26       Assessment & Plan:  1. Midline low back pain without sciatica We have discussed the MRI report Tramadol added to regimen Continue gabapentin and Robaxin; may need ESI but will defer to new PCP I had a lengthy discussion with the patient and I will be transitioning her care to Dr. Doreene Burke who will assume her care going forward as I have explained to her it is important that she be satisfied with the care she is receiving from the clinic as she has made several complaints about myself, several office staff  and office processes while under my care which are a  reflection of the fact that she is not currently satisfied.

## 2016-01-14 ENCOUNTER — Other Ambulatory Visit: Payer: Self-pay | Admitting: Endocrinology

## 2016-01-14 ENCOUNTER — Other Ambulatory Visit: Payer: Self-pay | Admitting: Family Medicine

## 2016-01-14 ENCOUNTER — Telehealth: Payer: Self-pay | Admitting: Family Medicine

## 2016-01-14 DIAGNOSIS — I951 Orthostatic hypotension: Secondary | ICD-10-CM

## 2016-01-14 NOTE — Telephone Encounter (Signed)
Midodrine refilled

## 2016-01-14 NOTE — Telephone Encounter (Signed)
Please refill x 1 Ov is due  

## 2016-01-14 NOTE — Telephone Encounter (Signed)
Patient is requesting midodrine. Please follow up  [facility] on [address]

## 2016-01-17 ENCOUNTER — Ambulatory Visit: Payer: BC Managed Care – PPO | Admitting: Endocrinology

## 2016-01-20 ENCOUNTER — Telehealth: Payer: Self-pay

## 2016-01-20 ENCOUNTER — Ambulatory Visit (INDEPENDENT_AMBULATORY_CARE_PROVIDER_SITE_OTHER): Payer: BC Managed Care – PPO | Admitting: Endocrinology

## 2016-01-20 ENCOUNTER — Other Ambulatory Visit (INDEPENDENT_AMBULATORY_CARE_PROVIDER_SITE_OTHER): Payer: BC Managed Care – PPO

## 2016-01-20 ENCOUNTER — Encounter: Payer: Self-pay | Admitting: Endocrinology

## 2016-01-20 VITALS — BP 112/80

## 2016-01-20 DIAGNOSIS — E0590 Thyrotoxicosis, unspecified without thyrotoxic crisis or storm: Secondary | ICD-10-CM | POA: Diagnosis not present

## 2016-01-20 DIAGNOSIS — E559 Vitamin D deficiency, unspecified: Secondary | ICD-10-CM

## 2016-01-20 DIAGNOSIS — R252 Cramp and spasm: Secondary | ICD-10-CM

## 2016-01-20 LAB — BASIC METABOLIC PANEL
BUN: 6 mg/dL (ref 6–23)
CALCIUM: 8.8 mg/dL (ref 8.4–10.5)
CO2: 29 meq/L (ref 19–32)
CREATININE: 0.71 mg/dL (ref 0.40–1.20)
Chloride: 103 mEq/L (ref 96–112)
GFR: 118.54 mL/min (ref >60.00)
GLUCOSE: 80 mg/dL (ref 70–99)
Potassium: 4 mEq/L (ref 3.5–5.1)
Sodium: 138 mEq/L (ref 135–145)

## 2016-01-20 LAB — VITAMIN D 25 HYDROXY (VIT D DEFICIENCY, FRACTURES): VITD: 22.07 ng/mL — AB (ref 30.00–100.00)

## 2016-01-20 LAB — T4, FREE: Free T4: 0.66 ng/dL (ref 0.60–1.60)

## 2016-01-20 LAB — TSH: TSH: 1.25 u[IU]/mL (ref 0.35–4.50)

## 2016-01-20 NOTE — Telephone Encounter (Signed)
Called patient and gave lab results. Patient had no questions or concerns.  

## 2016-01-20 NOTE — Patient Instructions (Addendum)
blood tests are requested for you today.  We'll let you know about the results.   Please come back for a follow-up appointment in 6 months.   If ever you have fever while taking propylthiouracil, stop it and call us, even if the reason is obvious, because of the risk of a rare side-effect.

## 2016-01-20 NOTE — Progress Notes (Signed)
Subjective:    Patient ID: Monica Chase, female    DOB: 1975-11-04, 40 y.o.   MRN: [id number]  HPI Pt returns for f/u of hyperthyroidism (dx'ed 2008; it was first dx'ed during a pregnancy; she chose rx with tapazole; she has never had thyroid imaging; she has had TAH; she requested to change to PTU, as she felt tapazole is causing excessive diaphoresis).  pt reports 1 month of slight cramps of the hands, and assoc excessive diaphoresis.   Past Medical History:  Diagnosis Date  . ANXIETY 07/26/2006  . ASTHMA, INTERMITTENT 07/26/2006  . CONSTIPATION, CHRONIC 06/22/2010  . DEPRESSION, MAJOR, RECURRENT 07/26/2006  . Dizziness 11/09/2010  . Fibroid   . HYPERGLYCEMIA 09/28/2009  . HYPERTHYROIDISM 07/20/2009  . OBESITY 01/31/2008    Past Surgical History:  Procedure Laterality Date  . BILATERAL SALPINGECTOMY Bilateral 07/07/2013   Procedure: BILATERAL SALPINGECTOMY;  Surgeon: Donnamae Jude, MD;  Location: [facility];  Service: Gynecology;  Laterality: Bilateral;  . IUD REMOVAL  10/07   Mirena removed 05/10/2004  . PILONIDAL CYST EXCISION    . TUBAL LIGATION  2008  . VAGINAL DELIVERY     X 4  . VAGINAL HYSTERECTOMY N/A 07/07/2013   Procedure: HYSTERECTOMY VAGINAL;  Surgeon: Donnamae Jude, MD;  Location: [facility];  Service: Gynecology;  Laterality: N/A;    Social History   Social History  . Marital status: Divorced    Spouse name: N/A  . Number of children: 4  . Years of education: some coll.   Occupational History  . Not on file.   Social History Main Topics  . Smoking status: Former Smoker    Quit date: 11/08/2005  . Smokeless tobacco: Never Used  . Alcohol use No  . Drug use: No  . Sexual activity: Not on file   Other Topics Concern  . Not on file   Social History Narrative   Single   Patient drinks caffeine rarely.   Patient is right handed.     Current Outpatient Prescriptions on File Prior to Visit  Medication Sig Dispense Refill  . albuterol (PROVENTIL HFA;VENTOLIN  HFA) 108 (90 Base) MCG/ACT inhaler Inhale 2 puffs into the lungs every 4 (four) hours as needed for wheezing or shortness of breath. 1 Inhaler 0  . erythromycin (ROMYCIN) ophthalmic ointment Place 1 application into both eyes at bedtime. 3.5 g 0  . ferrous sulfate 325 (65 FE) MG tablet Take 325 mg by mouth daily with breakfast.    . gabapentin (NEURONTIN) 300 MG capsule Take 1 capsule (300 mg total) by mouth 2 (two) times daily. 60 capsule 2  . methocarbamol (ROBAXIN-750) 750 MG tablet Take 1 tablet (750 mg total) by mouth every 8 (eight) hours as needed for muscle spasms. 90 tablet 1  . midodrine (PROAMATINE) 5 MG tablet take 1 tablet by mouth three times a day with meals 90 tablet 2  . Multiple Vitamin (MULTIVITAMIN WITH MINERALS) TABS tablet Take 1 tablet by mouth daily.    . ondansetron (ZOFRAN) 4 MG tablet Take 1 tablet (4 mg total) by mouth every 6 (six) hours. 12 tablet 0  . propylthiouracil (PTU) 50 MG tablet take 1 tablet by mouth once daily 30 tablet 0  . traMADol (ULTRAM) 50 MG tablet Take 1 tablet (50 mg total) by mouth every 12 (twelve) hours as needed. 60 tablet 0  . [DISCONTINUED] levocetirizine (XYZAL) 5 MG tablet Take 1 tablet (5 mg total) by mouth every evening. (Patient not taking: Reported  on 10/13/2014) 30 tablet 0   No current facility-administered medications on file prior to visit.     Allergies  Allergen Reactions  . Penicillins Other (See Comments)    Reaction unknown  . Bacitracin-Lidocaine [Lidocaine-Bacitracin] Rash    Family History  Problem Relation Age of Onset  . Breast cancer Mother   . Hypertension Mother   . Diabetes Mother   . Hypertension Father   . Diabetes Father   . Breast cancer Maternal Aunt   . Ovarian cancer Maternal Aunt   . Hypertension Sister   . Hypertension Other   . Stomach cancer Paternal Grandmother     BP 112/80   LMP 06/18/2013   Review of Systems Denies fever and weight change    Objective:   Physical Exam VITAL SIGNS:   See vs page GENERAL: no distress NECK: Thyroid is approx twice normal size.  No thyroid nodule is palpable.  No palpable lymphadenopathy at the anterior neck. Skin: not diaphoretic.  Neuro: no tremor.      Lab Results  Component Value Date   TSH 1.25 01/20/2016      Assessment & Plan:  Hyperthyroidism: well-controlled.  Please continue the same medication Cramps, new, uncertain etiology.

## 2016-01-27 ENCOUNTER — Encounter: Payer: Self-pay | Admitting: Internal Medicine

## 2016-01-27 ENCOUNTER — Ambulatory Visit: Payer: BC Managed Care – PPO | Attending: Internal Medicine | Admitting: Internal Medicine

## 2016-01-27 ENCOUNTER — Telehealth: Payer: Self-pay | Admitting: Internal Medicine

## 2016-01-27 VITALS — BP 119/76 | HR 67 | Temp 97.6°F | Resp 18 | Ht 70.0 in | Wt 225.8 lb

## 2016-01-27 DIAGNOSIS — J0101 Acute recurrent maxillary sinusitis: Secondary | ICD-10-CM | POA: Diagnosis not present

## 2016-01-27 DIAGNOSIS — E0590 Thyrotoxicosis, unspecified without thyrotoxic crisis or storm: Secondary | ICD-10-CM | POA: Insufficient documentation

## 2016-01-27 DIAGNOSIS — Z880 Allergy status to penicillin: Secondary | ICD-10-CM | POA: Insufficient documentation

## 2016-01-27 DIAGNOSIS — M545 Low back pain, unspecified: Secondary | ICD-10-CM

## 2016-01-27 DIAGNOSIS — Q760 Spina bifida occulta: Secondary | ICD-10-CM | POA: Diagnosis not present

## 2016-01-27 DIAGNOSIS — I951 Orthostatic hypotension: Secondary | ICD-10-CM | POA: Diagnosis not present

## 2016-01-27 DIAGNOSIS — J45909 Unspecified asthma, uncomplicated: Secondary | ICD-10-CM | POA: Insufficient documentation

## 2016-01-27 MED ORDER — AZITHROMYCIN 250 MG PO TABS: ORAL_TABLET | ORAL | 0 refills | Status: DC

## 2016-01-27 NOTE — Progress Notes (Signed)
Monica Chase, is a 40 y.o. female  [id number]  [id number]  DOB - 1975/06/30  Chief Complaint  Patient presents with  . Establish Care     Subjective:   Monica Chase is a 40 y.o. female with history of chronic back pain, orthostatic hypotension, asthma, and hyperthyroidism here today to establish medical care with me. Patient has been seen in this clinic by another provider but preferred to switch provider recently. Her major concern today is ongoing back pain which has incapacitated her to the extent that she is not able to work, and she walks with a walker. She is distressed by this continued pain especially considering that she is so young. She is tearful during this encounter. Recent MRI of the lumbar spine showed:"1. Degenerative disc disease causing mild right eccentric impingement at L5-S 1. 2. Spina bifida occulta is suspected at the T12 and L1 levels. 3. Low T1 and low T2 signal lesion eccentric to the left in the T12 vertebral body. Based on appearance I suspect this is much more likely to represent either a fibrous lesion or red marrow reconversion rather than osteoblastic metastatic lesion. If the patient has a history of malignancy, consider whole-body bone scan". She desires a permanent solution, does not want to be on chronic pain medication and she is fearful for surgery. She is also complaining of sinus infection with maxillary pain and greenish yellow mucus from both nostrils. She denies any fever. She denies being depressed, she denies suicidal ideations or thoughts. Patient has No headache, No chest pain, No abdominal pain - No Nausea, No new weakness tingling or numbness, No Cough - SOB. She follows up with endocrinologist for hyperthyroidism. She is currently on propylthiouracil. She is allergic to penicillin. She does not smoke cigarettes, she does not drink alcohol, she denies any use of illicit drugs.  Problem  Spina Bifida Occulta  Midline Low Back Pain  Without Sciatica   ALLERGIES: Allergies  Allergen Reactions  . Penicillins Other (See Comments)    Reaction unknown  . Bacitracin-Lidocaine [Lidocaine-Bacitracin] Rash    PAST MEDICAL HISTORY: Past Medical History:  Diagnosis Date  . ANXIETY 07/26/2006  . ASTHMA, INTERMITTENT 07/26/2006  . CONSTIPATION, CHRONIC 06/22/2010  . DEPRESSION, MAJOR, RECURRENT 07/26/2006  . Dizziness 11/09/2010  . Fibroid   . HYPERGLYCEMIA 09/28/2009  . HYPERTHYROIDISM 07/20/2009  . OBESITY 01/31/2008   MEDICATIONS AT HOME: Prior to Admission medications   Medication Sig Start Date End Date Taking? Authorizing Provider  albuterol (PROVENTIL HFA;VENTOLIN HFA) 108 (90 Base) MCG/ACT inhaler Inhale 2 puffs into the lungs every 4 (four) hours as needed for wheezing or shortness of breath. 08/14/15  Yes Janne Napoleon, NP  erythromycin [facility]) ophthalmic ointment Place 1 application into both eyes at bedtime. 12/09/15  Yes Arnoldo Morale, MD  ferrous sulfate 325 (65 FE) MG tablet Take 325 mg by mouth daily with breakfast.   Yes Historical Provider, MD  gabapentin (NEURONTIN) 300 MG capsule Take 1 capsule (300 mg total) by mouth 2 (two) times daily. 12/09/15  Yes Arnoldo Morale, MD  methocarbamol (ROBAXIN-750) 750 MG tablet Take 1 tablet (750 mg total) by mouth every 8 (eight) hours as needed for muscle spasms. 12/09/15  Yes Arnoldo Morale, MD  midodrine (PROAMATINE) 5 MG tablet take 1 tablet by mouth three times a day with meals 01/14/16  Yes Arnoldo Morale, MD  Multiple Vitamin (MULTIVITAMIN WITH MINERALS) TABS tablet Take 1 tablet by mouth daily.   Yes Historical Provider, MD  ondansetron [facility]) 4  MG tablet Take 1 tablet (4 mg total) by mouth every 6 (six) hours. 11/28/15  Yes Janne Napoleon, NP  propylthiouracil (PTU) 50 MG tablet take 1 tablet by mouth once daily 01/17/16  Yes Renato Shin, MD  traMADol (ULTRAM) 50 MG tablet Take 1 tablet (50 mg total) by mouth every 12 (twelve) hours as needed. 01/07/16  Yes Arnoldo Morale, MD    azithromycin (ZITHROMAX) 250 MG tablet Take 2 tabs today, then one tab daily x 4 days from tomorrow 01/27/16   Tresa Garter, MD   Objective:   Vitals:   01/27/16 1020  BP: 119/76  Pulse: 67  Resp: 18  Temp: 97.6 F (36.4 C)  TempSrc: Oral  SpO2: 99%  Weight: 225 lb 12.8 oz (102.4 kg)  Height: 5\' 10"  (1.778 m)   Exam General appearance : Awake, alert, not in any distress. Speech Clear. Not toxic looking HEENT: Atraumatic and Normocephalic, pupils equally reactive to light and accomodation Neck: Supple, no JVD. No cervical lymphadenopathy.  Chest: Good air entry bilaterally, no added sounds  CVS: S1 S2 regular, no murmurs.  Abdomen: Bowel sounds present, Non tender and not distended with no gaurding, rigidity or rebound. Extremities: B/L Lower Ext shows no edema, both legs are warm to touch Neurology: Awake alert, and oriented X 3, CN II-XII intact, Non focal Skin: No Rash  Data Review Lab Results  Component Value Date   HGBA1C 4.90 07/12/2015   HGBA1C 5.0 11/09/2010    MRI LUMBAR SPINE WITHOUT CONTRAST  IMPRESSION: 1. Degenerative disc disease causing mild right eccentric impingement at L5-S 1. 2. Spina bifida occulta is suspected at the T12 and L1 levels. 3. Low T1 and low T2 signal lesion eccentric to the left in the T12 vertebral body. Based on appearance I suspect this is much more likely to represent either a fibrous lesion or red marrow reconversion rather than osteoblastic metastatic lesion. If the patient has a history of malignancy, consider whole-body bone scan.  Assessment & Plan   1. Midline low back pain without sciatica  - AMB referral to sports medicine  2. Spina bifida occulta  - AMB referral to sports medicine - Ambulatory referral to Neurosurgery  3. Acute recurrent maxillary sinusitis  - azithromycin (ZITHROMAX) 250 MG tablet; Take 2 tabs today, then one tab daily x 4 days from tomorrow  Dispense: 6 tablet; Refill: 0  Patient have  been counseled extensively about nutrition and exercise  Return for Follow up Pain and comorbidities, Generalized Anxiety Disorder.  The patient was given clear instructions to go to ER or return to medical center if symptoms don't improve, worsen or new problems develop. The patient verbalized understanding. The patient was told to call to get lab results if they haven't heard anything in the next week.   This note has been created with Surveyor, quantity. Any transcriptional errors are unintentional.    Angelica Chessman, MD, [place], [place], [place], [place] and [facility] [place], [place]   01/27/2016, 11:22 AM

## 2016-01-27 NOTE — Telephone Encounter (Signed)
Pt states she was supposed to receive a referral to [facility] to receive a light weight walker  Pt wants to know the status of this order Pt would like to be called with information  Thank you

## 2016-01-27 NOTE — Progress Notes (Signed)
Patient is here to est care  Patient denies pain at this time.  Patient declines the flu shot today.  Patient has not eaten nor taken medication today.

## 2016-01-27 NOTE — Patient Instructions (Signed)

## 2016-01-28 NOTE — Telephone Encounter (Signed)
Pt. Called to know the status of the order for her to be able to receive a walker from [place]. Please f/u with pt.

## 2016-01-31 ENCOUNTER — Emergency Department (HOSPITAL_COMMUNITY)
Admission: EM | Admit: 2016-01-31 | Discharge: 2016-01-31 | Disposition: A | Discharge status: Home / Self Care | Payer: Medicaid Other | Attending: Emergency Medicine | Admitting: Emergency Medicine

## 2016-01-31 ENCOUNTER — Encounter (HOSPITAL_COMMUNITY): Payer: Self-pay | Admitting: Emergency Medicine

## 2016-01-31 DIAGNOSIS — J45909 Unspecified asthma, uncomplicated: Secondary | ICD-10-CM | POA: Diagnosis not present

## 2016-01-31 DIAGNOSIS — Z87891 Personal history of nicotine dependence: Secondary | ICD-10-CM | POA: Insufficient documentation

## 2016-01-31 DIAGNOSIS — R21 Rash and other nonspecific skin eruption: Secondary | ICD-10-CM | POA: Insufficient documentation

## 2016-01-31 DIAGNOSIS — J0111 Acute recurrent frontal sinusitis: Secondary | ICD-10-CM | POA: Insufficient documentation

## 2016-01-31 MED ORDER — HYDROCORTISONE 1 % EX OINT
TOPICAL_OINTMENT | Freq: Once | CUTANEOUS | Status: AC
  Administered 2016-01-31: 1 via TOPICAL
  Filled 2016-01-31: qty 28.35

## 2016-01-31 MED ORDER — DOXYCYCLINE HYCLATE 100 MG PO TABS
100.0000 mg | ORAL_TABLET | Freq: Once | ORAL | Status: AC
  Administered 2016-01-31: 100 mg via ORAL
  Filled 2016-01-31: qty 1

## 2016-01-31 MED ORDER — DOXYCYCLINE HYCLATE 100 MG PO CAPS: 100.0000 mg | ORAL_CAPSULE | Freq: Two times a day (BID) | ORAL | 0 refills | Status: DC

## 2016-01-31 MED ORDER — LORATADINE 10 MG PO TABS
10.0000 mg | ORAL_TABLET | Freq: Once | ORAL | Status: AC
  Administered 2016-01-31: 10 mg via ORAL
  Filled 2016-01-31: qty 1

## 2016-01-31 NOTE — ED Triage Notes (Signed)
Pt to ER by private vehicle with complaint of rash to left upper chest, right neck, and right forearm. Pt reports this rash began Friday after taking initial dose of azithromycin for sinus infection. Pt reports due to rash onset she has not taken anymore of the medication. A/o x4. NAD.

## 2016-01-31 NOTE — ED Provider Notes (Signed)
[facility] DEPT Provider Note   CSN: [id number] Arrival date & time: 01/31/16  0915     History   Chief Complaint Chief Complaint  Patient presents with  . Rash    HPI  Blood pressure 137/63, pulse 65, temperature 97.6 F (36.4 C), temperature source Oral, resp. rate 18, last menstrual period 06/18/2013, SpO2 99 %.  Monica Chase is a 40 y.o. female complaining of reticulocyte rash onset several days ago after initiating azithromycin for sinus infection, she had only one dose to 500 mg, the rash is on the bilateral upper extremities and chest there is no associated fever, chills, nausea, vomiting, chest pain, lip or tongue swelling, shortness of breath. She reports sinus headache and pressure, multiple episodes of sinus infection the past, no fever but she does endorse chills on review of systems. Denies new medications, food, soap, lotions, laundry detergent, pets, N/V, dyspepsia, CP, SOB, wheeze, lip or tongue swelling, fever, h/o anaphylactic reaction.   HPI  Past Medical History:  Diagnosis Date  . ANXIETY 07/26/2006  . ASTHMA, INTERMITTENT 07/26/2006  . CONSTIPATION, CHRONIC 06/22/2010  . DEPRESSION, MAJOR, RECURRENT 07/26/2006  . Dizziness 11/09/2010  . Fibroid   . HYPERGLYCEMIA 09/28/2009  . HYPERTHYROIDISM 07/20/2009  . OBESITY 01/31/2008    Patient Active Problem List   Diagnosis Date Noted  . 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 Muscle cramps 01/20/2016  . Vitamin D deficiency 08/27/2015  . Midline low back pain without sciatica 08/26/2015  . Orthostatic hypotension 02/16/2015  . Near syncope 02/05/2015  . Headache 02/05/2015  . Atypical chest pain 08/05/2014  . Herpes zoster 12/01/2013  . Benign paroxysmal positional vertigo 11/13/2013  . Hemorrhoid 04/16/2013  . Hypercholesterolemia 08/05/2012  . Dental caries 03/19/2012  . Dizziness 11/09/2010  . CONSTIPATION, CHRONIC 06/22/2010  . Hyperthyroidism 07/20/2009  . Obesity 01/31/2008  . Episodic mood  disorder ([place]) 07/26/2006  . Anxiety state 07/26/2006  . ASTHMA, INTERMITTENT 07/26/2006    Past Surgical History:  Procedure Laterality Date  . BILATERAL SALPINGECTOMY Bilateral 07/07/2013   Procedure: BILATERAL SALPINGECTOMY;  Surgeon: Donnamae Jude, MD;  Location: [facility];  Service: Gynecology;  Laterality: Bilateral;  . IUD REMOVAL  10/07   Mirena removed 05/10/2004  . PILONIDAL CYST EXCISION    . TUBAL LIGATION  2008  . VAGINAL DELIVERY     X 4  . VAGINAL HYSTERECTOMY N/A 07/07/2013   Procedure: HYSTERECTOMY VAGINAL;  Surgeon: Donnamae Jude, MD;  Location: [facility];  Service: Gynecology;  Laterality: N/A;    OB History    Gravida Para Term Preterm AB Living   4 4 4     4    SAB TAB Ectopic Multiple Live Births                   Home Medications    Prior to Admission medications   Medication Sig Start Date End Date Taking? Authorizing Provider  albuterol (PROVENTIL HFA;VENTOLIN HFA) 108 (90 Base) MCG/ACT inhaler Inhale 2 puffs into the lungs every 4 (four) hours as needed for wheezing or shortness of breath. 08/14/15   Janne Napoleon, NP  doxycycline (VIBRAMYCIN) 100 MG capsule Take 1 capsule (100 mg total) by mouth 2 (two) times daily. 01/31/16   Latif Nazareno, PA-C  ferrous sulfate 325 (65 FE) MG tablet Take 325 mg by mouth daily with breakfast.    Historical Provider, MD  gabapentin (NEURONTIN) 300 MG capsule Take 1 capsule (300 mg total) by mouth 2 (two) times daily. 12/09/15  Arnoldo Morale, MD  methocarbamol (ROBAXIN-750) 750 MG tablet Take 1 tablet (750 mg total) by mouth every 8 (eight) hours as needed for muscle spasms. 12/09/15   Arnoldo Morale, MD  midodrine (PROAMATINE) 5 MG tablet take 1 tablet by mouth three times a day with meals 01/14/16   Arnoldo Morale, MD  Multiple Vitamin (MULTIVITAMIN WITH MINERALS) TABS tablet Take 1 tablet by mouth daily.    Historical Provider, MD  ondansetron (ZOFRAN) 4 MG tablet Take 1 tablet (4 mg total) by mouth every 6 (six) hours. 11/28/15   Janne Napoleon, NP  propylthiouracil (PTU) 50 MG tablet take 1 tablet by mouth once daily 01/17/16   Renato Shin, MD  traMADol (ULTRAM) 50 MG tablet Take 1 tablet (50 mg total) by mouth every 12 (twelve) hours as needed. 01/07/16   Arnoldo Morale, MD    Family History Family History  Problem Relation Age of Onset  . Breast cancer Mother   . Hypertension Mother   . Diabetes Mother   . Hypertension Father   . Diabetes Father   . Breast cancer Maternal Aunt   . Ovarian cancer Maternal Aunt   . Hypertension Sister   . Hypertension Other   . Stomach cancer Paternal Grandmother     Social History Social History  Substance Use Topics  . Smoking status: Former Smoker    Quit date: 11/08/2005  . Smokeless tobacco: Never Used  . Alcohol use No     Allergies   Penicillins; Azithromycin; and Bacitracin-lidocaine [lidocaine-bacitracin]   Review of Systems Review of Systems  10 systems reviewed and found to be negative, except as noted in the HPI.   Physical Exam Updated Vital Signs BP 137/63 (BP Location: Right Arm)   Pulse 65   Temp 97.6 F (36.4 C) (Oral)   Resp 18   LMP 06/18/2013   SpO2 99%   Physical Exam  Constitutional: She is oriented to person, place, and time. She appears well-developed and well-nourished. No distress.  HENT:  Head: Normocephalic and atraumatic.  Mouth/Throat: Oropharynx is clear and moist.  No drooling or stridor. Posterior pharynx mildly erythematous no significant tonsillar hypertrophy. No exudate. Soft palate rises symmetrically. No TTP or induration under tongue.   I'll tenderness to palpation of frontal sinuses.  No mucosal edema in the nares.  Bilateral tympanic membranes with normal architecture and good light reflex.   Eyes: Conjunctivae and EOM are normal. Pupils are equal, round, and reactive to light.  Neck: Normal range of motion.  Cardiovascular: Normal rate, regular rhythm and intact distal pulses.   Pulmonary/Chest: Effort normal and  breath sounds normal. No respiratory distress. She has no wheezes. She has no rales. She exhibits no tenderness.  Abdominal: Soft. There is no tenderness.  Musculoskeletal: Normal range of motion.  Neurological: She is alert and oriented to person, place, and time.  Skin: Rash noted. She is not diaphoretic.  Hive-like rash with mild excoriations to chest and bilateral upper extremities and on left thigh. No warmth, tenderness palpation or discharge, lesions are blanchable and spare the palms soles and mucous membranes.  Psychiatric: She has a normal mood and affect.  Nursing note and vitals reviewed.    ED Treatments / Results  Labs (all labs ordered are listed, but only abnormal results are displayed) Labs Reviewed - No data to display  EKG  EKG Interpretation None       Radiology No results found.  Procedures Procedures (including critical care time)  Medications Ordered in  ED Medications  hydrocortisone 1 % ointment (not administered)  doxycycline (VIBRA-TABS) tablet 100 mg (100 mg Oral Given 01/31/16 1024)  loratadine (CLARITIN) tablet 10 mg (10 mg Oral Given 01/31/16 1024)     Initial Impression / Assessment and Plan / ED Course  I have reviewed the triage vital signs and the nursing notes.  Pertinent labs & imaging results that were available during my care of the patient were reviewed by me and considered in my medical decision making (see chart for details).  Clinical Course    Vitals:   01/31/16 0926  BP: 137/63  Pulse: 65  Resp: 18  Temp: 97.6 F (36.4 C)  TempSrc: Oral  SpO2: 99%    Medications  hydrocortisone 1 % ointment (not administered)  doxycycline (VIBRA-TABS) tablet 100 mg (100 mg Oral Given 01/31/16 1024)  loratadine (CLARITIN) tablet 10 mg (10 mg Oral Given 01/31/16 1024)    Monica Chase is 40 y.o. female presenting with A reticulocyte rash onset several days ago after she started taking azithromycin for sinus infection, patient is  overall quite well appearing she does have some mild frontal tenderness with associated subjective chills. I've advised her that is probably not indicated that she have any antibiotics at this point but she would like to have an alternative antibiotic, will start on doxycycline. She does have a rash consistent with hives, it is possible that is from the azithromycin, there is been no new environmental exposures. Advised her to take either Benadryl or Claritin and use hydrocortisone ointment. No signs of secondary organ involvement that would necessitate epinephrine for anaphylaxis.  Evaluation does not show pathology that would require ongoing emergent intervention or inpatient treatment. Pt is hemodynamically stable and mentating appropriately. Discussed findings and plan with patient/guardian, who agrees with care plan. All questions answered. Return precautions discussed and outpatient follow up given.     Final Clinical Impressions(s) / ED Diagnoses   Final diagnoses:  Rash and nonspecific skin eruption  Acute recurrent frontal sinusitis    New Prescriptions New Prescriptions   DOXYCYCLINE (VIBRAMYCIN) 100 MG CAPSULE    Take 1 capsule (100 mg total) by mouth 2 (two) times daily.     Monico Blitz, PA-C 01/31/16 [address] PA-C 01/31/16 1039    Orlie Dakin, MD 01/31/16 1704

## 2016-01-31 NOTE — Discharge Instructions (Signed)
1 to 2 tablets of 25 mg Benadryl pills every 4-6 hours as needed to a maximum of 300 mg per day. In addition, you may apply a topical hydrocortisone ointment to all affected areas except for the face.  ° °Do not hesitate to call 911 or return to the emergency room if you develop any shortness of breath, wheezing, tongue or lip swelling. ° °

## 2016-02-04 ENCOUNTER — Telehealth: Payer: Self-pay | Admitting: *Deleted

## 2016-02-04 NOTE — Telephone Encounter (Signed)
Medical Assistant left message on patient's home and cell voicemail. Voicemail states to give a call back to Dayshaun Whobrey with [facility] at [phone].  

## 2016-02-04 NOTE — Telephone Encounter (Signed)
Patient verified DOB Patient is requesting a manual walker prescription. Patient would like prescription faxed to [facility] once completed instead of being placed at the front desk for patient pickup. Patient is aware of receiving a FU phone call informing her of prescription being faxed once completed. No further questions at this time.

## 2016-02-09 ENCOUNTER — Telehealth: Payer: Self-pay | Admitting: Internal Medicine

## 2016-02-09 NOTE — Telephone Encounter (Signed)
Pt called states her apartment complex has faxed [place] documentation about her SCAT. Pt was advised that paperwork turn around is 7-14 business days and once paperwork is complete we will fax and contact pt .

## 2016-02-09 NOTE — Telephone Encounter (Signed)
MA received paperwork. Once completed paperwork will be placed at the front desk for patient to pickup.

## 2016-02-20 ENCOUNTER — Encounter (HOSPITAL_COMMUNITY): Payer: Self-pay | Admitting: *Deleted

## 2016-02-20 ENCOUNTER — Ambulatory Visit (HOSPITAL_COMMUNITY)
Admission: EM | Admit: 2016-02-20 | Discharge: 2016-02-20 | Disposition: A | Discharge status: Home / Self Care | Payer: Medicaid Other | Attending: Internal Medicine | Admitting: Internal Medicine

## 2016-02-20 DIAGNOSIS — H1013 Acute atopic conjunctivitis, bilateral: Secondary | ICD-10-CM | POA: Diagnosis not present

## 2016-02-20 MED ORDER — LORATADINE-PSEUDOEPHEDRINE ER 10-240 MG PO TB24
1.0000 | ORAL_TABLET | Freq: Every day | ORAL | 0 refills | Status: AC
Start: 2016-02-20 — End: 2016-03-06

## 2016-02-20 MED ORDER — TRIAMCINOLONE ACETONIDE 55 MCG/ACT NA AERO: 2.0000 | INHALATION_SPRAY | Freq: Every day | NASAL | 0 refills | Status: DC

## 2016-02-20 NOTE — ED Triage Notes (Signed)
C/O recurrent bilat eye irritation, swelling, blurred vision, burning.  Originally started approx 1 mo ago; PCP prescribed eye gtts, then changed to eye oint due to no improvement.  Pt then had started a Z-pack for sinus infection, had allergic reaction, then completed course of doxycycline.  Eye sxs resolved.  Approx 1 wk ago, sxs started again.  Saw [facility] 3 days ago - was given Pazo?Marland Kitchen  Pruritis resolved, but c/o blurred vision, swelling, a "greenish-yellow film" over bilat eyes.

## 2016-02-20 NOTE — Discharge Instructions (Addendum)
Pazeo works slowly.  Try adding claritin D to nasal steroid and Pazeo eye drops.  Try changing flonase to nasacort nasal steroid to see if less headache.  Followup with eye doctor as planned in 2 weeks.

## 2016-02-20 NOTE — ED Provider Notes (Signed)
[facility]    CSN: [id number] Arrival date & time: 02/20/16  1301  First Provider Contact:  First MD Initiated Contact with Patient 02/20/16 1514        History   Chief Complaint Chief Complaint  Patient presents with  . Eye Problem    HPI Monica Chase is a 40 y.o. female. She presents today with some frustration over allergic conjunctivitis. She saw the eye doctor a couple days ago and was started on olapatadine (Pazeo) eyedrops and Flonase. She continues to have itching of the eyelids, and rubbing them has made them sore. She has puffiness and mild erythema of the lids, and dark circles under her eyes. She has a little bit of film blurring her vision, this improves temporarily when she blinks. She has runny/congested nose, postnasal drainage, slight cough. No sore throat, no fever. Thinks the Flonase might be giving her a little bit of headache, lasts for about 30 minutes after she uses it.    HPI  Past Medical History:  Diagnosis Date  . ANXIETY 07/26/2006  . ASTHMA, INTERMITTENT 07/26/2006  . CONSTIPATION, CHRONIC 06/22/2010  . DEPRESSION, MAJOR, RECURRENT 07/26/2006  . Dizziness 11/09/2010  . Fibroid   . HYPERGLYCEMIA 09/28/2009  . HYPERTHYROIDISM 07/20/2009  . OBESITY 01/31/2008    Patient Active Problem List   Diagnosis Date Noted  . 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 Muscle cramps 01/20/2016  . Vitamin D deficiency 08/27/2015  . Midline low back pain without sciatica 08/26/2015  . Orthostatic hypotension 02/16/2015  . Near syncope 02/05/2015  . Headache 02/05/2015  . Atypical chest pain 08/05/2014  . Herpes zoster 12/01/2013  . Benign paroxysmal positional vertigo 11/13/2013  . Hemorrhoid 04/16/2013  . Hypercholesterolemia 08/05/2012  . Dental caries 03/19/2012  . Dizziness 11/09/2010  . CONSTIPATION, CHRONIC 06/22/2010  . Hyperthyroidism 07/20/2009  . Obesity 01/31/2008  . Episodic mood disorder ([place]) 07/26/2006  . Anxiety state 07/26/2006    . ASTHMA, INTERMITTENT 07/26/2006    Past Surgical History:  Procedure Laterality Date  . ABDOMINAL HYSTERECTOMY    . BILATERAL SALPINGECTOMY Bilateral 07/07/2013   Procedure: BILATERAL SALPINGECTOMY;  Surgeon: Donnamae Jude, MD;  Location: [facility];  Service: Gynecology;  Laterality: Bilateral;  . IUD REMOVAL  10/07   Mirena removed 05/10/2004  . PILONIDAL CYST EXCISION    . TUBAL LIGATION  2008  . VAGINAL DELIVERY     X 4  . VAGINAL HYSTERECTOMY N/A 07/07/2013   Procedure: HYSTERECTOMY VAGINAL;  Surgeon: Donnamae Jude, MD;  Location: [facility];  Service: Gynecology;  Laterality: N/A;    OB History    Gravida Para Term Preterm AB Living   4 4 4     4    SAB TAB Ectopic Multiple Live Births                   Home Medications    Prior to Admission medications   Medication Sig Start Date End Date Taking? Authorizing Provider  albuterol (PROVENTIL HFA;VENTOLIN HFA) 108 (90 Base) MCG/ACT inhaler Inhale 2 puffs into the lungs every 4 (four) hours as needed for wheezing or shortness of breath. 08/14/15  Yes Janne Napoleon, NP  Cholecalciferol (VITAMIN D PO) Take by mouth.   Yes Historical Provider, MD  ferrous sulfate 325 (65 FE) MG tablet Take 325 mg by mouth daily with breakfast.   Yes Historical Provider, MD  midodrine (PROAMATINE) 5 MG tablet take 1 tablet by mouth three times a day with meals  01/14/16  Yes Arnoldo Morale, MD  propylthiouracil (PTU) 50 MG tablet take 1 tablet by mouth once daily 01/17/16  Yes Renato Shin, MD  doxycycline (VIBRAMYCIN) 100 MG capsule Take 1 capsule (100 mg total) by mouth 2 (two) times daily. 01/31/16   Nicole Pisciotta, PA-C  gabapentin (NEURONTIN) 300 MG capsule Take 1 capsule (300 mg total) by mouth 2 (two) times daily. 12/09/15   Arnoldo Morale, MD  methocarbamol (ROBAXIN-750) 750 MG tablet Take 1 tablet (750 mg total) by mouth every 8 (eight) hours as needed for muscle spasms. 12/09/15   Arnoldo Morale, MD  Multiple Vitamin (MULTIVITAMIN WITH MINERALS) TABS tablet  Take 1 tablet by mouth daily.    Historical Provider, MD  ondansetron (ZOFRAN) 4 MG tablet Take 1 tablet (4 mg total) by mouth every 6 (six) hours. 11/28/15   Janne Napoleon, NP  traMADol (ULTRAM) 50 MG tablet Take 1 tablet (50 mg total) by mouth every 12 (twelve) hours as needed. 01/07/16   Arnoldo Morale, MD    Family History Family History  Problem Relation Age of Onset  . Breast cancer Mother   . Hypertension Mother   . Diabetes Mother   . Hypertension Father   . Diabetes Father   . Breast cancer Maternal Aunt   . Ovarian cancer Maternal Aunt   . Hypertension Sister   . Hypertension Other   . Stomach cancer Paternal Grandmother     Social History Social History  Substance Use Topics  . Smoking status: Former Smoker    Quit date: 11/08/2005  . Smokeless tobacco: Never Used  . Alcohol use No     Allergies   Penicillins; Azithromycin; and Bacitracin-lidocaine [lidocaine-bacitracin]   Review of Systems Review of Systems  All other systems reviewed and are negative.    Physical Exam Triage Vital Signs ED Triage Vitals  Enc Vitals Group     BP 02/20/16 1502 123/71     Pulse Rate 02/20/16 1502 66     Resp 02/20/16 1502 16     Temp 02/20/16 1502 97.8 F (36.6 C)     Temp Source 02/20/16 1502 Oral     SpO2 02/20/16 1502 99 %     Weight --      Height --      Pain Score 02/20/16 1503 0   Updated Vital Signs BP 123/71   Pulse 66   Temp 97.8 F (36.6 C) (Oral)   Resp 16   LMP 06/18/2013   SpO2 99%   Visual Acuity Right Eye Distance: 20/25 Left Eye Distance: 20/25 Bilateral Distance: 20/25  Physical Exam  Constitutional: She is oriented to person, place, and time. No distress.  Alert, nicely groomed  HENT:  Head: Atraumatic.  Bilateral TMs are moderately dull, left is red tinged marked nasal congestion on the left, occlusion Moderate nasal congestion on the right Pale nasal mucosa Throat is pink   Eyes:  Conjugate gaze with mild conjunctival injection  bilaterally No frank discharge at this time Upper and lower lids are slightly puffy, with mild hyperpigmentation bilaterally Dark circles under both eyes  Neck: Neck supple.  Cardiovascular: Normal rate.   Pulmonary/Chest: No respiratory distress.  Abdominal: She exhibits no distension.  Musculoskeletal: Normal range of motion.  No leg swelling  Neurological: She is alert and oriented to person, place, and time.  Skin: Skin is warm and dry.  No cyanosis  Nursing note and vitals reviewed.    UC Treatments / Results   Procedures Procedures (including  critical care time)   Final Clinical Impressions(s) / UC Diagnoses   Final diagnoses:  Allergic conjunctivitis, bilateral   Pazeo works slowly.  Try adding claritin D to nasal steroid and Pazeo eye drops.  Try changing flonase to nasacort nasal steroid to see if less headache.  Followup with eye doctor as planned in 2 weeks.    New Prescriptions Discharge Medication List as of 02/20/2016  3:29 PM    START taking these medications   Details  loratadine-pseudoephedrine (CLARITIN-D 24 HOUR) 10-240 MG 24 hr tablet Take 1 tablet by mouth daily., Starting Sun 02/20/2016, Until Mon 03/06/2016, Normal    triamcinolone (NASACORT AQ) 55 MCG/ACT AERO nasal inhaler Place 2 sprays into the nose daily., Starting Sun 02/20/2016, Normal         Sherlene Shams, MD 02/22/16 [phone]

## 2016-02-23 ENCOUNTER — Other Ambulatory Visit: Payer: Self-pay | Admitting: Endocrinology

## 2016-02-25 ENCOUNTER — Encounter (HOSPITAL_COMMUNITY): Payer: Self-pay | Admitting: Emergency Medicine

## 2016-02-25 ENCOUNTER — Emergency Department (HOSPITAL_COMMUNITY)
Admission: EM | Admit: 2016-02-25 | Discharge: 2016-02-25 | Disposition: A | Discharge status: Home / Self Care | Payer: Medicaid Other | Attending: Emergency Medicine | Admitting: Emergency Medicine

## 2016-02-25 DIAGNOSIS — Z87891 Personal history of nicotine dependence: Secondary | ICD-10-CM | POA: Insufficient documentation

## 2016-02-25 DIAGNOSIS — G8929 Other chronic pain: Secondary | ICD-10-CM

## 2016-02-25 DIAGNOSIS — M549 Dorsalgia, unspecified: Secondary | ICD-10-CM | POA: Diagnosis not present

## 2016-02-25 DIAGNOSIS — F419 Anxiety disorder, unspecified: Secondary | ICD-10-CM | POA: Insufficient documentation

## 2016-02-25 DIAGNOSIS — M542 Cervicalgia: Secondary | ICD-10-CM | POA: Insufficient documentation

## 2016-02-25 DIAGNOSIS — J45909 Unspecified asthma, uncomplicated: Secondary | ICD-10-CM | POA: Insufficient documentation

## 2016-02-25 MED ORDER — DIAZEPAM 2 MG PO TABS: 2.0000 mg | ORAL_TABLET | Freq: Two times a day (BID) | ORAL | 0 refills | Status: DC

## 2016-02-25 MED ORDER — DIAZEPAM 2 MG PO TABS
2.0000 mg | ORAL_TABLET | Freq: Once | ORAL | Status: AC
  Administered 2016-02-25: 2 mg via ORAL
  Filled 2016-02-25: qty 1

## 2016-02-25 NOTE — ED Provider Notes (Signed)
[facility] DEPT Provider Note   CSN: [id number] Arrival date & time: 02/25/16  1200     History   Chief Complaint Chief Complaint  Patient presents with  . Back Pain  . Anxiety    per patient.     HPI Monica Chase is a 40 y.o. female.  Patient presents to the emergency department with chief complaint of 2 complaints.  1. Chronic back and neck pain: She states that she has been having chronic neck and back pain. She has tried managing her symptoms with tramadol and muscle relaxers. She states that she thinks she has pinched nerve. She has been seen by a "back specialist,"  and is scheduled to start physical therapy next week. She denies any bowel or bladder incontinence. Denies any numbness or tingling, but states she does have occasional weakness sensation in her lower extremities. She ambulates at baseline with a walker.  2. Anxiety: The patient reports that she felt like she was having a panic attack today. She states that her anxiety is exacerbated by the fact that she does not have a job, and is having chronic back pain. She states that the symptoms caused her to not be able to work. She is concerned because she is not able to take care of her kids like she would like to. She states that she does not want to be "open up on medicine." She states that she would like to find a solution, and all of this is causing her to feel more anxious than normal.   The history is provided by the patient. No language interpreter was used.    Past Medical History:  Diagnosis Date  . ANXIETY 07/26/2006  . ASTHMA, INTERMITTENT 07/26/2006  . CONSTIPATION, CHRONIC 06/22/2010  . DEPRESSION, MAJOR, RECURRENT 07/26/2006  . Dizziness 11/09/2010  . Fibroid   . HYPERGLYCEMIA 09/28/2009  . HYPERTHYROIDISM 07/20/2009  . OBESITY 01/31/2008    Patient Active Problem List   Diagnosis Date Noted  . 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 Muscle cramps 01/20/2016  . Vitamin D deficiency 08/27/2015  .  Midline low back pain without sciatica 08/26/2015  . Orthostatic hypotension 02/16/2015  . Near syncope 02/05/2015  . Headache 02/05/2015  . Atypical chest pain 08/05/2014  . Herpes zoster 12/01/2013  . Benign paroxysmal positional vertigo 11/13/2013  . Hemorrhoid 04/16/2013  . Hypercholesterolemia 08/05/2012  . Dental caries 03/19/2012  . Dizziness 11/09/2010  . CONSTIPATION, CHRONIC 06/22/2010  . Hyperthyroidism 07/20/2009  . Obesity 01/31/2008  . Episodic mood disorder ([place]) 07/26/2006  . Anxiety state 07/26/2006  . ASTHMA, INTERMITTENT 07/26/2006    Past Surgical History:  Procedure Laterality Date  . ABDOMINAL HYSTERECTOMY    . BILATERAL SALPINGECTOMY Bilateral 07/07/2013   Procedure: BILATERAL SALPINGECTOMY;  Surgeon: Donnamae Jude, MD;  Location: [facility];  Service: Gynecology;  Laterality: Bilateral;  . IUD REMOVAL  10/07   Mirena removed 05/10/2004  . PILONIDAL CYST EXCISION    . TUBAL LIGATION  2008  . VAGINAL DELIVERY     X 4  . VAGINAL HYSTERECTOMY N/A 07/07/2013   Procedure: HYSTERECTOMY VAGINAL;  Surgeon: Donnamae Jude, MD;  Location: [facility];  Service: Gynecology;  Laterality: N/A;    OB History    Gravida Para Term Preterm AB Living   4 4 4     4    SAB TAB Ectopic Multiple Live Births                   Home  Medications    Prior to Admission medications   Medication Sig Start Date End Date Taking? Authorizing Provider  albuterol (PROVENTIL HFA;VENTOLIN HFA) 108 (90 Base) MCG/ACT inhaler Inhale 2 puffs into the lungs every 4 (four) hours as needed for wheezing or shortness of breath. 08/14/15   Janne Napoleon, NP  Cholecalciferol (VITAMIN D PO) Take by mouth.    Historical Provider, MD  ferrous sulfate 325 (65 FE) MG tablet Take 325 mg by mouth daily with breakfast.    Historical Provider, MD  loratadine-pseudoephedrine (CLARITIN-D 24 HOUR) 10-240 MG 24 hr tablet Take 1 tablet by mouth daily. 02/20/16 03/06/16  Sherlene Shams, MD  midodrine (PROAMATINE) 5 MG tablet  take 1 tablet by mouth three times a day with meals 01/14/16   Arnoldo Morale, MD  propylthiouracil (PTU) 50 MG tablet take 1 tablet by mouth once daily 02/23/16   Renato Shin, MD  triamcinolone (NASACORT AQ) 55 MCG/ACT AERO nasal inhaler Place 2 sprays into the nose daily. 02/20/16   Sherlene Shams, MD    Family History Family History  Problem Relation Age of Onset  . Breast cancer Mother   . Hypertension Mother   . Diabetes Mother   . Hypertension Father   . Diabetes Father   . Breast cancer Maternal Aunt   . Ovarian cancer Maternal Aunt   . Hypertension Sister   . Hypertension Other   . Stomach cancer Paternal Grandmother     Social History Social History  Substance Use Topics  . Smoking status: Former Smoker    Quit date: 11/08/2005  . Smokeless tobacco: Never Used  . Alcohol use No     Allergies   Penicillins; Azithromycin; and Bacitracin-lidocaine [lidocaine-bacitracin]   Review of Systems Review of Systems  Constitutional: Negative for chills and fever.  Gastrointestinal:       No bowel incontinence  Genitourinary:       No urinary incontinence  Musculoskeletal: Positive for arthralgias, back pain and myalgias.  Neurological:       No saddle anesthesia  Psychiatric/Behavioral: The patient is nervous/anxious.      Physical Exam Updated Vital Signs BP 135/82 (BP Location: Right Arm)   Pulse 90   Temp 98.6 F (37 C) (Oral)   Resp 18   Ht 5\' 11"  (1.803 m)   Wt 102.1 kg   LMP 06/18/2013   SpO2 98%   BMI 31.38 kg/m   Physical Exam Physical Exam  Constitutional: Pt appears well-developed and well-nourished. No distress.  HENT:  Head: Normocephalic and atraumatic.  Mouth/Throat: Oropharynx is clear and moist. No oropharyngeal exudate.  Eyes: Conjunctivae are normal.  Neck: Normal range of motion. Neck supple.  No meningismus Cardiovascular: Normal rate, regular rhythm and intact distal pulses.   Pulmonary/Chest: Effort normal and breath sounds normal.  No respiratory distress. Pt has no wheezes.  Abdominal: Pt exhibits no distension Musculoskeletal:  Milder cervical paraspinal muscles tender to palpation, no bony CTLS spine tenderness, deformity, step-off, or crepitus Lymphadenopathy: Pt has no cervical adenopathy.  Neurological: Pt is alert and oriented Speech is clear and goal oriented, follows commands Normal 5/5 strength in upper and lower extremities bilaterally including dorsiflexion and plantar flexion, strong and equal grip strength Sensation intact Great toe extension intact Moves extremities without ataxia, coordination intact Ambulates with a walker No Clonus Skin: Skin is warm and dry. No rash noted. Pt is not diaphoretic. No erythema.  Psychiatric: Pt has a normal mood and affect. Behavior is normal.  patient is  anxious Nursing note and vitals reviewed.   ED Treatments / Results  Labs (all labs ordered are listed, but only abnormal results are displayed) Labs Reviewed - No data to display  EKG  EKG Interpretation None       Radiology No results found.  Procedures Procedures (including critical care time)  Medications Ordered in ED Medications  diazepam (VALIUM) tablet 2 mg (not administered)     Initial Impression / Assessment and Plan / ED Course  I have reviewed the triage vital signs and the nursing notes.  Pertinent labs & imaging results that were available during my care of the patient were reviewed by me and considered in my medical decision making (see chart for details).  Clinical Course   Patient with back pain.  No neurological deficits and normal neuro exam.  Patient is ambulatory.  No loss of bowel or bladder control.  Doubt cauda equina.  Denies fever,  doubt epidural abscess or other lesion. Recommend back exercises, stretching, RICE.  She is starting physical therapy next week. I think this will benefit her immensely. She is instructed to follow-up with her doctor or return here for new  or worsening symptoms.  Encouraged the patient that there could be a need for additional workup and/or imaging such as MRI, if the symptoms do not resolve. Patient advised that if the back pain does not resolve, or radiates, this could progress to more serious conditions and is encouraged to follow-up with PCP or orthopedics within 2 weeks.   I gave the patient a handout for anxiety/depression and outpatient resources.    Final Clinical Impressions(s) / ED Diagnoses   Final diagnoses:  Chronic back pain  Anxiety    New Prescriptions New Prescriptions   No medications on file     Montine Circle, PA-C 02/25/16 1431    Leo Grosser, MD 02/25/16 1827

## 2016-02-25 NOTE — ED Triage Notes (Signed)
Patient states "I can't work because of my back and it bothers me spiritually, physically, emotionally".   Patient states "I resigned my job cause I know I can't do it".   Patient states "I am having an anxiety attack.  I get nervous and panicky".  Patient states went to a doctor and was diagnosed with PTSD previously.   Patient states she no longer has medicine to help her.   States was taking diazepam previously.

## 2016-02-25 NOTE — ED Notes (Signed)
Gave pt [place] sandwich and apple juice per PA order

## 2016-02-25 NOTE — Discharge Planning (Signed)
EDCM reviewed discharging chart for possible CM needs.  No needs identified.    

## 2016-03-01 ENCOUNTER — Ambulatory Visit (INDEPENDENT_AMBULATORY_CARE_PROVIDER_SITE_OTHER): Payer: Medicaid Other | Admitting: Orthopaedic Surgery

## 2016-03-01 DIAGNOSIS — M545 Low back pain: Secondary | ICD-10-CM | POA: Diagnosis not present

## 2016-03-03 ENCOUNTER — Telehealth: Payer: Self-pay | Admitting: Internal Medicine

## 2016-03-03 DIAGNOSIS — I951 Orthostatic hypotension: Secondary | ICD-10-CM

## 2016-03-03 MED ORDER — MIDODRINE HCL 5 MG PO TABS: ORAL_TABLET | ORAL | 2 refills | Status: DC

## 2016-03-03 NOTE — Telephone Encounter (Signed)
Sent request in to [facility] to refill and approve an early fill due to loss of vial.

## 2016-03-03 NOTE — Telephone Encounter (Signed)
Pt lost her midodrine (PROAMATINE) 5 MG tablet Rx two days ago and is requesting another Rx be sent in to the [facility] on [address] Thank you

## 2016-03-09 ENCOUNTER — Ambulatory Visit: Payer: Medicaid Other | Attending: Internal Medicine | Admitting: Internal Medicine

## 2016-03-09 ENCOUNTER — Encounter: Payer: Self-pay | Admitting: Internal Medicine

## 2016-03-09 VITALS — BP 120/79 | HR 65 | Temp 97.9°F | Resp 18 | Ht 70.0 in | Wt 219.0 lb

## 2016-03-09 DIAGNOSIS — G8929 Other chronic pain: Secondary | ICD-10-CM | POA: Insufficient documentation

## 2016-03-09 DIAGNOSIS — Q760 Spina bifida occulta: Secondary | ICD-10-CM

## 2016-03-09 DIAGNOSIS — Z881 Allergy status to other antibiotic agents status: Secondary | ICD-10-CM | POA: Diagnosis not present

## 2016-03-09 DIAGNOSIS — Z7951 Long term (current) use of inhaled steroids: Secondary | ICD-10-CM | POA: Insufficient documentation

## 2016-03-09 DIAGNOSIS — J45909 Unspecified asthma, uncomplicated: Secondary | ICD-10-CM | POA: Insufficient documentation

## 2016-03-09 DIAGNOSIS — F411 Generalized anxiety disorder: Secondary | ICD-10-CM | POA: Insufficient documentation

## 2016-03-09 DIAGNOSIS — M545 Low back pain, unspecified: Secondary | ICD-10-CM

## 2016-03-09 DIAGNOSIS — M549 Dorsalgia, unspecified: Secondary | ICD-10-CM | POA: Diagnosis present

## 2016-03-09 DIAGNOSIS — Z880 Allergy status to penicillin: Secondary | ICD-10-CM | POA: Diagnosis not present

## 2016-03-09 DIAGNOSIS — Z79899 Other long term (current) drug therapy: Secondary | ICD-10-CM | POA: Diagnosis not present

## 2016-03-09 MED ORDER — ALBUTEROL SULFATE HFA 108 (90 BASE) MCG/ACT IN AERS: 2.0000 | INHALATION_SPRAY | RESPIRATORY_TRACT | 3 refills | Status: DC | PRN

## 2016-03-09 NOTE — Progress Notes (Signed)
Patient is here for Referral to PT and Neuro  Patient complains of lower back pain and right knee pain.  Patient has not taken medication today and patient has not eaten today.  Patient declined the flu vaccine today.

## 2016-03-09 NOTE — Progress Notes (Signed)
Monica Chase, is a 40 y.o. female  [id number]  [id number]  DOB - 07/04/1975  Chief Complaint  Patient presents with  . Referral       Subjective:   Monica Chase is a 40 y.o. female with history of chronic back pain, orthostatic hypotension, asthma, and hyperthyroidism here today for a follow up visit and for referral to Neurologist and Physical Therapist. She has seen orthopedist and was advised no surgical intervention necessary for her chronic low back pain. She continues to have significant pain in her lower back, she uses walker to ambulate. She is frustrated about her condition, does not want to just take medications but desires to know exactly what is going on and what is the solution. Patient has No headache, No chest pain, No abdominal pain - No Nausea, No new weakness tingling or numbness, No Cough - SOB.  Problem  Chronic Midline Low Back Pain Without Sciatica    ALLERGIES: Allergies  Allergen Reactions  . Penicillins Other (See Comments)    Reaction unknown  . Azithromycin Rash  . Bacitracin-Lidocaine [Lidocaine-Bacitracin] Rash    PAST MEDICAL HISTORY: Past Medical History:  Diagnosis Date  . ANXIETY 07/26/2006  . ASTHMA, INTERMITTENT 07/26/2006  . CONSTIPATION, CHRONIC 06/22/2010  . DEPRESSION, MAJOR, RECURRENT 07/26/2006  . Dizziness 11/09/2010  . Fibroid   . HYPERGLYCEMIA 09/28/2009  . HYPERTHYROIDISM 07/20/2009  . OBESITY 01/31/2008    MEDICATIONS AT HOME: Prior to Admission medications   Medication Sig Start Date End Date Taking? Authorizing Provider  albuterol (PROVENTIL HFA;VENTOLIN HFA) 108 (90 Base) MCG/ACT inhaler Inhale 2 puffs into the lungs every 4 (four) hours as needed for wheezing or shortness of breath. 03/09/16  Yes Tresa Garter, MD  Cholecalciferol (VITAMIN D PO) Take by mouth.   Yes Historical Provider, MD  ferrous sulfate 325 (65 FE) MG tablet Take 325 mg by mouth daily with breakfast.   Yes Historical Provider, MD    midodrine (PROAMATINE) 5 MG tablet take 1 tablet by mouth three times a day with meals 03/03/16  Yes Tresa Garter, MD  propylthiouracil (PTU) 50 MG tablet take 1 tablet by mouth once daily 02/23/16  Yes Renato Shin, MD  triamcinolone (NASACORT AQ) 55 MCG/ACT AERO nasal inhaler Place 2 sprays into the nose daily. 02/20/16  Yes Sherlene Shams, MD    Objective:   Vitals:   03/09/16 1002  BP: 120/79  Pulse: 65  Resp: 18  Temp: 97.9 F (36.6 C)  TempSrc: Oral  SpO2: 98%  Weight: 219 lb (99.3 kg)  Height: 5\' 10"  (1.778 m)   Exam General appearance : Awake, alert, not in any distress. Speech Clear. Not toxic looking HEENT: Atraumatic and Normocephalic, pupils equally reactive to light and accomodation Neck: Supple, no JVD. No cervical lymphadenopathy.  Chest: Good air entry bilaterally, no added sounds  CVS: S1 S2 regular, no murmurs.  Abdomen: Bowel sounds present, Non tender and not distended with no gaurding, rigidity or rebound. Extremities: B/L Lower Ext shows no edema, both legs are warm to touch Neurology: Awake alert, and oriented X 3, CN II-XII intact, Non focal Skin: No Rash  Data Review Lab Results  Component Value Date   HGBA1C 4.90 07/12/2015   HGBA1C 5.0 11/09/2010    Assessment & Plan   1. Chronic midline low back pain without sciatica  - Ambulatory referral to Physical Therapy - Ambulatory referral to Neurology  2. Spina bifida occulta  - Ambulatory referral to Physical Therapy - Ambulatory  referral to Neurology  - Patient have been counseled extensively about nutrition and exercise.   Return in about 3 months (around 06/09/2016) for Follow up Pain and comorbidities, Generalized Anxiety Disorder.  The patient was given clear instructions to go to ER or return to medical center if symptoms don't improve, worsen or new problems develop. The patient verbalized understanding. The patient was told to call to get lab results if they haven't heard anything  in the next week.   This note has been created with Surveyor, quantity. Any transcriptional errors are unintentional.    Angelica Chessman, MD, [place], Karilyn Cota, [place] and [place], [place]   03/09/2016, 10:47 AM

## 2016-03-09 NOTE — Patient Instructions (Signed)

## 2016-03-21 ENCOUNTER — Ambulatory Visit: Payer: Medicaid Other | Attending: Internal Medicine | Admitting: Physical Therapy

## 2016-03-21 DIAGNOSIS — R293 Abnormal posture: Secondary | ICD-10-CM | POA: Insufficient documentation

## 2016-03-21 DIAGNOSIS — M6281 Muscle weakness (generalized): Secondary | ICD-10-CM | POA: Diagnosis present

## 2016-03-21 DIAGNOSIS — R262 Difficulty in walking, not elsewhere classified: Secondary | ICD-10-CM | POA: Diagnosis present

## 2016-03-21 DIAGNOSIS — Q760 Spina bifida occulta: Secondary | ICD-10-CM | POA: Insufficient documentation

## 2016-03-21 DIAGNOSIS — G8929 Other chronic pain: Secondary | ICD-10-CM | POA: Diagnosis present

## 2016-03-21 DIAGNOSIS — M545 Low back pain, unspecified: Secondary | ICD-10-CM

## 2016-03-21 NOTE — Therapy (Signed)
[place], [place], [address] Phone: [phone]   Fax:  [phone]  Physical Therapy Evaluation/ Medicaid 1 x Evaluation  Patient Details  Name: Monica Chase MRN: [id number] Date of Birth: Oct 20, 1975 Referring Provider: Aida Puffer MD  Encounter Date: 03/21/2016      PT End of Session - 03/21/16 0833    Visit Number 1   Number of Visits 1   Date for PT Re-Evaluation 03/21/16   Authorization Type Medicaid 1x   PT Start Time 0815   PT Stop Time 0915   PT Time Calculation (min) 60 min   Activity Tolerance Patient tolerated treatment well   Behavior During Therapy [facility] for tasks assessed/performed      Past Medical History:  Diagnosis Date  . ANXIETY 07/26/2006  . ASTHMA, INTERMITTENT 07/26/2006  . CONSTIPATION, CHRONIC 06/22/2010  . DEPRESSION, MAJOR, RECURRENT 07/26/2006  . Dizziness 11/09/2010  . Fibroid   . HYPERGLYCEMIA 09/28/2009  . HYPERTHYROIDISM 07/20/2009  . OBESITY 01/31/2008    Past Surgical History:  Procedure Laterality Date  . ABDOMINAL HYSTERECTOMY    . BILATERAL SALPINGECTOMY Bilateral 07/07/2013   Procedure: BILATERAL SALPINGECTOMY;  Surgeon: Donnamae Jude, MD;  Location: [facility];  Service: Gynecology;  Laterality: Bilateral;  . IUD REMOVAL  10/07   Mirena removed 05/10/2004  . PILONIDAL CYST EXCISION    . TUBAL LIGATION  2008  . VAGINAL DELIVERY     X 4  . VAGINAL HYSTERECTOMY N/A 07/07/2013   Procedure: HYSTERECTOMY VAGINAL;  Surgeon: Donnamae Jude, MD;  Location: [facility];  Service: Gynecology;  Laterality: N/A;    There were no vitals filed for this visit.       Subjective Assessment - 03/21/16 0822    Subjective I have spina bifida occulta but I dont understand it at all.  It scares me.  I stay at home a lot because I dont have transportation.  I have to use my walker all the time because I am afraid to fall.  I haven't fallen in the last 6 months.  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   Limitations Sitting;Standing;Walking;House hold activities   How long can you sit comfortably? 15 minutes sitting on double cushions   How long can you stand comfortably? 5-10 min   How long can you walk comfortably? 5-10 min   Diagnostic tests MRI   Patient Stated Goals Goal get a home program to strengthen and help balance   Currently in Pain? Yes   Pain Location Back   Pain Orientation Right;Left;Lower  left worse than right   Pain Descriptors / Indicators Aching;Shooting;Spasm   Pain Onset More than a month ago   Pain Frequency Intermittent   Aggravating Factors  moving and twisting, doing housework   Pain Relieving Factors resting             [facility] PT Assessment - 03/21/16 0813      Assessment   Medical Diagnosis chronic midline LB pain and spina bifida occulta   Referring Provider Aida Puffer MD   Onset Date/Surgical Date 08/11/15   Hand Dominance Right   Prior Therapy none     Precautions   Precautions Fall   Precaution Comments uses a rollator at all times  rollator too short handle     Restrictions   Weight Bearing Restrictions No     Balance Screen   Has the patient fallen  in the past 6 months No   Has the patient had a decrease in activity level because of a fear of falling?  Yes  she does not want to exacerbate pain   Is the patient reluctant to leave their home because of a fear of falling?  Yes  no transportation     [place] residence   Living Arrangements Alone;Children   Type of Home Apartment   Home Access Stairs to enter   Entrance Stairs-Number of Steps 14  has to carry walker up steps   Entrance Stairs-Rails Cannot reach both  carrying walker     Prior Function   Level of Independence Independent     Cognition   Overall Cognitive Status Within Functional Limits for tasks assessed     Sensation   Light Touch Impaired by gross assessment    Additional Comments light touch decreased on the right greater than left     Posture/Postural Control   Posture/Postural Control Postural limitations   Postural Limitations Rounded Shoulders;Forward head;Anterior pelvic tilt   Posture Comments left anterior rotated, higher on left pelvis than right     AROM   Overall AROM  Deficits   Lumbar Flexion 30  ERP   Lumbar Extension 10  sharp pain at end range   Lumbar - Right Side Bend 20   end range pain on left QL   Lumbar - Left Side Bend 20  end rannge pain on right QL   Lumbar - Right Rotation 75 % rotation   Lumbar - Left Rotation 90 % rotation     Strength   Right Hip Flexion 4/5   Right Hip Extension 3-/5   Right Hip ABduction 3-/5   Left Hip Flexion 4-/5   Left Hip Extension 3-/5   Left Hip ABduction 4-/5   Right Knee Flexion 4-/5   Right Knee Extension 4/5   Left Knee Flexion 4/5   Left Knee Extension 4/5   Right Ankle Dorsiflexion 4+/5   Right Ankle Plantar Flexion 3-/5   Left Ankle Dorsiflexion 4+/5   Left Ankle Plantar Flexion 4-/5     Flexibility   Hamstrings left 60 tight and pain, 70 on right     Palpation   Palpation comment tenderness over right quadratus lumborum on right worse than left.       Ambulation/Gait   Ambulation/Gait Yes   Gait Pattern Step-to pattern   Pre-Gait Activities wt shifting with UE support                    [facility] Adult PT Treatment/Exercise - 03/21/16 0813      Self-Care   Self-Care Other Self-Care Comments;Posture   Other Self-Care Comments  sitting and standing posture and needing an extension for handle bars for walker due to flexing over rollator , pt made aware of normal and healthy posture in sitting and standing, proper shoe wear and type     Lumbar Exercises: Stretches   Active Hamstring Stretch 2 reps;30 seconds   Active Hamstring Stretch Limitations demo on left side only   Double Knee to Chest Stretch 5 reps   Lower Trunk Rotation 2 reps;30 seconds    Lower Trunk Rotation Limitations bil   Prone on Elbows Stretch 60 seconds;1 rep   Prone on Elbows Stretch Limitations eliminated any discomfort in low back   Press Ups 5 reps  modify and stand several times a day to extend decreasedflex   Quadruped  Mid Back Stretch 30 seconds;2 reps   Quadruped Mid Back Stretch Limitations forward and side to side    Piriformis Stretch 30 seconds;2 reps   Piriformis Stretch Limitations right side and sitting variations     Lumbar Exercises: Supine   Ab Set 5 reps   AB Set Limitations 10 sec hold   Clam 5 reps   Clam Limitations bil VC for breathing   Bent Knee Raise 5 reps   Bent Knee Raise Limitations bil VC for breathing   Bridge 10 reps   Bridge Limitations pt unable to complete AROM but attempts / weakness   Other Supine Lumbar Exercises prepilates    Other Supine Lumbar Exercises standing heel raises while holding onto counter  x 10   9 work towards 20     Lumbar Exercises: Sidelying   Other Sidelying Lumbar Exercises sidelying clamshell with green t band x 10                     PT Long Term Goals - 03/21/16 1331      PT LONG TERM GOAL #1   Title Pt is aware of community resources   Time 1   Period Days   Status Achieved     PT LONG TERM GOAL #2   Title Pt was given initial HEP for strengthening and balance   Time 1   Period Days   Status Achieved     PT LONG TERM GOAL #3   Title Pt was given education and handouts on posture/body mechanics and proper shoes   Time 1   Period Days   Status Achieved               Plan - 03/21/16 0929    Clinical Impression Statement 40 yo female on Medicaid enters clinic dependent on rollator which is too short for her. Pt with spina bifida occulta and low back pain without sciatica with left worse than right.  Pt is wearing rain boots and needs better and more sturdy foot wear.  Pt was explained her diagnosis due to not having a clear picture of spina bifida occulta which  has been reciently diagnosed in combination with mild impingement of lumbar nerves.  Pt with bilateral quadratus lumborum tightness left greater than right.  Pt presents with deficits in strength  right worse than left LE's but still able to stand and walk (See MMT).  Pt with marked hip ext/hip abd weakness.   Pt utilizes rollator walker at alltimes.  Pt would benefit from extensions on handles in order to stand more erect and discourage flexed and slumped posture.  Pt was given intial HEP and community resources to investigate since she decliined further treatment due to financial limtations at this time   Rehab Potential Good   PT Frequency One time visit   PT Duration Other (comment)  1 x   PT Treatment/Interventions Patient/family education;Therapeutic exercise;ADLs/Self Care Home Management;Gait training   PT Next Visit Plan DC  1 x Medicaid   Consulted and Agree with Plan of Care Patient      Patient will benefit from skilled therapeutic intervention in order to improve the following deficits and impairments:  Pain, Impaired sensation, Abnormal gait, Decreased balance, Decreased strength, Decreased range of motion, Difficulty walking, Postural dysfunction, Improper body mechanics, Hypomobility, Decreased knowledge of use of DME, Decreased mobility  Visit Diagnosis: Chronic midline low back pain without sciatica  Spina bifida occulta  Abnormal posture  Muscle weakness (  generalized)  Difficulty in walking, not elsewhere classified     Problem List Patient Active Problem List   Diagnosis Date Noted  . 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 Muscle cramps 01/20/2016  . Vitamin D deficiency 08/27/2015  . Chronic midline low back pain without sciatica 08/26/2015  . Orthostatic hypotension 02/16/2015  . Near syncope 02/05/2015  . Headache 02/05/2015  . Atypical chest pain 08/05/2014  . Herpes zoster 12/01/2013  . Benign paroxysmal positional vertigo 11/13/2013  . Hemorrhoid  04/16/2013  . Hypercholesterolemia 08/05/2012  . Dental caries 03/19/2012  . Dizziness 11/09/2010  . CONSTIPATION, CHRONIC 06/22/2010  . Hyperthyroidism 07/20/2009  . Obesity 01/31/2008  . Episodic mood disorder ([place]) 07/26/2006  . Anxiety state 07/26/2006  . ASTHMA, INTERMITTENT 07/26/2006   Voncille Lo, PT Exercise Expert for the Aging Adult  03/21/16 1:37 PM Phone: [phone] Fax: [phone]    Due to changes in the [facility] Policy for Rehab as of June 1st , 2014-- this patient does not have a qualifying diagnosis that is covered.  The patient is unable topay out of pocket expenses at this time, there fore will not be seen for treatment.. Pt was directed to  [employer] and financial aid and stated she did not want to pay out of pocket for continued treatment.   [place], [place], [address] Phone: [phone]   Fax:  [phone]  Name: Monica Chase MRN: [id number] Date of Birth: 07/02/1975

## 2016-03-21 NOTE — Patient Instructions (Addendum)
Stretching Double Knee to Chest (Flexion)   Gently pull both knees toward chest. Feel stretch in lower back or buttock area. Breathing deeply, Hold ___30_ seconds. Repeat __3__ times. Do _2___ sessions per day.  http://gt2.exer.us/227   Copyright  VHI. All rights reserved.  Piriformis (Supine)   Cross legs, right on top. Gently pull other knee toward chest until stretch is felt in buttock/hip of top leg. Hold _30___ seconds. Repeat ___3_ times per set.  Do __2__ sessions per day. You can also sit down and cross leg.  You can move up to this stretch  http://orth.exer.us/676   Copyright  VHI. All rights reserved.  Lower Trunk Rotation Stretch   Keeping back flat and feet together, rotate knees to left side. Hold ___30_ seconds. Repeat __3_ times per set. . Do __2__ sessions per day.  http://orth.exer.us/122   Copyright  VHI. All rights reserved.    Hamstring Step 2   Left foot relaxed, knee straight, other leg bent, foot flat. Raise straight leg further upward to maximal range. Hold _30__ seconds. Relax leg completely down. Repeat _2-3__ times.  Copyright  VHI. All rights reserved.  Side Twist, Kneeling   Kneel, buttocks on heels. Fold upper body forward from hips. Then reach to each side as far as possible, keeping chest low toward floor. Hold each position _30__ seconds. Repeat __3_ times per session. Do __2_ sessions per day.  Copyright  VHI. All rights reserved.     Elbow Prop (Extension)   Prop body up on elbows for __30-60__ seconds. Slowly lower it. Repeat __2-3__ times. Do _2___ sessions per day.  Extension   Lie face down, hands close to chest. Press trunk up, arching back. Keep neck long, shoulders down. Tighten buttocks to protect lower back. Hold _5___ seconds. Repeat __10__ times. Do _1-2___ sessions per day.  Backward Bend (Standing)   Arch backward to make hollow of back deeper. Hold _5___ seconds. Repeat __5__ times per set. Do _1___  sets per session. Do __3__ sessions per day.  Copyright  VHI. All rights reserved.   STRENGTHENING Exericises    Basic Abdominal strength  PELVIC TILT  Lie on back, legs bent. Exhale, tilting top of pelvis back, pubic bone up, to flatten lower back. Inhale, rolling pelvis opposite way, top forward, pubic bone down, arch in back. Repeat __10__ times. Do __2__ sessions per day. Copyright  VHI. All rights reserved.    Isometric Hold With Pelvic Floor (Hook-Lying)  Lie with hips and knees bent. Slowly inhale, and then exhale. Pull navel toward spine and tighten pelvic floor. Hold for __10_ seconds. Continue to breathe in and out during hold. Rest for _10__ seconds. Repeat __10_ times. Do __2-3_ times a day.   Knee Fold  Lie on back, legs bent, arms by sides. Exhale, lifting knee to chest. Inhale, returning. Keep abdominals flat, navel to spine. Repeat __10__ times, alternating legs. Do __2__ sessions per day.  Knee Drop  Keep pelvis stable. Without rotating hips, slowly drop knee to side, pause, return to center, bring knee across midline toward opposite hip. Feel obliques engaging. Repeat for ___10_ times each leg.   Copyright  VHI. All rights reserved.       HIP: Abduction / External Rotation (Band)   Place band around knees. Lie on side with hips and knees bent. Raise top knee up, squeezing glutes. Keep feet together. Hold _5__ seconds. Use __green______ band. _10__ reps per set, _3__ sets per day, __6-7_ days per week do right and  left side Bridge   Lie back, legs bent. Inhale, pressing hips up. Keeping ribs in, lengthen lower back. Exhale, rolling down along spine from top. Repeat _10 x 2___ times. Do __1-2__ sessions per day.   Bridging (Single Leg)   Lie on back with feet shoulder width apart and right leg straight. Lift hips toward the ceiling while keeping leg straight. Hold __3-5__ seconds. Repeat __2 x 10__ times. Do __1-2__ sessions per day.  Do right and  left  Heel Raises    Stand with support. Tighten pelvic floor and hold. With knees straight, raise heels off ground. Hold _3-5__ seconds.  Repeat _20__ times. Do __2_ times a day.  Copyright  VHI. All rights reserved.   Voncille Lo, PT Exercise Expert for the Aging Adult  03/21/16 9:11 AM Phone: [phone] Fax: [phone]

## 2016-03-24 ENCOUNTER — Other Ambulatory Visit: Payer: Self-pay | Admitting: Endocrinology

## 2016-04-13 ENCOUNTER — Telehealth: Payer: Self-pay | Admitting: Internal Medicine

## 2016-04-13 NOTE — Telephone Encounter (Signed)
Pt. Called that her PCP knows that she gets a lot of sinus infections and would like to know if her PCP would like to see her or can he call an Rx to [facility].

## 2016-04-14 ENCOUNTER — Encounter: Payer: Self-pay | Admitting: Neurology

## 2016-04-14 ENCOUNTER — Ambulatory Visit (INDEPENDENT_AMBULATORY_CARE_PROVIDER_SITE_OTHER): Payer: Medicaid Other | Admitting: Neurology

## 2016-04-14 VITALS — BP 120/66 | HR 75 | Ht 70.0 in | Wt 221.5 lb

## 2016-04-14 DIAGNOSIS — M545 Low back pain, unspecified: Secondary | ICD-10-CM

## 2016-04-14 DIAGNOSIS — G8929 Other chronic pain: Secondary | ICD-10-CM | POA: Diagnosis not present

## 2016-04-14 MED ORDER — DULOXETINE HCL 30 MG PO CPEP: 30.0000 mg | ORAL_CAPSULE | Freq: Two times a day (BID) | ORAL | 3 refills | Status: DC

## 2016-04-14 NOTE — Progress Notes (Signed)
Reason for visit: Low back pain  Referring physician: Dr. Linus Orn Monica Chase is a 40 y.o. female  History of present illness:  Monica Chase is a 40 year old right-handed white female with a history of onset of low back pain around March 2017. The patient denies any particular injury to her back. She was performing the job tasks at that time that require heavy lifting. The patient is no longer working, but she continues to have low back pain. 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 The patient has had emergency room visits on September 24 and September 29 for low back pain. The patient claims that she has been tried on a multitude of medications without benefit that include gabapentin, a nonsteroidal anti-inflammatory medication, Ultram, Robaxin, and Flexeril. The patient was sent for physical therapy, but because she has Medicaid, only one session was allowed and the patient was given instructions on how to do back exercises. The patient claims that she will have good days and bad days with the back pain, she is using a walker for ambulation at this time. She denies any pain down the legs, she denies any numbness or weakness of the extremities or any problems controlling the bowels or the bladder. When the back pain is severe, the pain may travel up the spine to the neck. She denies any pain down the arms. The patient is sent to this office for an evaluation.  Past Medical History:  Diagnosis Date  . ANXIETY 07/26/2006  . ASTHMA, INTERMITTENT 07/26/2006  . CONSTIPATION, CHRONIC 06/22/2010  . DEPRESSION, MAJOR, RECURRENT 07/26/2006  . Dizziness 11/09/2010  . Fibroid   . HYPERGLYCEMIA 09/28/2009  . HYPERTHYROIDISM 07/20/2009  . OBESITY 01/31/2008  . PTSD (post-traumatic stress disorder)     Past Surgical History:  Procedure Laterality Date  . BILATERAL SALPINGECTOMY Bilateral 07/07/2013   Procedure: BILATERAL SALPINGECTOMY;  Surgeon: Donnamae Jude, MD;  Location: [facility];  Service: Gynecology;  Laterality: Bilateral;  . IUD REMOVAL  10/07   Mirena removed 05/10/2004  . PILONIDAL CYST EXCISION    . TUBAL LIGATION  2008  . VAGINAL DELIVERY     X 4  . VAGINAL HYSTERECTOMY N/A 07/07/2013   Procedure: HYSTERECTOMY VAGINAL;  Surgeon: Donnamae Jude, MD;  Location: [facility];  Service: Gynecology;  Laterality: N/A;    Family History  Problem Relation Age of Onset  . Breast cancer Mother   . Hypertension Mother   . Diabetes Mother   . Hypertension Father   . Diabetes Father   . Breast cancer Maternal Aunt   . Ovarian cancer Maternal Aunt   . Hypertension Sister   . Hypertension Other   . Stomach cancer Paternal Grandmother   . Childhood respiratory disease Child     Social history:  reports that she quit smoking about 10 years ago. She has never used smokeless tobacco. She reports that she does not drink alcohol or use drugs.  Medications:  Prior to Admission medications   Medication Sig Start Date End Date Taking? Authorizing Provider  albuterol (PROVENTIL HFA;VENTOLIN HFA) 108 (90 Base) MCG/ACT inhaler Inhale 2 puffs into the lungs every 4 (four) hours as needed for wheezing or shortness of breath. 03/09/16  Yes Tresa Garter, MD  Cholecalciferol (VITAMIN D PO) Take by mouth.   Yes Historical Provider, MD  ferrous sulfate 325 (65 FE) MG tablet Take 325 mg by mouth  daily with breakfast.   Yes Historical Provider, MD  midodrine (PROAMATINE) 5 MG tablet take 1 tablet by mouth three times a day with meals 03/03/16  Yes Tresa Garter, MD  propylthiouracil (PTU) 50 MG tablet take 1 tablet by mouth once daily 03/24/16  Yes Renato Shin, MD  triamcinolone (NASACORT AQ) 55 MCG/ACT AERO nasal inhaler Place 2 sprays into the nose daily. 02/20/16  Yes Sherlene Shams, MD      Allergies  Allergen Reactions  . Penicillins Other (See Comments)    Reaction unknown  .  Azithromycin Rash  . Bacitracin-Lidocaine [Lidocaine-Bacitracin] Rash    ROS:  Out of a complete 14 system review of symptoms, the patient complains only of the following symptoms, and all other reviewed systems are negative.  Fevers, chills, fatigue Easy bruising Feeling hot, cold, increased thirst Joint pain, muscle cramps, aching muscles Constipation Skin rash Allergies, skin sensitivity, frequent infections Dizziness Decreased energy  Blood pressure 120/66, pulse 75, height 5\' 10"  (1.778 m), weight 221 lb 8 oz (100.5 kg), last menstrual period 06/18/2013.  Physical Exam  General: The patient is alert and cooperative at the time of the examination. The patient is moderately obese.  Eyes: Pupils are equal, round, and reactive to light. Discs are flat bilaterally.  Neck: The neck is supple, no carotid bruits are noted.  Respiratory: The respiratory examination is clear.  Cardiovascular: The cardiovascular examination reveals a regular rate and rhythm, no obvious murmurs or rubs are noted.  Neuromuscular: Range of movement of the low back is full. With palpation of the spine, the patient has extreme hypersensitivity to even light touch at the mid lumbar level medially. No significant pain is noted over the SI joints.  Skin: Extremities are without significant edema.  Neurologic Exam  Mental status: The patient is alert and oriented x 3 at the time of the examination. The patient has apparent normal recent and remote memory, with an apparently normal attention span and concentration ability.  Cranial nerves: Facial symmetry is present. There is good sensation of the face to pinprick and soft touch bilaterally. The strength of the facial muscles and the muscles to head turning and shoulder shrug are normal bilaterally. Speech is well enunciated, no aphasia or dysarthria is noted. Extraocular movements are full. Visual fields are full. The tongue is midline, and the patient has  symmetric elevation of the soft palate. No obvious hearing deficits are noted.  Motor: The motor testing reveals 5 over 5 strength of all 4 extremities. Good symmetric motor tone is noted throughout.  Sensory: Sensory testing is intact to pinprick, soft touch, vibration sensation, and position sense on all 4 extremities. No evidence of extinction is noted.  Coordination: Cerebellar testing reveals good finger-nose-finger and heel-to-shin bilaterally.  Gait and station: Gait is normal. The patient however has been using a walker for ambulation. Tandem gait is normal. Romberg is negative. No drift is seen.  Reflexes: Deep tendon reflexes are symmetric and normal bilaterally. Toes are downgoing bilaterally.   MRI lumbar 01/03/16:  IMPRESSION: 1. Degenerative disc disease causing mild right eccentric impingement at L5-S 1. 2. Spina bifida occulta is suspected at the T12 and L1 levels. 3. Low T1 and low T2 signal lesion eccentric to the left in the T12 vertebral body. Based on appearance I suspect this is much more likely to represent either a fibrous lesion or red marrow reconversion rather than osteoblastic metastatic lesion. If the patient has a history of malignancy, consider whole-body bone  scan  * MRI scan images were reviewed online. I agree with the written report.    Assessment/Plan:  1. Chronic low back pain  2. History of dizziness  The patient has had MRI evaluation of the low back that does not show any significant structural changes that would explain severe back pain. The patient appears to have fairly prominent pain displays with very light touch in the mid back, she has a normal clinical examination and good mobility of the low back, but she ambulates with a walker. The patient will continue the low back exercises, I will add Cymbalta to the regimen, we may place her on baclofen in the future. She will follow-up in 3 months.  Jill Alexanders MD 04/14/2016 11:48  AM  [facility] [address] [place], [place] [address]  Phone [phone] Fax [phone]

## 2016-04-14 NOTE — Telephone Encounter (Signed)
Thank you PCP

## 2016-04-14 NOTE — Telephone Encounter (Signed)
Your medical advice would be?

## 2016-04-14 NOTE — Telephone Encounter (Signed)
I had discussed with patient about her symptoms, advised symptomatic treatment for now, if symptoms persist by Monday 04/17/2016, to call back for prescription medicine.

## 2016-04-14 NOTE — Patient Instructions (Signed)
   With the Cymbalta begin 30 mg (one tablet) daily for one week, then take one tablet twice a day.

## 2016-04-18 ENCOUNTER — Ambulatory Visit: Payer: Medicaid Other | Attending: Internal Medicine | Admitting: Physician Assistant

## 2016-04-18 VITALS — BP 133/84 | HR 74 | Temp 98.2°F | Resp 20 | Ht 70.0 in | Wt 224.0 lb

## 2016-04-18 DIAGNOSIS — J322 Chronic ethmoidal sinusitis: Secondary | ICD-10-CM | POA: Diagnosis not present

## 2016-04-18 DIAGNOSIS — R0981 Nasal congestion: Secondary | ICD-10-CM | POA: Diagnosis present

## 2016-04-18 DIAGNOSIS — I951 Orthostatic hypotension: Secondary | ICD-10-CM

## 2016-04-18 DIAGNOSIS — R05 Cough: Secondary | ICD-10-CM | POA: Diagnosis present

## 2016-04-18 DIAGNOSIS — E0590 Thyrotoxicosis, unspecified without thyrotoxic crisis or storm: Secondary | ICD-10-CM | POA: Diagnosis not present

## 2016-04-18 MED ORDER — SULFAMETHOXAZOLE-TRIMETHOPRIM 800-160 MG PO TABS: 1.0000 | ORAL_TABLET | Freq: Two times a day (BID) | ORAL | 0 refills | Status: DC

## 2016-04-18 MED ORDER — FLUTICASONE PROPIONATE 50 MCG/ACT NA SUSP: 2.0000 | Freq: Every day | NASAL | 6 refills | Status: DC

## 2016-04-18 MED ORDER — MIDODRINE HCL 5 MG PO TABS: ORAL_TABLET | ORAL | 2 refills | Status: DC

## 2016-04-18 MED FILL — SULFAMETHOXAZOLE/TMP DS TAB: 800-160 | 10 days supply | Qty: 20 | Fill #0

## 2016-04-18 MED FILL — FLUTICASONE PROP 50 MCG SPR: 50 | 30 days supply | Qty: 16 | Fill #0

## 2016-04-18 NOTE — Progress Notes (Signed)
Sore throat, body soreness, fatigue, cough,  X 6 days. Eye drainage. Tried ibuprofen. No flu shot yet.

## 2016-04-18 NOTE — Progress Notes (Signed)
Monica Chase, is a 40 y.o. female  [id number]  [id number]  DOB - Oct 21, 1975  Subjective:  Chief Complaint and HPI: Monica Chase is a 40 y.o. female here today for 6 day h/o sinus congestion and pressure X 6 days.  She c/o ear and sinus pressure and cough with post nasal drip.  Mucus has been clear/white thus far.   She is not getting any better.  No f/c.  No vomiting.  + some nausea.  + body aches.  She is running low on her midodrine-she thinks she only has enough to last through today and requests a RF.  She is stable on PTU for hyperthyroid.     ROS:   Constitutional:  No f/c, No night sweats, No unexplained weight loss. EENT:  No vision changes, No blurry vision, No hearing changes. No mouth, throat, or ear problems.  Respiratory: + cough, No SOB, =some wheezing Cardiac: No CP, no palpitations GI:  No abd pain, No V/D. GU: No Urinary s/sx Musculoskeletal: +body aches Neuro: No headache, no dizziness, no motor weakness.  Skin: No rash Endocrine:  No polydipsia. No polyuria.  Psych: Denies SI/HI  No problems updated.  ALLERGIES: Allergies  Allergen Reactions  . Penicillins Other (See Comments)    Reaction unknown  . Azithromycin Rash  . Bacitracin-Lidocaine [Lidocaine-Bacitracin] Rash    PAST MEDICAL HISTORY: Past Medical History:  Diagnosis Date  . ANXIETY 07/26/2006  . ASTHMA, INTERMITTENT 07/26/2006  . CONSTIPATION, CHRONIC 06/22/2010  . DEPRESSION, MAJOR, RECURRENT 07/26/2006  . Dizziness 11/09/2010  . Fibroid   . HYPERGLYCEMIA 09/28/2009  . HYPERTHYROIDISM 07/20/2009  . OBESITY 01/31/2008  . PTSD (post-traumatic stress disorder)     MEDICATIONS AT HOME: Prior to Admission medications   Medication Sig Start Date End Date Taking? Authorizing Provider  albuterol (PROVENTIL HFA;VENTOLIN HFA) 108 (90 Base) MCG/ACT inhaler Inhale 2 puffs into the lungs every 4 (four) hours as needed for wheezing or shortness of breath. 03/09/16  Yes Tresa Garter, MD  Cholecalciferol (VITAMIN D PO) Take by mouth.   Yes Historical Provider, MD  DULoxetine (CYMBALTA) 30 MG capsule Take 1 capsule (30 mg total) by mouth 2 (two) times daily. 04/14/16  Yes Kathrynn Ducking, MD  ferrous sulfate 325 (65 FE) MG tablet Take 325 mg by mouth daily with breakfast.   Yes Historical Provider, MD  midodrine (PROAMATINE) 5 MG tablet take 1 tablet by mouth three times a day with meals 04/18/16  Yes Argentina Donovan, PA-C  propylthiouracil (PTU) 50 MG tablet take 1 tablet by mouth once daily 03/24/16  Yes Renato Shin, MD  fluticasone (FLONASE) 50 MCG/ACT nasal spray Place 2 sprays into both nostrils daily. 04/18/16   Argentina Donovan, PA-C  sulfamethoxazole-trimethoprim (BACTRIM DS,SEPTRA DS) 800-160 MG tablet Take 1 tablet by mouth 2 (two) times daily. 04/18/16   Argentina Donovan, PA-C  triamcinolone (NASACORT AQ) 55 MCG/ACT AERO nasal inhaler Place 2 sprays into the nose daily. Patient not taking: Reported on 04/18/2016 02/20/16   Sherlene Shams, MD     Objective:  EXAM:   Vitals:   04/18/16 0845  BP: 133/84  Pulse: 74  Resp: 20  Temp: 98.2 F (36.8 C)  TempSrc: Oral  SpO2: 100%  Weight: 224 lb (101.6 kg)  Height: 5\' 10"  (1.778 m)    Chase appearance : A&OX3. NAD. Non-toxic-appearing.  Ambulates using a rolling walker.  Pleasant, cooperative HEENT: Atraumatic and Normocephalic.  PERRLA. EOM intact.  TM clear B.  Turbinates enlarged and congested. +sinus TTP over maxillary area. Mouth-MMM, post pharynx WNL w/o erythema, No PND. Neck: supple, no JVD. No cervical lymphadenopathy. No thyromegaly Chest/Lungs:  Breathing-non-labored, Good air entry bilaterally, breath sounds normal without rales, rhonchi, or wheezing  CVS: S1 S2 regular, no murmurs, gallops, rubs Extremities: Bilateral Lower Ext shows no edema, both legs are warm to touch with = pulse throughout Neurology:  CN II-XII grossly intact, Non focal.   Psych:  TP linear. J/I WNL. Normal  speech. Appropriate eye contact and affect.  Skin:  No Rash  Data Review Lab Results  Component Value Date   HGBA1C 4.90 07/12/2015   HGBA1C 5.0 11/09/2010     Assessment & Plan   1. Ethmoid sinusitis, unspecified chronicity This is likely viral URI, but if it worsens or progresses over the next 48 hours, she will start antibiotics- - sulfamethoxazole-trimethoprim (BACTRIM DS,SEPTRA DS) 800-160 MG tablet; Take 1 tablet by mouth 2 (two) times daily.  Dispense: 20 tablet; Refill: 0 - fluticasone (FLONASE) 50 MCG/ACT nasal spray; Place 2 sprays into both nostrils daily.  Dispense: 16 g; Refill: 6  2. Orthostatic hypotension Normotensive today.  Continue- - midodrine (PROAMATINE) 5 MG tablet; take 1 tablet by mouth three times a day with meals  Dispense: 90 tablet; Refill: 2  3. Hyperthyroidism Labs stable.  On PTU.  No new s/sx  Patient have been counseled extensively about nutrition and exercise  Return in about 6 weeks (around 05/30/2016) for check up with Dr. Doreene Burke.  The patient was given clear instructions to go to ER or return to medical center if symptoms don't improve, worsen or new problems develop. The patient verbalized understanding. The patient was told to call to get lab results if they haven't heard anything in the next week.     Freeman Caldron, PA-C [facility] and [facility] [place], [place]   04/18/2016, 9:12 AMPatient ID: Monica Chase, female   DOB: 1975/11/15, 40 y.o.   MRN: [id number]

## 2016-04-18 NOTE — Patient Instructions (Signed)
mucinex DM, sudafed, and ibuprofen

## 2016-05-01 ENCOUNTER — Other Ambulatory Visit: Payer: Self-pay | Admitting: Internal Medicine

## 2016-05-01 MED ORDER — PROPYLTHIOURACIL 50 MG PO TABS: 50.0000 mg | ORAL_TABLET | Freq: Every day | ORAL | 3 refills | Status: DC

## 2016-06-02 ENCOUNTER — Telehealth: Payer: Self-pay | Admitting: Internal Medicine

## 2016-06-02 NOTE — Telephone Encounter (Signed)
Called pt and to schedule an appointment with her PCP. LVM to return call and confirm appt. For 06/07/16

## 2016-06-07 ENCOUNTER — Ambulatory Visit: Payer: Medicaid Other | Admitting: Internal Medicine

## 2016-06-21 ENCOUNTER — Ambulatory Visit: Payer: Medicaid Other | Admitting: Internal Medicine

## 2016-07-04 ENCOUNTER — Telehealth: Payer: Self-pay | Admitting: Internal Medicine

## 2016-07-04 NOTE — Telephone Encounter (Signed)
Patient called the office to speak with PCP regarding her referral to the Ear, Nose and Throat specliast. Pt has yet to receive a call from them for an appointment. Pt continues to have a sinus infection. Please follow up.  Thank you.

## 2016-07-04 NOTE — Telephone Encounter (Signed)
The phone number provided is a text app number and MA was unable to leave a voice message.  !!!Patient does not have a referral to the ENT. Patient saw Monica Chase in November and was prescribed antibiotics if the symptoms did not subside after a day or two!!!

## 2016-07-07 ENCOUNTER — Ambulatory Visit: Payer: Medicaid Other | Attending: Physician Assistant | Admitting: Physician Assistant

## 2016-07-07 ENCOUNTER — Encounter: Payer: Self-pay | Admitting: Physician Assistant

## 2016-07-07 VITALS — BP 106/70 | HR 71 | Temp 98.4°F | Resp 16 | Wt 213.8 lb

## 2016-07-07 DIAGNOSIS — F329 Major depressive disorder, single episode, unspecified: Secondary | ICD-10-CM | POA: Diagnosis not present

## 2016-07-07 DIAGNOSIS — J45909 Unspecified asthma, uncomplicated: Secondary | ICD-10-CM | POA: Insufficient documentation

## 2016-07-07 DIAGNOSIS — R739 Hyperglycemia, unspecified: Secondary | ICD-10-CM | POA: Diagnosis not present

## 2016-07-07 DIAGNOSIS — E669 Obesity, unspecified: Secondary | ICD-10-CM | POA: Diagnosis not present

## 2016-07-07 DIAGNOSIS — E0590 Thyrotoxicosis, unspecified without thyrotoxic crisis or storm: Secondary | ICD-10-CM | POA: Insufficient documentation

## 2016-07-07 DIAGNOSIS — F4310 Post-traumatic stress disorder, unspecified: Secondary | ICD-10-CM | POA: Diagnosis not present

## 2016-07-07 DIAGNOSIS — J0100 Acute maxillary sinusitis, unspecified: Secondary | ICD-10-CM | POA: Diagnosis not present

## 2016-07-07 DIAGNOSIS — J329 Chronic sinusitis, unspecified: Secondary | ICD-10-CM | POA: Insufficient documentation

## 2016-07-07 DIAGNOSIS — R42 Dizziness and giddiness: Secondary | ICD-10-CM | POA: Diagnosis not present

## 2016-07-07 MED ORDER — GUAIFENESIN ER 1200 MG PO TB12: 1.0000 | ORAL_TABLET | Freq: Two times a day (BID) | ORAL | 0 refills | Status: DC

## 2016-07-07 MED ORDER — DOXYCYCLINE HYCLATE 100 MG PO TABS: 100.0000 mg | ORAL_TABLET | Freq: Two times a day (BID) | ORAL | 0 refills | Status: DC

## 2016-07-07 MED ORDER — SALINE SPRAY 0.65 % NA SOLN: 1.0000 | NASAL | 0 refills | Status: AC | PRN

## 2016-07-07 MED ORDER — PHENYLEPHRINE HCL 1 % NA SOLN: 1.0000 [drp] | Freq: Four times a day (QID) | NASAL | 0 refills | Status: DC | PRN

## 2016-07-07 MED ORDER — GUAIFENESIN ER 1200 MG PO TB12: 1.0000 | ORAL_TABLET | Freq: Two times a day (BID) | ORAL | 0 refills | Status: AC

## 2016-07-07 MED ORDER — SALINE SPRAY 0.65 % NA SOLN: 1.0000 | NASAL | 0 refills | Status: DC | PRN

## 2016-07-07 NOTE — Progress Notes (Signed)
Subjective:  Patient ID: Monica Chase, female    DOB: 1976-05-24  Age: 41 y.o. MRN: [id number]  CC: Sinus infection  HPI Monica Chase is a 41 y.o. female with a PMH of   Past Medical History:  Diagnosis Date  . ANXIETY 07/26/2006  . ASTHMA, INTERMITTENT 07/26/2006  . CONSTIPATION, CHRONIC 06/22/2010  . DEPRESSION, MAJOR, RECURRENT 07/26/2006  . Dizziness 11/09/2010  . Fibroid   . HYPERGLYCEMIA 09/28/2009  . HYPERTHYROIDISM 07/20/2009  . OBESITY 01/31/2008  . PTSD (post-traumatic stress disorder)     that presents with greater than 7 days of sinus congestion and maxillary sinus pain. Associated pressure of the eye bilaterally. Also with chills, mild cough, and mild headache. Has used Nasacort with no relief. Denies chest pain, SOB, wheezing, abdominal pain, fever, nausea, vomiting, rash, or GI/GU sxs.    Outpatient Medications Prior to Visit  Medication Sig Dispense Refill  . albuterol (PROVENTIL HFA;VENTOLIN HFA) 108 (90 Base) MCG/ACT inhaler Inhale 2 puffs into the lungs every 4 (four) hours as needed for wheezing or shortness of breath. 1 Inhaler 3  . Cholecalciferol (VITAMIN D PO) Take by mouth.    . ferrous sulfate 325 (65 FE) MG tablet Take 325 mg by mouth daily with breakfast.    . midodrine (PROAMATINE) 5 MG tablet take 1 tablet by mouth three times a day with meals 90 tablet 2  . propylthiouracil (PTU) 50 MG tablet Take 1 tablet (50 mg total) by mouth daily. 90 tablet 3  . DULoxetine (CYMBALTA) 30 MG capsule Take 1 capsule (30 mg total) by mouth 2 (two) times daily. (Patient not taking: Reported on 07/07/2016) 60 capsule 3  . fluticasone (FLONASE) 50 MCG/ACT nasal spray Place 2 sprays into both nostrils daily. (Patient not taking: Reported on 07/07/2016) 16 g 6  . sulfamethoxazole-trimethoprim (BACTRIM DS,SEPTRA DS) 800-160 MG tablet Take 1 tablet by mouth 2 (two) times daily. (Patient not taking: Reported on 07/07/2016) 20 tablet 0  . triamcinolone (NASACORT AQ) 55  MCG/ACT AERO nasal inhaler Place 2 sprays into the nose daily. (Patient not taking: Reported on 04/18/2016) 1 Inhaler 0   No facility-administered medications prior to visit.      ROS Review of Systems  Constitutional: Positive for chills. Negative for fever and malaise/fatigue.  HENT: Positive for congestion and sinus pain. Negative for ear pain and sore throat.   Eyes:       Bilateral eye pressure but not pain  Respiratory: Positive for cough (occasional). Negative for shortness of breath.   Cardiovascular: Negative for chest pain.  Gastrointestinal: Negative for abdominal pain and nausea.  Musculoskeletal: Positive for neck pain (mild, attributed to coughing). Negative for back pain.  Skin: Negative for rash.  Neurological: Positive for headaches (mild, frontal).    Objective:  BP 106/70 (BP Location: Right Arm, Patient Position: Sitting, Cuff Size: Large)   Pulse 71   Temp 98.4 F (36.9 C)   Resp 16   Wt 213 lb 12.8 oz (97 kg)   LMP 06/18/2013   SpO2 99%   BMI 30.68 kg/m   BP/Weight 07/07/2016 04/18/2016 [id number]  Systolic BP [id number] [id number]  Diastolic BP 70 84 66  Wt. (Lbs) 213.8 224 221.5  BMI 30.68 32.14 31.78     Physical Exam  Constitutional: She is oriented to person, place, and time.  NAD, overweight, polite, does not appear ill or toxic  HENT:  Head: Normocephalic and atraumatic.  TMs normal bilaterally, turbinates hypertrophic and erythematous, oropharynx with  postnasal drip and no exudates  Eyes: Right eye exhibits no discharge. Left eye exhibits no discharge. No scleral icterus.  Neck: Normal range of motion.  Cardiovascular: Normal rate, regular rhythm and normal heart sounds.   Pulmonary/Chest: Effort normal and breath sounds normal.  Musculoskeletal: She exhibits no edema.  Lymphadenopathy:    She has cervical adenopathy (bilateral anterior cervical lymphadenopathy).  Neurological: She is alert and oriented to person, place, and time.  Skin: Skin is  warm and dry. No rash noted. No erythema.  Psychiatric: She has a normal mood and affect. Her behavior is normal.     Assessment & Plan:   1. Acute non-recurrent maxillary sinusitis - Advised on benefits of Umcka use. - Pt to call or go to ED in case of severe symptoms. - doxycycline (VIBRA-TABS) 100 MG tablet; Take 1 tablet (100 mg total) by mouth 2 (two) times daily.  Dispense: 20 tablet; Refill: 0 - Guaifenesin (MUCINEX MAXIMUM STRENGTH) 1200 MG TB12; Take 1 tablet (1,200 mg total) by mouth 2 (two) times daily.  Dispense: 10 tablet; Refill: 0 - sodium chloride (OCEAN) 0.65 % SOLN nasal spray; Place 1 spray into both nostrils as needed for congestion.  Dispense: 1 Bottle; Refill: 0 - phenylephrine (NEO-SYNEPHRINE) 1 % nasal spray; Place 1 drop into both nostrils every 6 (six) hours as needed for congestion (Do not use more than 5 days).  Dispense: 30 mL; Refill: 0   Meds ordered this encounter  Medications  . doxycycline (VIBRA-TABS) 100 MG tablet    Sig: Take 1 tablet (100 mg total) by mouth 2 (two) times daily.    Dispense:  20 tablet    Refill:  0    Order Specific Question:   Supervising Provider    Answer:   Tresa Garter [id number]  . Guaifenesin (MUCINEX MAXIMUM STRENGTH) 1200 MG TB12    Sig: Take 1 tablet (1,200 mg total) by mouth 2 (two) times daily.    Dispense:  10 tablet    Refill:  0    Order Specific Question:   Supervising Provider    Answer:   Tresa Garter [id number]  . sodium chloride (OCEAN) 0.65 % SOLN nasal spray    Sig: Place 1 spray into both nostrils as needed for congestion.    Dispense:  1 Bottle    Refill:  0    Order Specific Question:   Supervising Provider    Answer:   Tresa Garter [id number]  . phenylephrine (NEO-SYNEPHRINE) 1 % nasal spray    Sig: Place 1 drop into both nostrils every 6 (six) hours as needed for congestion (Do not use more than 5 days).    Dispense:  30 mL    Refill:  0    Order Specific Question:    Supervising Provider    Answer:   Tresa Garter [id number]    Follow-up: as needed.  Clent Demark PA

## 2016-07-07 NOTE — Patient Instructions (Addendum)
You may want to try Umcka for a future occurrence of sinusitis or bronchitis. Monica Chase is found online and I would recommend to buy the dropper form so that it may be mixed with Echinacea tea. However, if sinusitis or bronchitis symptoms persist, I would recommend to see a healthcare provider. Please go to the ED or call here if your current symptoms progress or if you develop any other worrisome symptoms.  Sinusitis, Adult Sinusitis is soreness and inflammation of your sinuses. Sinuses are hollow spaces in the bones around your face. Your sinuses are located:  Around your eyes.  In the middle of your forehead.  Behind your nose.  In your cheekbones. Your sinuses and nasal passages are lined with a stringy fluid (mucus). Mucus normally drains out of your sinuses. When your nasal tissues become inflamed or swollen, the mucus can become trapped or blocked so air cannot flow through your sinuses. This allows bacteria, viruses, and funguses to grow, which leads to infection. Sinusitis can develop quickly and last for 7?10 days (acute) or for more than 12 weeks (chronic). Sinusitis often develops after a cold. What are the causes? This condition is caused by anything that creates swelling in the sinuses or stops mucus from draining, including:  Allergies.  Asthma.  Bacterial or viral infection.  Abnormally shaped bones between the nasal passages.  Nasal growths that contain mucus (nasal polyps).  Narrow sinus openings.  Pollutants, such as chemicals or irritants in the air.  A foreign object stuck in the nose.  A fungal infection. This is rare. What increases the risk? The following factors may make you more likely to develop this condition:  Having allergies or asthma.  Having had a recent cold or respiratory tract infection.  Having structural deformities or blockages in your nose or sinuses.  Having a weak immune system.  Doing a lot of swimming or diving.  Overusing nasal  sprays.  Smoking. What are the signs or symptoms? The main symptoms of this condition are pain and a feeling of pressure around the affected sinuses. Other symptoms include:  Upper toothache.  Earache.  Headache.  Bad breath.  Decreased sense of smell and taste.  A cough that may get worse at night.  Fatigue.  Fever.  Thick drainage from your nose. The drainage is often green and it may contain pus (purulent).  Stuffy nose or congestion.  Postnasal drip. This is when extra mucus collects in the throat or back of the nose.  Swelling and warmth over the affected sinuses.  Sore throat.  Sensitivity to light. How is this diagnosed? This condition is diagnosed based on symptoms, a medical history, and a physical exam. To find out if your condition is acute or chronic, your health care provider may:  Look in your nose for signs of nasal polyps.  Tap over the affected sinus to check for signs of infection.  View the inside of your sinuses using an imaging device that has a light attached (endoscope). If your health care provider suspects that you have chronic sinusitis, you may also:  Be tested for allergies.  Have a sample of mucus taken from your nose (nasal culture) and checked for bacteria.  Have a mucus sample examined to see if your sinusitis is related to an allergy. If your sinusitis does not respond to treatment and it lasts longer than 8 weeks, you may have an MRI or CT scan to check your sinuses. These scans also help to determine how severe your  infection is. In rare cases, a bone biopsy may be done to rule out more serious types of fungal sinus disease. How is this treated? Treatment for sinusitis depends on the cause and whether your condition is chronic or acute. If a virus is causing your sinusitis, your symptoms will go away on their own within 10 days. You may be given medicines to relieve your symptoms, including:  Topical nasal decongestants. They  shrink swollen nasal passages and let mucus drain from your sinuses.  Antihistamines. These drugs block inflammation that is triggered by allergies. This can help to ease swelling in your nose and sinuses.  Topical nasal corticosteroids. These are nasal sprays that ease inflammation and swelling in your nose and sinuses.  Nasal saline washes. These rinses can help to get rid of thick mucus in your nose. If your condition is caused by bacteria, you will be given an antibiotic medicine. If your condition is caused by a fungus, you will be given an antifungal medicine. Surgery may be needed to correct underlying conditions, such as narrow nasal passages. Surgery may also be needed to remove polyps. Follow these instructions at home: Medicines  Take, use, or apply over-the-counter and prescription medicines only as told by your health care provider. These may include nasal sprays.  If you were prescribed an antibiotic medicine, take it as told by your health care provider. Do not stop taking the antibiotic even if you start to feel better. Hydrate and Humidify  Drink enough water to keep your urine clear or pale yellow. Staying hydrated will help to thin your mucus.  Use a cool mist humidifier to keep the humidity level in your home above 50%.  Inhale steam for 10-15 minutes, 3-4 times a day or as told by your health care provider. You can do this in the bathroom while a hot shower is running.  Limit your exposure to cool or dry air. Rest  Rest as much as possible.  Sleep with your head raised (elevated).  Make sure to get enough sleep each night. General instructions  Apply a warm, moist washcloth to your face 3-4 times a day or as told by your health care provider. This will help with discomfort.  Wash your hands often with soap and water to reduce your exposure to viruses and other germs. If soap and water are not available, use hand sanitizer.  Do not smoke. Avoid being around  people who are smoking (secondhand smoke).  Keep all follow-up visits as told by your health care provider. This is important. Contact a health care provider if:  You have a fever.  Your symptoms get worse.  Your symptoms do not improve within 10 days. Get help right away if:  You have a severe headache.  You have persistent vomiting.  You have pain or swelling around your face or eyes.  You have vision problems.  You develop confusion.  Your neck is stiff.  You have trouble breathing. This information is not intended to replace advice given to you by your health care provider. Make sure you discuss any questions you have with your health care provider. Document Released: 05/15/2005 Document Revised: 01/09/2016 Document Reviewed: 03/10/2015 Elsevier Interactive Patient Education  2017 [employer].

## 2016-07-07 NOTE — Progress Notes (Signed)
Pharmacy Inhaler Education and Assessment Session  Subjective: Monica Chase is a 41 y.o. female who reports to the [facility] today for asthma education and medication review. The last appointment with their PCP, Dr. Domenica Fail, was on 07/07/2016. She is in good spirits and presents without assistance. Monica Chase has long-standing asthma. Today, their ACT score is 22, which represents adequate control of their asthma.       The patient is on the following medications for asthma:  Albuterol 2 puffs q4h prn for SOB    Currently, the patient states that they are taking their asthma medications as prescribed. Of note, the patient is not smoking.    Assessment:  Does the patient feel that his/her medications are working for him/her? Yes.     Has the patient been experiencing any side effects to the medications prescribed?  Yes, patient reports nervousness and jitters when she takes her albuterol.    Does the patient have an action plan for worsening symptoms?  No, provided asthma action plan to patient.     Does the patient have any problems obtaining medications due to transportation or finances?   No, patient reports previous issues paying for copay, but has recently found a pharmacy that waives it.     Understanding of regimen: excellent  Understanding of indications: excellent  Potential of compliance: excellent     Plan:  Education: We discussed what to do if typical albuterol dose does not work to improve SOB. She now has an asthma action plan where she will take albuterol 4 puffs every 20 min for 1 hour if an exacerbation occurs. We also went over albuterol technique.    Recommendations:   The following issues were identified during this visit:  - lack of asthma action plan    The patient was provided with an asthma action plan on their AVS today.   Time spent with patient: 20 minutes    Soham, Student-PharmD

## 2016-07-10 ENCOUNTER — Ambulatory Visit: Payer: Medicaid Other | Admitting: Adult Health

## 2016-07-11 ENCOUNTER — Encounter: Payer: Self-pay | Admitting: Adult Health

## 2016-07-21 ENCOUNTER — Telehealth: Payer: Self-pay | Admitting: Internal Medicine

## 2016-07-21 NOTE — Telephone Encounter (Signed)
Do you approve this request?

## 2016-07-21 NOTE — Telephone Encounter (Signed)
Patient calling requesting a letter from PCP stating that she is unable to work due to her ongoing back issues and dependability of a walker.  Pt states June this year will be a year she has been unable to work  Pt needs letter for a child support court case that will take place on March 12th. Pt requesting letter to be faxed to Steva Ready, child support enforcement, at [phone] sometime next week    Pt also requesting a referral to the ENT specialist for her sinus'

## 2016-07-24 ENCOUNTER — Ambulatory Visit: Payer: BC Managed Care – PPO | Admitting: Endocrinology

## 2016-07-24 ENCOUNTER — Telehealth: Payer: Self-pay | Admitting: Endocrinology

## 2016-07-24 DIAGNOSIS — Z0289 Encounter for other administrative examinations: Secondary | ICD-10-CM

## 2016-07-24 NOTE — Telephone Encounter (Signed)
Follow up advised. Contact patient and schedule visit in 4 weeks 

## 2016-07-24 NOTE — Telephone Encounter (Signed)
Patient no showed today's appt. Please advise on how to follow up. °A. No follow up necessary. °B. Follow up urgent. Contact patient immediately. °C. Follow up necessary. Contact patient and schedule visit in ___ days. °D. Follow up advised. Contact patient and schedule visit in ____weeks. ° °

## 2016-07-25 ENCOUNTER — Encounter: Payer: Self-pay | Admitting: *Deleted

## 2016-07-25 NOTE — Telephone Encounter (Signed)
Yes please

## 2016-07-25 NOTE — Telephone Encounter (Signed)
Thank you Nubia 

## 2016-07-25 NOTE — Telephone Encounter (Signed)
Letter has been mailed to the above named party. Patient is aware also. Thank you.

## 2016-08-07 ENCOUNTER — Telehealth: Payer: Self-pay | Admitting: Internal Medicine

## 2016-08-07 NOTE — Telephone Encounter (Signed)
Pt called requesting refill of porpylthioracil and midodrine to be filled at the [facility] on [facility]. Asks for nurse or Dr Doreene Burke to call in a prescription for her sinus infection as well as [place]. States that she was given a med by Dr Altamease Oiler and it gave her a yeast infection. Would like a different antibiotic. Please f/u.

## 2016-08-07 NOTE — Telephone Encounter (Signed)
Do you approve this request?

## 2016-08-14 NOTE — Telephone Encounter (Signed)
Patient called office to speak with nurse regarding the status of her medication. Please follow up.  Thank you.

## 2016-08-14 NOTE — Telephone Encounter (Signed)
Thank you :)

## 2016-08-14 NOTE — Telephone Encounter (Signed)
Patient no longer needs refills... Pt. Had refills at [facility].

## 2016-08-18 ENCOUNTER — Ambulatory Visit: Payer: Medicaid Other

## 2016-08-23 ENCOUNTER — Encounter: Payer: Self-pay | Admitting: Internal Medicine

## 2016-08-23 ENCOUNTER — Ambulatory Visit: Payer: Medicaid Other | Attending: Internal Medicine | Admitting: Internal Medicine

## 2016-08-23 VITALS — BP 116/78 | HR 64 | Temp 98.7°F | Resp 18 | Ht 71.0 in | Wt 213.6 lb

## 2016-08-23 DIAGNOSIS — F329 Major depressive disorder, single episode, unspecified: Secondary | ICD-10-CM | POA: Insufficient documentation

## 2016-08-23 DIAGNOSIS — F4310 Post-traumatic stress disorder, unspecified: Secondary | ICD-10-CM | POA: Diagnosis not present

## 2016-08-23 DIAGNOSIS — M549 Dorsalgia, unspecified: Secondary | ICD-10-CM | POA: Insufficient documentation

## 2016-08-23 DIAGNOSIS — J0101 Acute recurrent maxillary sinusitis: Secondary | ICD-10-CM | POA: Diagnosis not present

## 2016-08-23 DIAGNOSIS — E0590 Thyrotoxicosis, unspecified without thyrotoxic crisis or storm: Secondary | ICD-10-CM | POA: Insufficient documentation

## 2016-08-23 DIAGNOSIS — Z880 Allergy status to penicillin: Secondary | ICD-10-CM | POA: Insufficient documentation

## 2016-08-23 DIAGNOSIS — G8929 Other chronic pain: Secondary | ICD-10-CM | POA: Insufficient documentation

## 2016-08-23 DIAGNOSIS — E669 Obesity, unspecified: Secondary | ICD-10-CM | POA: Diagnosis not present

## 2016-08-23 DIAGNOSIS — J349 Unspecified disorder of nose and nasal sinuses: Secondary | ICD-10-CM | POA: Diagnosis present

## 2016-08-23 DIAGNOSIS — I951 Orthostatic hypotension: Secondary | ICD-10-CM | POA: Insufficient documentation

## 2016-08-23 DIAGNOSIS — J45909 Unspecified asthma, uncomplicated: Secondary | ICD-10-CM | POA: Insufficient documentation

## 2016-08-23 DIAGNOSIS — H5789 Other specified disorders of eye and adnexa: Secondary | ICD-10-CM | POA: Insufficient documentation

## 2016-08-23 DIAGNOSIS — H578 Other specified disorders of eye and adnexa: Secondary | ICD-10-CM | POA: Diagnosis not present

## 2016-08-23 MED ORDER — SULFAMETHOXAZOLE-TRIMETHOPRIM 800-160 MG PO TABS: 1.0000 | ORAL_TABLET | Freq: Two times a day (BID) | ORAL | 0 refills | Status: AC

## 2016-08-23 MED ORDER — OLOPATADINE HCL 0.7 % OP SOLN: 1.0000 [drp] | Freq: Every day | OPHTHALMIC | 2 refills | Status: AC

## 2016-08-23 NOTE — Progress Notes (Signed)
Monica Chase, is a 41 y.o. female  LYY:[id number]  LEX:[id number]  DOB - January 03, 1976  Chief Complaint  Patient presents with  . Sinus Problem      Subjective:   Monica Chase is a 41 y.o. female with history of chronic back pain, orthostatic hypotension, asthma, and hyperthyroidism here today for a follow up visit. She was seen here few weeks ago for sinusitis, she did not complete the antibiotics because after 2 days she developed vaginal candidal infection, which she treated with OTC meds. However, her sinus symptoms persists. She presents today for an alternative to Doxycycline. She denies fever, no headache. She is doing very well with her thyroid issues, not depressed, ambulatory now without cane or walker. She said "I got my miracle from God". She denies any suicidal ideations or thoughts.  Patient has No chest pain, No abdominal pain, No Nausea, No new weakness tingling or numbness, No Cough, SOB.  Problem  Acute Recurrent Maxillary Sinusitis  Eye Discharge   ALLERGIES: Allergies  Allergen Reactions  . Penicillins Other (See Comments)    Reaction unknown  . Azithromycin Rash  . Bacitracin-Lidocaine [Lidocaine-Bacitracin] Rash    PAST MEDICAL HISTORY: Past Medical History:  Diagnosis Date  . ANXIETY 07/26/2006  . ASTHMA, INTERMITTENT 07/26/2006  . CONSTIPATION, CHRONIC 06/22/2010  . DEPRESSION, MAJOR, RECURRENT 07/26/2006  . Dizziness 11/09/2010  . Fibroid   . HYPERGLYCEMIA 09/28/2009  . HYPERTHYROIDISM 07/20/2009  . OBESITY 01/31/2008  . PTSD (post-traumatic stress disorder)    MEDICATIONS AT HOME: Prior to Admission medications   Medication Sig Start Date End Date Taking? Authorizing Provider  albuterol (PROVENTIL HFA;VENTOLIN HFA) 108 (90 Base) MCG/ACT inhaler Inhale 2 puffs into the lungs every 4 (four) hours as needed for wheezing or shortness of breath. 03/09/16  Yes Tresa Garter, MD  Cholecalciferol (VITAMIN D PO) Take by mouth.   Yes Historical  Provider, MD  ferrous sulfate 325 (65 FE) MG tablet Take 325 mg by mouth daily with breakfast.   Yes Historical Provider, MD  midodrine (PROAMATINE) 5 MG tablet take 1 tablet by mouth three times a day with meals 04/18/16  Yes Dionne Bucy McClung, PA-C  phenylephrine (NEO-SYNEPHRINE) 1 % nasal spray Place 1 drop into both nostrils every 6 (six) hours as needed for congestion (Do not use more than 5 days). 07/07/16  Yes Roger Roderic Ovens, PA-C  propylthiouracil (PTU) 50 MG tablet Take 1 tablet (50 mg total) by mouth daily. 05/01/16  Yes Tresa Garter, MD  sodium chloride (OCEAN) 0.65 % SOLN nasal spray Place 1 spray into both nostrils as needed for congestion. 07/07/16  Yes Roger Roderic Ovens, PA-C  fluticasone [facility]) 50 MCG/ACT nasal spray Place 2 sprays into both nostrils daily. Patient not taking: Reported on 07/07/2016 04/18/16   Argentina Donovan, PA-C  Olopatadine HCl (PAZEO) 0.7 % SOLN Apply 1 drop to eye daily. 08/23/16   Tresa Garter, MD  sulfamethoxazole-trimethoprim (BACTRIM DS,SEPTRA DS) 800-160 MG tablet Take 1 tablet by mouth 2 (two) times daily. 08/23/16 08/30/16  Tresa Garter, MD  triamcinolone (NASACORT AQ) 55 MCG/ACT AERO nasal inhaler Place 2 sprays into the nose daily. Patient not taking: Reported on 04/18/2016 02/20/16   Sherlene Shams, MD    Objective:   Vitals:   08/23/16 0902  BP: 116/78  Pulse: 64  Resp: 18  Temp: 98.7 F (37.1 C)  TempSrc: Oral  SpO2: 100%  Weight: 213 lb 9.6 oz (96.9 kg)  Height: 5\' 11"  (1.803  m)   Exam General appearance : Awake, alert, not in any distress. Speech Clear. Not toxic looking HEENT: Atraumatic and Normocephalic, pupils equally reactive to light and accomodation Neck: Supple, no JVD. No cervical lymphadenopathy.  Chest: Good air entry bilaterally, no added sounds  CVS: S1 S2 regular, no murmurs.  Abdomen: Bowel sounds present, Non tender and not distended with no gaurding, rigidity or rebound. Extremities: B/L Lower Ext  shows no edema, both legs are warm to touch Neurology: Awake alert, and oriented X 3, CN II-XII intact, Non focal Skin: No Rash  Data Review Lab Results  Component Value Date   HGBA1C 4.90 07/12/2015   HGBA1C 5.0 11/09/2010    Assessment & Plan   1. Acute recurrent maxillary sinusitis  - sulfamethoxazole-trimethoprim (BACTRIM DS,SEPTRA DS) 800-160 MG tablet; Take 1 tablet by mouth 2 (two) times daily.  Dispense: 14 tablet; Refill: 0  2. Hyperthyroidism  - TSH + free T4  3. Eye discharge  - Olopatadine HCl (PAZEO) 0.7 % SOLN; Apply 1 drop to eye daily.  Dispense: 5 mL; Refill: 2  Patient have been counseled extensively about nutrition and exercise. Other issues discussed during this visit include: low cholesterol diet, weight control and daily exercise, importance of adherence with medications and regular follow-up.   Return in about 6 months (around 02/23/2017) for Hyperthyroidism, Allergies.  The patient was given clear instructions to go to ER or return to medical center if symptoms don't improve, worsen or new problems develop. The patient verbalized understanding. The patient was told to call to get lab results if they haven't heard anything in the next week.   This note has been created with Surveyor, quantity. Any transcriptional errors are unintentional.    Angelica Chessman, MD, [place], Karilyn Cota, [place] and [facility] [place], [place]   08/23/2016, 9:37 AM

## 2016-08-23 NOTE — Progress Notes (Signed)
Patient is here for Sinus concern  Patient denies pain at this time.  Patient has taken medication today. Patient has eaten today.

## 2016-08-23 NOTE — Patient Instructions (Signed)

## 2016-08-24 ENCOUNTER — Telehealth: Payer: Self-pay | Admitting: *Deleted

## 2016-08-24 LAB — TSH+FREE T4
FREE T4: 1.08 ng/dL (ref 0.82–1.77)
TSH: 1.18 u[IU]/mL (ref 0.450–4.500)

## 2016-08-24 NOTE — Telephone Encounter (Signed)
-----   Message from Tresa Garter, MD sent at 08/24/2016  1:48 PM EDT ----- Normal Thyroid Function

## 2016-08-24 NOTE — Telephone Encounter (Signed)
Patient verified DOB Patient is aware of thyroid function is normal. Patient had no further questions.

## 2016-10-05 ENCOUNTER — Ambulatory Visit: Payer: Medicaid Other | Attending: Internal Medicine | Admitting: Physician Assistant

## 2016-10-05 ENCOUNTER — Encounter: Payer: Self-pay | Admitting: Physician Assistant

## 2016-10-05 VITALS — BP 119/80 | HR 57 | Temp 97.9°F | Resp 16 | Wt 211.0 lb

## 2016-10-05 DIAGNOSIS — F4310 Post-traumatic stress disorder, unspecified: Secondary | ICD-10-CM | POA: Diagnosis not present

## 2016-10-05 DIAGNOSIS — Z6829 Body mass index (BMI) 29.0-29.9, adult: Secondary | ICD-10-CM | POA: Diagnosis not present

## 2016-10-05 DIAGNOSIS — E0590 Thyrotoxicosis, unspecified without thyrotoxic crisis or storm: Secondary | ICD-10-CM | POA: Diagnosis not present

## 2016-10-05 DIAGNOSIS — E669 Obesity, unspecified: Secondary | ICD-10-CM | POA: Insufficient documentation

## 2016-10-05 DIAGNOSIS — J322 Chronic ethmoidal sinusitis: Secondary | ICD-10-CM | POA: Diagnosis not present

## 2016-10-05 DIAGNOSIS — J45909 Unspecified asthma, uncomplicated: Secondary | ICD-10-CM | POA: Diagnosis not present

## 2016-10-05 DIAGNOSIS — R21 Rash and other nonspecific skin eruption: Secondary | ICD-10-CM | POA: Diagnosis present

## 2016-10-05 DIAGNOSIS — L237 Allergic contact dermatitis due to plants, except food: Secondary | ICD-10-CM

## 2016-10-05 DIAGNOSIS — Z881 Allergy status to other antibiotic agents status: Secondary | ICD-10-CM | POA: Insufficient documentation

## 2016-10-05 DIAGNOSIS — Z880 Allergy status to penicillin: Secondary | ICD-10-CM | POA: Diagnosis not present

## 2016-10-05 DIAGNOSIS — Z888 Allergy status to other drugs, medicaments and biological substances status: Secondary | ICD-10-CM | POA: Insufficient documentation

## 2016-10-05 MED ORDER — FLUTICASONE PROPIONATE 50 MCG/ACT NA SUSP: 2.0000 | Freq: Every day | NASAL | 6 refills | Status: DC

## 2016-10-05 MED ORDER — TRIAMCINOLONE ACETONIDE 0.1 % EX CREA
1.0000 | TOPICAL_CREAM | Freq: Two times a day (BID) | CUTANEOUS | 0 refills | Status: DC
Start: 2016-10-05 — End: 2016-10-10

## 2016-10-05 MED ORDER — PREDNISONE 10 MG PO TABS: ORAL_TABLET | ORAL | 0 refills | Status: DC

## 2016-10-05 NOTE — Progress Notes (Signed)
Monica Chase, is a 41 y.o. female  AVW:[id number]  [id number]:[id number]  DOB - 07/02/1975  Subjective:  Chief Complaint and HPI: Monica Chase is a 41 y.o. female here today for 3 day h/o sinus pain and pressure and blowing yellow from her nose.  Was treated at the end of March 2018 with Septra DS for sinusitis and just prior to that a course of Doxy.  No f/c.  + nasal and sinus congestion/pain/pressure.  No ST.  + drainage.  She has been using flonase about 2 months  Also came into contact with poison Ivy about 3 days ago and has rash and itching on B arms, hands, and chest.  Zyrtec makes her sleepy.   ROS:   Constitutional:  No f/c, No night sweats, No unexplained weight loss. EENT:  No vision changes, No blurry vision, No hearing changes. +see above Respiratory: No cough, No SOB Cardiac: No CP, no palpitations GI:  No abd pain, No N/V/D. GU: No Urinary s/sx Musculoskeletal: No joint pain Neuro: No headache, no dizziness, no motor weakness.  Skin: + rash Endocrine:  No polydipsia. No polyuria.  Psych: Denies SI/HI  No problems updated.  ALLERGIES: Allergies  Allergen Reactions  . Penicillins Other (See Comments)    Reaction unknown  . Azithromycin Rash  . Bacitracin-Lidocaine [Lidocaine-Bacitracin] Rash    PAST MEDICAL HISTORY: Past Medical History:  Diagnosis Date  . ANXIETY 07/26/2006  . ASTHMA, INTERMITTENT 07/26/2006  . CONSTIPATION, CHRONIC 06/22/2010  . DEPRESSION, MAJOR, RECURRENT 07/26/2006  . Dizziness 11/09/2010  . Fibroid   . HYPERGLYCEMIA 09/28/2009  . HYPERTHYROIDISM 07/20/2009  . OBESITY 01/31/2008  . PTSD (post-traumatic stress disorder)     MEDICATIONS AT HOME: Prior to Admission medications   Medication Sig Start Date End Date Taking? Authorizing Provider  albuterol (PROVENTIL HFA;VENTOLIN HFA) 108 (90 Base) MCG/ACT inhaler Inhale 2 puffs into the lungs every 4 (four) hours as needed for wheezing or shortness of breath. 03/09/16   Tresa Garter, MD  Cholecalciferol (VITAMIN D PO) Take by mouth.    [provider]  ferrous sulfate 325 (65 FE) MG tablet Take 325 mg by mouth daily with breakfast.    [provider]  fluticasone (FLONASE) 50 MCG/ACT nasal spray Place 2 sprays into both nostrils daily. 10/05/16   Argentina Donovan, PA-C  midodrine (PROAMATINE) 5 MG tablet take 1 tablet by mouth three times a day with meals 04/18/16   Freeman Caldron M, PA-C  Olopatadine HCl (PAZEO) 0.7 % SOLN Apply 1 drop to eye daily. 08/23/16   Tresa Garter, MD  phenylephrine (NEO-SYNEPHRINE) 1 % nasal spray Place 1 drop into both nostrils every 6 (six) hours as needed for congestion (Do not use more than 5 days). 07/07/16   Clent Demark, PA-C  predniSONE (DELTASONE) 10 MG tablet 6,5,4,3,2,1 Take each days dose all at once in morning with food 10/05/16   Argentina Donovan, PA-C  propylthiouracil (PTU) 50 MG tablet Take 1 tablet (50 mg total) by mouth daily. 05/01/16   Tresa Garter, MD  sodium chloride (OCEAN) 0.65 % SOLN nasal spray Place 1 spray into both nostrils as needed for congestion. 07/07/16   Clent Demark, PA-C  triamcinolone (NASACORT AQ) 55 MCG/ACT AERO nasal inhaler Place 2 sprays into the nose daily. Patient not taking: Reported on 04/18/2016 02/20/16   Sherlene Shams, MD  triamcinolone cream (KENALOG) 0.1 % Apply 1 application topically 2 (two) times daily. Prn itching 10/05/16  Argentina Donovan, PA-C     Objective:  EXAM:   Vitals:   10/05/16 1057  BP: 119/80  Pulse: (!) 57  Resp: 16  Temp: 97.9 F (36.6 C)  TempSrc: Oral  SpO2: 96%  Weight: 211 lb (95.7 kg)    General appearance : A&OX3. NAD. Non-toxic-appearing HEENT: Atraumatic and Normocephalic.  PERRLA. EOM intact.  TM congested B w/o infection. Mouth-MMM, post pharynx WNL w/o erythema, + PND.  Turbinates enlarged and boggy. Neck: supple, no JVD. No cervical lymphadenopathy. No thyromegaly Chest/Lungs:   Breathing-non-labored, Good air entry bilaterally, breath sounds normal without rales, rhonchi, or wheezing  CVS: S1 S2 regular, no murmurs, gallops, rubs  Extremities: Bilateral Lower Ext shows no edema, both legs are warm to touch with = pulse throughout Neurology:  CN II-XII grossly intact, Non focal.   Psych:  TP linear. J/I WNL. Normal speech. Appropriate eye contact and affect.  Skin:  Contact distribution vesicles on arms, chest and hands.  No secondary infection.  Data Review Lab Results  Component Value Date   HGBA1C 4.90 07/12/2015   HGBA1C 5.0 11/09/2010     Assessment & Plan   1. Ethmoid sinusitis, unspecified chronicity Due to this being a recurrent problem with multiple use of antibiotics- - fluticasone (FLONASE) 50 MCG/ACT nasal spray; Place 2 sprays into both nostrils daily.  Dispense: 16 g; Refill: 6 - CT MAXILLOFACIAL WO CONTRAST; Future Consider ENT referral if warrnated  2. Poison ivy dermatitis - triamcinolone cream (KENALOG) 0.1 %; Apply 1 application topically 2 (two) times daily. Prn itching  Dispense: 30 g; Refill: 0 - predniSONE (DELTASONE) 10 MG tablet; 6,5,4,3,2,1 Take each days dose all at once in morning with food  Dispense: 21 tablet; Refill: 0   Patient have been counseled extensively about nutrition and exercise  Return in about 5 months (around 03/07/2017) for Dr Gunnar Fusi for 6 month f/up.  The patient was given clear instructions to go to ER or return to medical center if symptoms don't improve, worsen or new problems develop. The patient verbalized understanding. The patient was told to call to get lab results if they haven't heard anything in the next week.     Freeman Caldron, PA-C [facility] and [facility] [place], [place]   10/05/2016, 11:12 AM

## 2016-10-10 ENCOUNTER — Ambulatory Visit: Payer: Medicaid Other | Attending: Family Medicine | Admitting: Family Medicine

## 2016-10-10 ENCOUNTER — Encounter: Payer: Self-pay | Admitting: Family Medicine

## 2016-10-10 ENCOUNTER — Ambulatory Visit (HOSPITAL_COMMUNITY)
Admission: RE | Admit: 2016-10-10 | Discharge: 2016-10-10 | Disposition: A | Discharge status: Home / Self Care | Payer: Medicaid Other | Source: Ambulatory Visit | Attending: Physician Assistant | Admitting: Physician Assistant

## 2016-10-10 VITALS — BP 111/70 | HR 64 | Temp 98.4°F | Resp 18 | Ht 71.0 in | Wt 208.0 lb

## 2016-10-10 DIAGNOSIS — J341 Cyst and mucocele of nose and nasal sinus: Secondary | ICD-10-CM | POA: Insufficient documentation

## 2016-10-10 DIAGNOSIS — L247 Irritant contact dermatitis due to plants, except food: Secondary | ICD-10-CM | POA: Diagnosis not present

## 2016-10-10 DIAGNOSIS — R0981 Nasal congestion: Secondary | ICD-10-CM

## 2016-10-10 DIAGNOSIS — L237 Allergic contact dermatitis due to plants, except food: Secondary | ICD-10-CM | POA: Diagnosis not present

## 2016-10-10 DIAGNOSIS — R21 Rash and other nonspecific skin eruption: Secondary | ICD-10-CM | POA: Diagnosis present

## 2016-10-10 DIAGNOSIS — J322 Chronic ethmoidal sinusitis: Secondary | ICD-10-CM | POA: Insufficient documentation

## 2016-10-10 MED ORDER — PREDNISONE 10 MG PO TABS: ORAL_TABLET | ORAL | 0 refills | Status: DC

## 2016-10-10 MED ORDER — CALAMINE EX LOTN: 1.0000 "application " | TOPICAL_LOTION | CUTANEOUS | 0 refills | Status: AC | PRN

## 2016-10-10 MED ORDER — CLOBETASOL PROPIONATE 0.05 % EX CREA
1.0000 | TOPICAL_CREAM | Freq: Two times a day (BID) | CUTANEOUS | 1 refills | Status: DC
Start: 2016-10-10 — End: 2017-01-02

## 2016-10-10 MED ORDER — FEXOFENADINE-PSEUDOEPHED ER 180-240 MG PO TB24: 1.0000 | ORAL_TABLET | Freq: Every day | ORAL | 0 refills | Status: DC

## 2016-10-10 MED ORDER — COLLOIDAL OATMEAL BATH EX PACK: 1.0000 | PACK | Freq: Every day | CUTANEOUS | Status: AC

## 2016-10-10 NOTE — Progress Notes (Signed)
Subjective:  Patient ID: Monica Chase, female    DOB: 04-26-1976  Age: 41 y.o. MRN: [id number]  CC: Rash   HPI Monica Chase presents for   Dermatitis: Patient complains of a rash. Symptoms began 7 days ago. Patient describes the rash as diffuse, scattered, maculopapular, pruritic rash. Denie any constitutional symptoms. Denies similar problem in household members. Patient's previous dermatologic history includes contact dermatitis due to plants. Medications currently using: prednisone and triamcinolone. She reports minimal relief of symptoms. Environmental exposures or allergies: poison ivy   Outpatient Medications Prior to Visit  Medication Sig Dispense Refill  . albuterol (PROVENTIL HFA;VENTOLIN HFA) 108 (90 Base) MCG/ACT inhaler Inhale 2 puffs into the lungs every 4 (four) hours as needed for wheezing or shortness of breath. 1 Inhaler 3  . Cholecalciferol (VITAMIN D PO) Take by mouth.    . ferrous sulfate 325 (65 FE) MG tablet Take 325 mg by mouth daily with breakfast.    . fluticasone (FLONASE) 50 MCG/ACT nasal spray Place 2 sprays into both nostrils daily. 16 g 6  . midodrine (PROAMATINE) 5 MG tablet take 1 tablet by mouth three times a day with meals 90 tablet 2  . Olopatadine HCl (PAZEO) 0.7 % SOLN Apply 1 drop to eye daily. 5 mL 2  . phenylephrine (NEO-SYNEPHRINE) 1 % nasal spray Place 1 drop into both nostrils every 6 (six) hours as needed for congestion (Do not use more than 5 days). 30 mL 0  . propylthiouracil (PTU) 50 MG tablet Take 1 tablet (50 mg total) by mouth daily. 90 tablet 3  . sodium chloride (OCEAN) 0.65 % SOLN nasal spray Place 1 spray into both nostrils as needed for congestion. 1 Bottle 0  . triamcinolone (NASACORT AQ) 55 MCG/ACT AERO nasal inhaler Place 2 sprays into the nose daily. (Patient not taking: Reported on 04/18/2016) 1 Inhaler 0  . predniSONE (DELTASONE) 10 MG tablet 6,5,4,3,2,1 Take each days dose all at once in morning with food 21 tablet  0  . triamcinolone cream (KENALOG) 0.1 % Apply 1 application topically 2 (two) times daily. Prn itching 30 g 0   No facility-administered medications prior to visit.     ROS Review of Systems  Constitutional: Negative.   HENT: Negative.   Eyes: Negative.   Respiratory: Negative.   Cardiovascular: Negative.   Gastrointestinal: Negative.   Skin: Positive for rash.        Objective:  BP 111/70 (BP Location: Left Arm, Patient Position: Sitting, Cuff Size: Normal)   Pulse 64   Temp 98.4 F (36.9 C) (Oral)   Resp 18   Ht 5\' 11"  (1.803 m)   Wt 208 lb (94.3 kg)   LMP 06/18/2013   SpO2 97%   BMI 29.01 kg/m   BP/Weight 10/10/2016 10/05/2016 10/28/930  Systolic BP 355 732 202  Diastolic BP 70 80 78  Wt. (Lbs) 208 211 213.6  BMI 29.01 29.43 29.79     Physical Exam  Constitutional: She appears well-developed and well-nourished.  HENT:  Head: Normocephalic and atraumatic.  Right Ear: External ear normal.  Left Ear: External ear normal.  Nose: Nose normal.  Mouth/Throat: Oropharynx is clear and moist.  Eyes: Conjunctivae are normal. Pupils are equal, round, and reactive to light.  Cardiovascular: Normal rate, regular rhythm, normal heart sounds and intact distal pulses.   Pulmonary/Chest: Effort normal and breath sounds normal.  Abdominal: Soft. Bowel sounds are normal.  Skin: Skin is warm and dry. Rash (generalized maculopapular rash to  bilataral arms, chest, hands.) noted.  Nursing note and vitals reviewed.    Assessment & Plan:   Problem List Items Addressed This Visit    None    Visit Diagnoses    Irritant contact dermatitis due to plants, except food    -  Primary   Relevant Medications   calamine lotion   colloidal oatmeal PACK   clobetasol cream (TEMOVATE) 0.05 %   fexofenadine-pseudoephedrine (ALLEGRA-D ALLERGY & CONGESTION) 180-240 MG 24 hr tablet   Nasal congestion       Relevant Medications   fexofenadine-pseudoephedrine (ALLEGRA-D ALLERGY &  CONGESTION) 180-240 MG 24 hr tablet   Poison ivy dermatitis       Relevant Medications   predniSONE (DELTASONE) 10 MG tablet      Meds ordered this encounter  Medications  . calamine lotion    Sig: Apply 1 application topically as needed for itching.    Dispense:  120 mL    Refill:  0    Order Specific Question:   Supervising Provider    Answer:   Tresa Garter [id number]  . colloidal oatmeal PACK    Sig: Apply 1 each topically daily. For 14 days.    Order Specific Question:   Supervising Provider    Answer:   Tresa Garter [id number]  . clobetasol cream (TEMOVATE) 0.05 %    Sig: Apply 1 application topically 2 (two) times daily. For 14 days.    Dispense:  30 g    Refill:  1    Order Specific Question:   Supervising Provider    Answer:   Tresa Garter [id number]  . fexofenadine-pseudoephedrine (ALLEGRA-D ALLERGY & CONGESTION) 180-240 MG 24 hr tablet    Sig: Take 1 tablet by mouth daily.    Dispense:  24 tablet    Refill:  0    Order Specific Question:   Supervising Provider    Answer:   Tresa Garter [id number]  . predniSONE (DELTASONE) 10 MG tablet    Sig: 6,5,4,3,2,1 Take each days dose all at once in morning with food    Dispense:  21 tablet    Refill:  0    Order Specific Question:   Supervising Provider    Answer:   Tresa Garter [[id number]]    Follow-up: Return if symptoms worsen or fail to improve.   Alfonse Spruce FNP

## 2016-10-10 NOTE — Patient Instructions (Signed)
Poison Ivy Dermatitis Poison ivy dermatitis is inflammation of the skin that is caused by the allergens on the leaves of the poison ivy plant. The skin reaction often involves redness, swelling, blisters, and extreme itching. What are the causes? This condition is caused by a specific chemical (urushiol) found in the sap of the poison ivy plant. This chemical is sticky and can be easily spread to people, animals, and objects. You can get poison ivy dermatitis by:  Having direct contact with a poison ivy plant.  Touching animals, other people, or objects that have come in contact with poison ivy and have the chemical on them. What increases the risk? This condition is more likely to develop in:  People who are outdoors often.  People who go outdoors without wearing protective clothing, such as closed shoes, long pants, and a long-sleeved shirt. What are the signs or symptoms? Symptoms of this condition include:  Redness and itching.  A rash that often includes bumps and blisters. The rash usually appears 48 hours after exposure.  Swelling. This may occur if the reaction is more severe. Symptoms usually last for 1-2 weeks. However, the first time you develop this condition, symptoms may last 3-4 weeks. How is this diagnosed? This condition may be diagnosed based on your symptoms and a physical exam. Your health care provider may also ask you about any recent outdoor activity. How is this treated? Treatment for this condition will vary depending on how severe it is. Treatment may include:  Hydrocortisone creams or calamine lotions to relieve itching.  Oatmeal baths to soothe the skin.  Over-the-counter antihistamine tablets.  Oral steroid medicine for more severe outbreaks. Follow these instructions at home:  Take or apply over-the-counter and prescription medicines only as told by your health care provider.  Wash exposed skin as soon as possible with soap and cold water.  Use  hydrocortisone creams or calamine lotion as needed to soothe the skin and relieve itching.  Take oatmeal baths as needed. Use colloidal oatmeal. You can get this at your local pharmacy or grocery store. Follow the instructions on the packaging.  Do not scratch or rub your skin.  While you have the rash, wash clothes right after you wear them. How is this prevented?  Learn to identify the poison ivy plant and avoid contact with the plant. This plant can be recognized by the number of leaves. Generally, poison ivy has three leaves with flowering branches on a single stem. The leaves are typically glossy, and they have jagged edges that come to a point at the front.  If you have been exposed to poison ivy, thoroughly wash with soap and water right away. You have about 30 minutes to remove the plant resin before it will cause the rash. Be sure to wash under your fingernails because any plant resin there will continue to spread the rash.  When hiking or camping, wear clothes that will help you to avoid exposure on the skin. This includes long pants, a long-sleeved shirt, tall socks, and hiking boots. You can also apply preventive lotion to your skin to help limit exposure.  If you suspect that your clothes or outdoor gear came in contact with poison ivy, rinse them off outside with a garden hose before you bring them inside your house. Contact a health care provider if:  You have open sores in the rash area.  You have more redness, swelling, or pain in the affected area.  You have redness that spreads beyond  the rash area.  You have fluid, blood, or pus coming from the affected area.  You have a fever.  You have a rash over a large area of your body.  You have a rash on your eyes, mouth, or genitals.  Your rash does not improve after a few days. Get help right away if:  Your face swells or your eyes swell shut.  You have trouble breathing.  You have trouble swallowing. This  information is not intended to replace advice given to you by your health care provider. Make sure you discuss any questions you have with your health care provider. Document Released: 05/12/2000 Document Revised: 10/21/2015 Document Reviewed: 10/21/2014 Elsevier Interactive Patient Education  2017 [employer].

## 2016-10-10 NOTE — Progress Notes (Signed)
Patient is here for poison Ivy   Patient has face pain  Patient has eaten for today Patient has not taking any medication for today

## 2016-10-11 ENCOUNTER — Telehealth: Payer: Self-pay | Admitting: Internal Medicine

## 2016-10-11 NOTE — Telephone Encounter (Signed)
PT called to request the result of her CT scan and what are you going to do about her sinus, pleas follow up with PT

## 2016-10-12 ENCOUNTER — Telehealth: Payer: Self-pay

## 2016-10-12 ENCOUNTER — Telehealth: Payer: Self-pay | Admitting: Internal Medicine

## 2016-10-12 NOTE — Telephone Encounter (Signed)
Contacted pt to go over CT results pt is aware of results and doesn't have any questions or concerns 

## 2016-10-12 NOTE — Telephone Encounter (Signed)
Pt calling to request results of her MRI, states that her sx are getting worse. Requests a call from Dr Doreene Burke or [place] to go over results. Thank you.

## 2016-10-13 NOTE — Telephone Encounter (Signed)
Resolved

## 2016-11-02 ENCOUNTER — Telehealth: Payer: Self-pay | Admitting: *Deleted

## 2016-11-02 NOTE — Telephone Encounter (Signed)
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 Patients letter will be mailed to her home.

## 2016-11-07 ENCOUNTER — Other Ambulatory Visit: Payer: Self-pay | Admitting: Internal Medicine

## 2016-11-07 DIAGNOSIS — I951 Orthostatic hypotension: Secondary | ICD-10-CM

## 2016-12-21 ENCOUNTER — Encounter (HOSPITAL_COMMUNITY): Payer: Self-pay

## 2016-12-21 ENCOUNTER — Emergency Department (HOSPITAL_COMMUNITY)
Admission: EM | Admit: 2016-12-21 | Discharge: 2016-12-21 | Disposition: A | Discharge status: Home / Self Care | Payer: Medicaid Other | Attending: Emergency Medicine | Admitting: Emergency Medicine

## 2016-12-21 DIAGNOSIS — E0590 Thyrotoxicosis, unspecified without thyrotoxic crisis or storm: Secondary | ICD-10-CM | POA: Insufficient documentation

## 2016-12-21 DIAGNOSIS — Z79899 Other long term (current) drug therapy: Secondary | ICD-10-CM | POA: Diagnosis not present

## 2016-12-21 DIAGNOSIS — Z87891 Personal history of nicotine dependence: Secondary | ICD-10-CM | POA: Diagnosis not present

## 2016-12-21 DIAGNOSIS — Z880 Allergy status to penicillin: Secondary | ICD-10-CM | POA: Insufficient documentation

## 2016-12-21 DIAGNOSIS — R21 Rash and other nonspecific skin eruption: Secondary | ICD-10-CM | POA: Diagnosis present

## 2016-12-21 DIAGNOSIS — J4520 Mild intermittent asthma, uncomplicated: Secondary | ICD-10-CM | POA: Insufficient documentation

## 2016-12-21 DIAGNOSIS — T7840XA Allergy, unspecified, initial encounter: Secondary | ICD-10-CM | POA: Diagnosis not present

## 2016-12-21 MED ORDER — DIPHENHYDRAMINE HCL 25 MG PO CAPS: 25.0000 mg | ORAL_CAPSULE | Freq: Four times a day (QID) | ORAL | 0 refills | Status: DC | PRN

## 2016-12-21 MED ORDER — PREDNISONE 20 MG PO TABS
60.0000 mg | ORAL_TABLET | Freq: Once | ORAL | Status: AC
  Administered 2016-12-21: 60 mg via ORAL
  Filled 2016-12-21: qty 3

## 2016-12-21 MED ORDER — DIPHENHYDRAMINE HCL 25 MG PO CAPS
25.0000 mg | ORAL_CAPSULE | Freq: Once | ORAL | Status: AC
  Administered 2016-12-21: 25 mg via ORAL
  Filled 2016-12-21: qty 1

## 2016-12-21 MED ORDER — PREDNISONE 10 MG PO TABS: 40.0000 mg | ORAL_TABLET | Freq: Every day | ORAL | 0 refills | Status: AC

## 2016-12-21 NOTE — ED Triage Notes (Signed)
Pt here for allergic rxn sts ate fried chicken on Tuesday and has fine rash above eyes , no airway involvement, pt left eye is more swollen at this time.

## 2016-12-21 NOTE — ED Provider Notes (Signed)
[facility] DEPT Provider Note   CSN: [id number] Arrival date & time: 12/21/16  0001     History   Chief Complaint Chief Complaint  Patient presents with  . Rash    HPI Monica Chase is a 41 y.o. female.  HPI  41 y.o. female presents to the Emergency Department today due to allergic reaction on Tuesday. Notes eating fried chicken. Thinks it might have been a seasoning. Rash above eyes. Noted rash on BUE/BLE, Torso and back. Itching. Not painful. No purulence or oozing from rash. No fevers. No cough or URI symptoms. No airway compromise. Speaking well. Tolerating PO. No difficulty swallowing. No pain. No meds PTA. No other symptoms noted.    Past Medical History:  Diagnosis Date  . ANXIETY 07/26/2006  . ASTHMA, INTERMITTENT 07/26/2006  . CONSTIPATION, CHRONIC 06/22/2010  . DEPRESSION, MAJOR, RECURRENT 07/26/2006  . Dizziness 11/09/2010  . Fibroid   . HYPERGLYCEMIA 09/28/2009  . HYPERTHYROIDISM 07/20/2009  . OBESITY 01/31/2008  . PTSD (post-traumatic stress disorder)     Patient Active Problem List   Diagnosis Date Noted  . Acute recurrent maxillary sinusitis 08/23/2016  . Eye discharge 08/23/2016  . 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 Muscle cramps 01/20/2016  . Vitamin D deficiency 08/27/2015  . Chronic midline low back pain without sciatica 08/26/2015  . Orthostatic hypotension 02/16/2015  . Near syncope 02/05/2015  . Headache 02/05/2015  . Atypical chest pain 08/05/2014  . Herpes zoster 12/01/2013  . Benign paroxysmal positional vertigo 11/13/2013  . Hemorrhoid 04/16/2013  . Hypercholesterolemia 08/05/2012  . Dental caries 03/19/2012  . Dizziness 11/09/2010  . CONSTIPATION, CHRONIC 06/22/2010  . Hyperthyroidism 07/20/2009  . Obesity 01/31/2008  . Episodic mood disorder ([place]) 07/26/2006  . Anxiety state 07/26/2006  . ASTHMA, INTERMITTENT 07/26/2006    Past Surgical History:  Procedure Laterality Date  . BILATERAL SALPINGECTOMY Bilateral 07/07/2013   Procedure: BILATERAL SALPINGECTOMY;  Surgeon: Donnamae Jude, MD;  Location: [facility];  Service: Gynecology;  Laterality: Bilateral;  . IUD REMOVAL  10/07   Mirena removed 05/10/2004  . PILONIDAL CYST EXCISION    . TUBAL LIGATION  2008  . VAGINAL DELIVERY     X 4  . VAGINAL HYSTERECTOMY N/A 07/07/2013   Procedure: HYSTERECTOMY VAGINAL;  Surgeon: Donnamae Jude, MD;  Location: [facility];  Service: Gynecology;  Laterality: N/A;    OB History    Gravida Para Term Preterm AB Living   4 4 4     4    SAB TAB Ectopic Multiple Live Births                   Home Medications    Prior to Admission medications   Medication Sig Start Date End Date Taking? Authorizing Provider  albuterol (PROVENTIL HFA;VENTOLIN HFA) 108 (90 Base) MCG/ACT inhaler Inhale 2 puffs into the lungs every 4 (four) hours as needed for wheezing or shortness of breath. 03/09/16   Tresa Garter, MD  calamine lotion Apply 1 application topically as needed for itching. 10/10/16   Alfonse Spruce, FNP  Cholecalciferol (VITAMIN D PO) Take by mouth.    [provider]  clobetasol cream (TEMOVATE) 9.16 % Apply 1 application topically 2 (two) times daily. For 14 days. 10/10/16   Alfonse Spruce, FNP  colloidal oatmeal PACK Apply 1 each topically daily. For 14 days. 10/10/16   Alfonse Spruce, FNP  ferrous sulfate 325 (65 FE) MG tablet Take 325 mg by mouth daily with breakfast.  [provider]  fexofenadine-pseudoephedrine (ALLEGRA-D ALLERGY & CONGESTION) 180-240 MG 24 hr tablet Take 1 tablet by mouth daily. 10/10/16   Alfonse Spruce, FNP  fluticasone (FLONASE) 50 MCG/ACT nasal spray Place 2 sprays into both nostrils daily. 10/05/16   Argentina Donovan, PA-C  midodrine (PROAMATINE) 5 MG tablet TAKE 1 TABLET BY MOUTH THREE TIMES A DAY WITH MEALS 11/08/16   Tresa Garter, MD  Olopatadine HCl (PAZEO) 0.7 % SOLN Apply 1 drop to eye daily. 08/23/16   Tresa Garter, MD  phenylephrine  (NEO-SYNEPHRINE) 1 % nasal spray Place 1 drop into both nostrils every 6 (six) hours as needed for congestion (Do not use more than 5 days). 07/07/16   Clent Demark, PA-C  predniSONE (DELTASONE) 10 MG tablet 6,5,4,3,2,1 Take each days dose all at once in morning with food 10/10/16   Alfonse Spruce, FNP  propylthiouracil (PTU) 50 MG tablet Take 1 tablet (50 mg total) by mouth daily. 05/01/16   Tresa Garter, MD  sodium chloride (OCEAN) 0.65 % SOLN nasal spray Place 1 spray into both nostrils as needed for congestion. 07/07/16   Clent Demark, PA-C  triamcinolone (NASACORT AQ) 55 MCG/ACT AERO nasal inhaler Place 2 sprays into the nose daily. Patient not taking: Reported on 04/18/2016 02/20/16   Sherlene Shams, MD    Family History Family History  Problem Relation Age of Onset  . Breast cancer Mother   . Hypertension Mother   . Diabetes Mother   . Hypertension Father   . Diabetes Father   . Breast cancer Maternal Aunt   . Ovarian cancer Maternal Aunt   . Hypertension Sister   . Hypertension Other   . Stomach cancer Paternal Grandmother   . Childhood respiratory disease Child     Social History Social History  Substance Use Topics  . Smoking status: Former Smoker    Quit date: 11/08/2005  . Smokeless tobacco: Never Used  . Alcohol use No     Comment: Quit 2009     Allergies   Penicillins; Azithromycin; and Bacitracin-lidocaine [lidocaine-bacitracin]   Review of Systems Review of Systems  Constitutional: Negative for fever.  Respiratory: Negative for shortness of breath.   Gastrointestinal: Negative for nausea and vomiting.  Skin: Positive for rash.     Physical Exam Updated Vital Signs LMP 06/18/2013   Physical Exam  Constitutional: She is oriented to person, place, and time. Vital signs are normal. She appears well-developed and well-nourished.  HENT:  Head: Normocephalic.  Right Ear: Hearing normal.  Left Ear: Hearing normal.  Airway intact.  Phonating well  Eyes: Pupils are equal, round, and reactive to light. Conjunctivae and EOM are normal.  Cardiovascular: Normal rate, regular rhythm, normal heart sounds and intact distal pulses.   Pulmonary/Chest: Effort normal and breath sounds normal.  Neurological: She is alert and oriented to person, place, and time.  Skin: Skin is warm and dry.  Diffuse urticarial rash noted. No hives. No signs of infection.   Psychiatric: She has a normal mood and affect. Her speech is normal and behavior is normal. Thought content normal.  Nursing note and vitals reviewed.    ED Treatments / Results  Labs (all labs ordered are listed, but only abnormal results are displayed) Labs Reviewed - No data to display  EKG  EKG Interpretation None       Radiology No results found.  Procedures Procedures (including critical care time)  Medications Ordered in ED Medications - No  data to display   Initial Impression / Assessment and Plan / ED Course  I have reviewed the triage vital signs and the nursing notes.  Pertinent labs & imaging results that were available during my care of the patient were reviewed by me and considered in my medical decision making (see chart for details).  Final Clinical Impressions(s) / ED Diagnoses     {I have reviewed the relevant previous healthcare records.  {I obtained HPI from historian.   ED Course:  Assessment: Pt is a 41 y.o. female resents to the Emergency Department today due to allergic reaction on Tuesday. Notes eating fried chicken. Thinks it might have been a seasoning. Rash above eyes. Noted rash on BUE/BLE, Torso and back. Itching. Not painful. No purulence or oozing from rash. No fevers. No cough or URI symptoms. No airway compromise. Speaking well. Tolerating PO. No difficulty swallowing. No pain. No meds PTA. On exam, pt in NAD. Nontoxic/nonseptic appearing. VSS. Afebrile. Lungs CTA. Heart RRR. Pt phonating well. No airway compromise. Given  steroids and benadryl in ED. Plan is to DC home with follow up to PCP. At time of discharge, Patient is in no acute distress. Vital Signs are stable. Patient is able to ambulate. Patient able to tolerate PO.   Disposition/Plan:  Dc Home Additional Verbal discharge instructions given and discussed with patient.  Pt Instructed to f/u with PCP in the next week for evaluation and treatment of symptoms. Return precautions given Pt acknowledges and agrees with plan  Supervising Physician Delora Fuel, MD  Final diagnoses:  Allergic reaction, initial encounter  Rash    New Prescriptions New Prescriptions   No medications on file     Shary Decamp, Hershal Coria 98/26/41 [id number]    Delora Fuel, MD 94/07/68 [phone]

## 2016-12-29 ENCOUNTER — Other Ambulatory Visit: Payer: Self-pay | Admitting: Internal Medicine

## 2016-12-29 DIAGNOSIS — Z1239 Encounter for other screening for malignant neoplasm of breast: Secondary | ICD-10-CM

## 2017-01-02 ENCOUNTER — Ambulatory Visit: Payer: Medicaid Other | Attending: Internal Medicine | Admitting: Physician Assistant

## 2017-01-02 ENCOUNTER — Encounter: Payer: Self-pay | Admitting: Physician Assistant

## 2017-01-02 VITALS — BP 116/77 | HR 66 | Temp 97.8°F | Resp 18 | Ht 71.0 in | Wt 200.0 lb

## 2017-01-02 DIAGNOSIS — Z881 Allergy status to other antibiotic agents status: Secondary | ICD-10-CM | POA: Insufficient documentation

## 2017-01-02 DIAGNOSIS — L42 Pityriasis rosea: Secondary | ICD-10-CM | POA: Insufficient documentation

## 2017-01-02 DIAGNOSIS — E669 Obesity, unspecified: Secondary | ICD-10-CM | POA: Insufficient documentation

## 2017-01-02 DIAGNOSIS — J45909 Unspecified asthma, uncomplicated: Secondary | ICD-10-CM | POA: Diagnosis not present

## 2017-01-02 DIAGNOSIS — Z6827 Body mass index (BMI) 27.0-27.9, adult: Secondary | ICD-10-CM | POA: Diagnosis not present

## 2017-01-02 DIAGNOSIS — Z880 Allergy status to penicillin: Secondary | ICD-10-CM | POA: Insufficient documentation

## 2017-01-02 DIAGNOSIS — E0590 Thyrotoxicosis, unspecified without thyrotoxic crisis or storm: Secondary | ICD-10-CM | POA: Diagnosis not present

## 2017-01-02 DIAGNOSIS — R739 Hyperglycemia, unspecified: Secondary | ICD-10-CM | POA: Diagnosis not present

## 2017-01-02 MED ORDER — TRIAMCINOLONE ACETONIDE 0.1 % EX CREA: 1.0000 "application " | TOPICAL_CREAM | Freq: Two times a day (BID) | CUTANEOUS | 0 refills | Status: DC

## 2017-01-02 MED ORDER — CETIRIZINE HCL 10 MG PO TABS: 10.0000 mg | ORAL_TABLET | Freq: Every day | ORAL | 11 refills | Status: AC

## 2017-01-02 NOTE — Patient Instructions (Signed)
Pityriasis Rosea Pityriasis rosea is a rash that usually appears on the trunk of the body. It may also appear on the upper arms and upper legs. It usually begins as a single patch, and then more patches begin to develop. The rash may cause mild itching, but it normally does not cause other problems. It usually goes away without treatment. However, it may take weeks or months for the rash to go away completely. What are the causes? The cause of this condition is not known. The condition does not spread from person to person (is noncontagious). What increases the risk? This condition is more likely to develop in young adults and children. It is most common in the spring and fall. What are the signs or symptoms? The main symptom of this condition is a rash.  The rash usually begins with a single oval patch that is larger than the ones that follow. This is called a herald patch. It generally appears a week or more before the rest of the rash appears.  When more patches start to develop, they spread quickly on the trunk, back, and arms. These patches are smaller than the first one.  The patches that make up the rash are usually oval-shaped and pink or red in color. They are usually flat, but they may sometimes be raised so that they can be felt with a finger. They may also be finely crinkled and have a scaly ring around the edge.  The rash does not typically appear on areas of the skin that are exposed to the sun.  Most people who have this condition do not have other symptoms, but some have mild itching. In a few cases, a mild headache or body aches may occur before the rash appears and then go away. How is this diagnosed? Your health care provider may diagnose this condition by doing a physical exam and taking your medical history. To rule out other possible causes for the rash, the health care provider may order blood tests or take a skin sample from the rash to be looked at under a microscope. How  is this treated? Usually, treatment is not needed for this condition. The rash will probably go away on its own in 4-8 weeks. In some cases, a health care provider may recommend or prescribe medicine to reduce itching. Follow these instructions at home:  Take medicines only as directed by your health care provider.  Avoid scratching the affected areas of skin.  Do not take hot baths or use a sauna. Use only warm water when bathing or showering. Heat can increase itching. Contact a health care provider if:  Your rash does not go away in 8 weeks.  Your rash gets much worse.  You have a fever.  You have swelling or pain in the rash area.  You have fluid, blood, or pus coming from the rash area. This information is not intended to replace advice given to you by your health care provider. Make sure you discuss any questions you have with your health care provider. Document Released: 06/21/2001 Document Revised: 10/21/2015 Document Reviewed: 04/22/2014 Elsevier Interactive Patient Education  2018 [employer] Inc[employer]  

## 2017-01-02 NOTE — Progress Notes (Signed)
Monica Chase, is a 41 y.o. female  SWH:[id number]  YKZ:[id number]  DOB - [birth year]  Subjective:  Chief Complaint and HPI: Monica Chase is a 41 y.o. female here today After being seen in the ED 12/21/2016 for a suspected allergic reaction to seasoning on chicken that caused rash above her eyes.  No anaphylaxis.  He is here today for follow-up.  She was treated with prednisone and benadryl which seemed to help some.  She now has a pruritic rash on B arms, upper legs, abdomen, and trunk.  This all started out with 1 patch of dry itchy skin between her breasts about 10 days ago then started to spread all over.  She denies f/c. Wants to schedule a pap for after her kids go back to school.    ROS:   Constitutional:  No f/c, No night sweats, No unexplained weight loss. EENT:  No vision changes, No blurry vision, No hearing changes. No mouth, throat, or ear problems.  Respiratory: No cough, No SOB Cardiac: No CP, no palpitations GI:  No abd pain, No N/V/D. GU: No Urinary s/sx Musculoskeletal: No joint pain Neuro: No headache, no dizziness, no motor weakness.  Skin: + rash Endocrine:  No polydipsia. No polyuria.  Psych: Denies SI/HI  No problems updated.  ALLERGIES: Allergies  Allergen Reactions  . Penicillins Other (See Comments)    Reaction unknown  . Azithromycin Rash  . Bacitracin-Lidocaine [Lidocaine-Bacitracin] Rash    PAST MEDICAL HISTORY: Past Medical History:  Diagnosis Date  . ANXIETY 07/26/2006  . ASTHMA, INTERMITTENT 07/26/2006  . CONSTIPATION, CHRONIC 06/22/2010  . DEPRESSION, MAJOR, RECURRENT 07/26/2006  . Dizziness 11/09/2010  . Fibroid   . HYPERGLYCEMIA 09/28/2009  . HYPERTHYROIDISM 07/20/2009  . OBESITY 01/31/2008  . PTSD (post-traumatic stress disorder)     MEDICATIONS AT HOME: Prior to Admission medications   Medication Sig Start Date End Date Taking? Authorizing Provider  albuterol (PROVENTIL HFA;VENTOLIN HFA) 108 (90 Base) MCG/ACT inhaler Inhale 2  puffs into the lungs every 4 (four) hours as needed for wheezing or shortness of breath. 03/09/16  Yes Tresa Garter, MD  calamine lotion Apply 1 application topically as needed for itching. 10/10/16  Yes Hairston, Maylon Peppers, FNP  cetirizine (ZYRTEC) 10 MG tablet Take 1 tablet (10 mg total) by mouth daily. 01/02/17  Yes Freeman Caldron M, PA-C  colloidal oatmeal PACK Apply 1 each topically daily. For 14 days. 10/10/16  Yes Alfonse Spruce, FNP  diphenhydrAMINE (BENADRYL) 25 mg capsule Take 1 capsule (25 mg total) by mouth every 6 (six) hours as needed. 12/21/16  Yes Shary Decamp, PA-C  ferrous sulfate 325 (65 FE) MG tablet Take 325 mg by mouth daily with breakfast.   Yes [provider]  fexofenadine-pseudoephedrine (ALLEGRA-D ALLERGY & CONGESTION) 180-240 MG 24 hr tablet Take 1 tablet by mouth daily. 10/10/16  Yes Hairston, Maylon Peppers, FNP  fluticasone (FLONASE) 50 MCG/ACT nasal spray Place 2 sprays into both nostrils daily. 10/05/16  Yes McClung, Levada Dy M, PA-C  midodrine (PROAMATINE) 5 MG tablet TAKE 1 TABLET BY MOUTH THREE TIMES A DAY WITH MEALS 11/08/16  Yes Jegede, Olugbemiga E, MD  Olopatadine HCl (PAZEO) 0.7 % SOLN Apply 1 drop to eye daily. 08/23/16  Yes Jegede, Marlena Clipper, MD  phenylephrine (NEO-SYNEPHRINE) 1 % nasal spray Place 1 drop into both nostrils every 6 (six) hours as needed for congestion (Do not use more than 5 days). 07/07/16  Yes Clent Demark, PA-C  propylthiouracil (PTU) 50 MG tablet Take  1 tablet (50 mg total) by mouth daily. 05/01/16  Yes Tresa Garter, MD  sodium chloride (OCEAN) 0.65 % SOLN nasal spray Place 1 spray into both nostrils as needed for congestion. 07/07/16  Yes Clent Demark, PA-C  triamcinolone cream (KENALOG) 0.1 % Apply 1 application topically 2 (two) times daily. 01/02/17  Yes Argentina Donovan, PA-C  Cholecalciferol (VITAMIN D PO) Take by mouth.    [provider]  triamcinolone (NASACORT AQ) 55 MCG/ACT AERO nasal inhaler  Place 2 sprays into the nose daily. Patient not taking: Reported on 04/18/2016 02/20/16   Sherlene Shams, MD     Objective:  EXAM:   Vitals:   01/02/17 0938  BP: 116/77  Pulse: 66  Resp: 18  Temp: 97.8 F (36.6 C)  TempSrc: Oral  SpO2: 98%  Weight: 200 lb (90.7 kg)  Height: 5\' 11"  (1.803 m)    General appearance : A&OX3. NAD. Non-toxic-appearing HEENT: Atraumatic and Normocephalic.  PERRLA. EOM intact.  TM clear B. Mouth-MMM, post pharynx WNL w/o erythema, No PND. Neck: supple, no JVD. No cervical lymphadenopathy. No thyromegaly Chest/Lungs:  Breathing-non-labored, Good air entry bilaterally, breath sounds normal without rales, rhonchi, or wheezing  CVS: S1 S2 regular, no murmurs, gallops, rubs  Abdomen: Bowel sounds present, Non tender and not distended with no gaurding, rigidity or rebound. Extremities: Bilateral Lower Ext shows no edema, both legs are warm to touch with = pulse throughout Neurology:  CN II-XII grossly intact, Non focal.   Psych:  TP linear. J/I WNL. Normal speech. Appropriate eye contact and affect.  Skin:  Dry, patchy, hyperpigmented areas with obvious "herald patch" bt breasts.  +christmas tree distribution on arms, legs, chest/abdomen, trunk.  No secondary infection  Data Review Lab Results  Component Value Date   HGBA1C 4.90 07/12/2015   HGBA1C 5.0 11/09/2010     Assessment & Plan   1. Pityriasis rosea - triamcinolone cream (KENALOG) 0.1 %; Apply 1 application topically 2 (two) times daily.  Dispense: 30 g; Refill: 0 - cetirizine (ZYRTEC) 10 MG tablet; Take 1 tablet (10 mg total) by mouth daily.  Dispense: 30 tablet; Refill: 11   Patient have been counseled extensively about nutrition and exercise  Return in about 1 month (around 02/02/2017) for pap only appt with me.  The patient was given clear instructions to go to ER or return to medical center if symptoms don't improve, worsen or new problems develop. The patient verbalized understanding.  The patient was told to call to get lab results if they haven't heard anything in the next week.     Freeman Caldron, PA-C [facility] [place], [place]   01/02/2017, 10:02 AMPatient ID: [facility], female   DOB: 11/22/1975, 41 y.o.   MRN: [id number]

## 2017-01-16 ENCOUNTER — Ambulatory Visit
Admission: RE | Admit: 2017-01-16 | Discharge: 2017-01-16 | Disposition: A | Discharge status: Home / Self Care | Payer: Medicaid Other | Source: Ambulatory Visit | Attending: Internal Medicine | Admitting: Internal Medicine

## 2017-01-16 ENCOUNTER — Ambulatory Visit: Payer: Medicaid Other

## 2017-01-16 ENCOUNTER — Telehealth (INDEPENDENT_AMBULATORY_CARE_PROVIDER_SITE_OTHER): Payer: Self-pay | Admitting: *Deleted

## 2017-01-16 DIAGNOSIS — Z1239 Encounter for other screening for malignant neoplasm of breast: Secondary | ICD-10-CM

## 2017-01-16 NOTE — Telephone Encounter (Signed)
-----   Message from Tresa Garter, MD sent at 01/16/2017  4:52 PM EDT ----- Please inform patient that her screening mammogram shows no evidence of malignancy. Recommend screening mammogram at age 41.

## 2017-01-16 NOTE — Telephone Encounter (Signed)
Patient verified DOB Patient is aware of mammogram showing no evidence of malignancy and a recommended screening being completed at age 41. Patient expressed her understanding and had no further questions.

## 2017-01-26 ENCOUNTER — Other Ambulatory Visit: Payer: Self-pay | Admitting: Physician Assistant

## 2017-01-26 ENCOUNTER — Other Ambulatory Visit: Payer: Self-pay | Admitting: Internal Medicine

## 2017-01-26 DIAGNOSIS — I951 Orthostatic hypotension: Secondary | ICD-10-CM

## 2017-01-26 DIAGNOSIS — L42 Pityriasis rosea: Secondary | ICD-10-CM

## 2017-02-20 ENCOUNTER — Other Ambulatory Visit: Payer: Self-pay | Admitting: Internal Medicine

## 2017-02-20 DIAGNOSIS — I951 Orthostatic hypotension: Secondary | ICD-10-CM

## 2017-02-20 DIAGNOSIS — L42 Pityriasis rosea: Secondary | ICD-10-CM

## 2017-03-28 ENCOUNTER — Telehealth: Payer: Self-pay | Admitting: *Deleted

## 2017-04-09 NOTE — Telephone Encounter (Signed)
Patients letter was drafted and presented to the patient in office.

## 2017-05-16 ENCOUNTER — Ambulatory Visit: Payer: Self-pay | Admitting: Internal Medicine

## 2017-05-23 ENCOUNTER — Ambulatory Visit: Payer: Medicaid Other | Attending: Internal Medicine | Admitting: Physician Assistant

## 2017-05-23 VITALS — BP 115/67 | HR 65 | Temp 98.0°F | Resp 16 | Wt 211.8 lb

## 2017-05-23 DIAGNOSIS — Z79899 Other long term (current) drug therapy: Secondary | ICD-10-CM | POA: Diagnosis not present

## 2017-05-23 DIAGNOSIS — F329 Major depressive disorder, single episode, unspecified: Secondary | ICD-10-CM | POA: Diagnosis not present

## 2017-05-23 DIAGNOSIS — F4310 Post-traumatic stress disorder, unspecified: Secondary | ICD-10-CM | POA: Insufficient documentation

## 2017-05-23 DIAGNOSIS — Z0189 Encounter for other specified special examinations: Secondary | ICD-10-CM | POA: Diagnosis present

## 2017-05-23 DIAGNOSIS — J45909 Unspecified asthma, uncomplicated: Secondary | ICD-10-CM | POA: Insufficient documentation

## 2017-05-23 DIAGNOSIS — E0590 Thyrotoxicosis, unspecified without thyrotoxic crisis or storm: Secondary | ICD-10-CM | POA: Diagnosis not present

## 2017-05-23 DIAGNOSIS — E669 Obesity, unspecified: Secondary | ICD-10-CM | POA: Insufficient documentation

## 2017-05-23 DIAGNOSIS — E559 Vitamin D deficiency, unspecified: Secondary | ICD-10-CM | POA: Insufficient documentation

## 2017-05-23 DIAGNOSIS — Z880 Allergy status to penicillin: Secondary | ICD-10-CM | POA: Diagnosis not present

## 2017-05-23 NOTE — Progress Notes (Signed)
Patient ID: BEV DRENNEN, female   DOB: 04-03-1976, 41 y.o.   MRN: [id number]   Monica Chase, is a 41 y.o. female  AYT:[id number]  [id number]  DOB - [birth year]  Subjective:  Chief Complaint and HPI: Monica Chase is a 41 y.o. female here today to check her thryoid.  She is supposed to be taking PTU, but she is praying for healing and to not have to take it.  She says when her levels were checked in march, she was only taking the medication about twice a week.  She stopped taking the medication at all in October, so she hasn't taken any in weeks.  She denies s/sx of hyper/hypothyroid.     ROS:   Constitutional:  No f/c, No night sweats, No unexplained weight loss. EENT:  No vision changes, No blurry vision, No hearing changes. No mouth, throat, or ear problems.  Respiratory: No cough, No SOB Cardiac: No CP, no palpitations GI:  No abd pain, No N/V/D. GU: No Urinary s/sx Musculoskeletal: No joint pain Neuro: No headache, no dizziness, no motor weakness.  Skin: No rash Endocrine:  No polydipsia. No polyuria.  Psych: Denies SI/HI  No problems updated.  ALLERGIES: Allergies  Allergen Reactions  . Penicillins Other (See Comments)    Reaction unknown  . Azithromycin Rash  . Bacitracin-Lidocaine [Lidocaine-Bacitracin] Rash    PAST MEDICAL HISTORY: Past Medical History:  Diagnosis Date  . ANXIETY 07/26/2006  . ASTHMA, INTERMITTENT 07/26/2006  . CONSTIPATION, CHRONIC 06/22/2010  . DEPRESSION, MAJOR, RECURRENT 07/26/2006  . Dizziness 11/09/2010  . Fibroid   . HYPERGLYCEMIA 09/28/2009  . HYPERTHYROIDISM 07/20/2009  . OBESITY 01/31/2008  . PTSD (post-traumatic stress disorder)     MEDICATIONS AT HOME: Prior to Admission medications   Medication Sig Start Date End Date Taking? Authorizing Provider  aspirin EC 81 MG tablet Take 81 mg by mouth daily.   Yes [provider]  midodrine (PROAMATINE) 5 MG tablet TAKE 1 TABLET BY MOUTH THREE TIMES A DAY WITH MEALS  02/21/17  Yes Jegede, Olugbemiga E, MD  PROAIR HFA 108 (90 Base) MCG/ACT inhaler INHALE TWO PUFFS BY MOUTH EVERY 4 HOURS AS NEEDED FOR WHEEZING OR SHORTNESS OF BREATHING 02/20/17  Yes Tresa Garter, MD  calamine lotion Apply 1 application topically as needed for itching. Patient not taking: Reported on 05/23/2017 10/10/16   Alfonse Spruce, FNP  cetirizine (ZYRTEC) 10 MG tablet Take 1 tablet (10 mg total) by mouth daily. Patient not taking: Reported on 05/23/2017 01/02/17   Argentina Donovan, PA-C  Cholecalciferol (VITAMIN D PO) Take by mouth.    [provider]  colloidal oatmeal PACK Apply 1 each topically daily. For 14 days. Patient not taking: Reported on 05/23/2017 10/10/16   Alfonse Spruce, FNP  diphenhydrAMINE (BENADRYL) 25 mg capsule Take 1 capsule (25 mg total) by mouth every 6 (six) hours as needed. Patient not taking: Reported on 05/23/2017 12/21/16   Shary Decamp, PA-C  ferrous sulfate 325 (65 FE) MG tablet Take 325 mg by mouth daily with breakfast.    [provider]  fexofenadine-pseudoephedrine (ALLEGRA-D ALLERGY & CONGESTION) 180-240 MG 24 hr tablet Take 1 tablet by mouth daily. Patient not taking: Reported on 05/23/2017 10/10/16   Alfonse Spruce, FNP  fluticasone (FLONASE) 50 MCG/ACT nasal spray Place 2 sprays into both nostrils daily. Patient not taking: Reported on 05/23/2017 10/05/16   Argentina Donovan, PA-C  Olopatadine HCl (PAZEO) 0.7 % SOLN Apply 1 drop to eye daily. Patient  not taking: Reported on 05/23/2017 08/23/16   Tresa Garter, MD  phenylephrine (NEO-SYNEPHRINE) 1 % nasal spray Place 1 drop into both nostrils every 6 (six) hours as needed for congestion (Do not use more than 5 days). Patient not taking: Reported on 05/23/2017 07/07/16   Clent Demark, PA-C  propylthiouracil (PTU) 50 MG tablet Take 1 tablet (50 mg total) by mouth daily. Patient not taking: Reported on 05/23/2017 05/01/16   Tresa Garter, MD  sodium  chloride (OCEAN) 0.65 % SOLN nasal spray Place 1 spray into both nostrils as needed for congestion. Patient not taking: Reported on 05/23/2017 07/07/16   Clent Demark, PA-C  triamcinolone (NASACORT AQ) 55 MCG/ACT AERO nasal inhaler Place 2 sprays into the nose daily. Patient not taking: Reported on 04/18/2016 02/20/16   Wynona Luna, MD  triamcinolone cream (KENALOG) 0.1 % APPLY 1 APPLICATION TOPICALLY TWO TIMES DAILY. Patient not taking: Reported on 05/23/2017 02/20/17   Tresa Garter, MD     Objective:  EXAM:   Vitals:   05/23/17 1028  BP: 115/67  Pulse: 65  Resp: 16  Temp: 98 F (36.7 C)  TempSrc: Oral  SpO2: 97%  Weight: 211 lb 12.8 oz (96.1 kg)    General appearance : A&OX3. NAD. Non-toxic-appearing HEENT: Atraumatic and Normocephalic.  PERRLA. EOM intact.  Neck: supple, no JVD. No cervical lymphadenopathy. No thyromegaly Chest/Lungs:  Breathing-non-labored, Good air entry bilaterally, breath sounds normal without rales, rhonchi, or wheezing  CVS: S1 S2 regular, no murmurs, gallops, rubs  Extremities: Bilateral Lower Ext shows no edema, both legs are warm to touch with = pulse throughout Neurology:  CN II-XII grossly intact, Non focal.   Psych:  TP linear. J/I WNL. Normal speech. Appropriate eye contact and affect.  Skin:  No Rash  Data Review Lab Results  Component Value Date   HGBA1C 4.90 07/12/2015   HGBA1C 5.0 11/09/2010     Assessment & Plan   1. Vitamin D deficiency Not taking any supplement.  - Vitamin D, 25-hydroxy  2. Hyperthyroidism Prescribed PTU but hasn't taken since October- - Thyroid Panel With TSH - Basic metabolic panel I have urged her to continue medications if the labs show the need for it.  I have provided her education on hyperthryoidism and vitamin D in addition to counseling her on both.       Patient have been counseled extensively about nutrition and exercise  Return in about 6 months (around 11/21/2017) for Dr  Jegede-hyperthyroid/vitamin D deficiency.  The patient was given clear instructions to go to ER or return to medical center if symptoms don't improve, worsen or new problems develop. The patient verbalized understanding. The patient was told to call to get lab results if they haven't heard anything in the next week.     Freeman Caldron, PA-C [facility] [place], [place]   05/23/2017, 10:43 AM

## 2017-05-23 NOTE — Patient Instructions (Signed)
Vitamin D Deficiency Vitamin D deficiency is when your body does not have enough vitamin D. Vitamin D is important because:  It helps your body use other minerals that your body needs.  It helps keep your bones strong and healthy.  It may help to prevent some diseases.  It helps your heart and other muscles work well.  You can get vitamin D by:  Eating foods with vitamin D in them.  Drinking or eating milk or other foods that have had vitamin D added to them.  Taking a vitamin D supplement.  Being in the sun.  Not getting enough vitamin D can make your bones become soft. It can also cause other health problems. Follow these instructions at home:  Take medicines and supplements only as told by your doctor.  Eat foods that have vitamin D. These include: ? Dairy products, cereals, or juices with added vitamin D. Check the label for vitamin D. ? Fatty fish like salmon or trout. ? Eggs. ? Oysters.  Do not use tanning beds.  Stay at a healthy weight. Lose weight, if needed.  Keep all follow-up visits as told by your doctor. This is important. Contact a doctor if:  Your symptoms do not go away.  You feel sick to your stomach (nauseous).  Youthrow up (vomit).  You poop less often than usual or you have trouble pooping (constipation). This information is not intended to replace advice given to you by your health care provider. Make sure you discuss any questions you have with your health care provider. Document Released: 05/04/2011 Document Revised: 10/21/2015 Document Reviewed: 09/30/2014 Elsevier Interactive Patient Education  2018 [employer]. Hyperthyroidism Hyperthyroidism is when the thyroid is too active (overactive). Your thyroid is a large gland that is located in your neck. The thyroid helps to control how your body uses food (metabolism). When your thyroid is overactive, it produces too much of a hormone called thyroxine. What are the causes? Causes of  hyperthyroidism may include:  Graves disease. This is when your immune system attacks the thyroid gland. This is the most common cause.  Inflammation of the thyroid gland.  Tumor in the thyroid gland or somewhere else.  Excessive use of thyroid medicines, including: ? Prescription thyroid supplement. ? Herbal supplements that mimic thyroid hormones.  Solid or fluid-filled lumps within your thyroid gland (thyroid nodules).  Excessive ingestion of iodine.  What increases the risk?  Being female.  Having a family history of thyroid conditions. What are the signs or symptoms? Signs and symptoms of hyperthyroidism may include:  Nervousness.  Inability to tolerate heat.  Unexplained weight loss.  Diarrhea.  Change in the texture of hair or skin.  Heart skipping beats or making extra beats.  Rapid heart rate.  Loss of menstruation.  Shaky hands.  Fatigue.  Restlessness.  Increased appetite.  Sleep problems.  Enlarged thyroid gland or nodules.  How is this diagnosed? Diagnosis of hyperthyroidism may include:  Medical history and physical exam.  Blood tests.  Ultrasound tests.  How is this treated? Treatment may include:  Medicines to control your thyroid.  Surgery to remove your thyroid.  Radiation therapy.  Follow these instructions at home:  Take medicines only as directed by your health care provider.  Do not use any tobacco products, including cigarettes, chewing tobacco, or electronic cigarettes. If you need help quitting, ask your health care provider.  Do not exercise or do physical activity until your health care provider approves.  Keep all follow-up appointments  as directed by your health care provider. This is important. Contact a health care provider if:  Your symptoms do not get better with treatment.  You have fever.  You are taking thyroid replacement medicine and you: ? Have depression. ? Feel mentally and physically  slow. ? Have weight gain. Get help right away if:  You have decreased alertness or a change in your awareness.  You have abdominal pain.  You feel dizzy.  You have a rapid heartbeat.  You have an irregular heartbeat. This information is not intended to replace advice given to you by your health care provider. Make sure you discuss any questions you have with your health care provider. Document Released: 05/15/2005 Document Revised: 10/14/2015 Document Reviewed: 09/30/2013 Elsevier Interactive Patient Education  2018 [employer].

## 2017-05-24 LAB — BASIC METABOLIC PANEL
BUN/Creatinine Ratio: 16 (ref 9–23)
BUN: 10 mg/dL (ref 6–20)
CALCIUM: 9.3 mg/dL (ref 8.7–10.2)
CO2: 24 mmol/L (ref 20–29)
CREATININE: 0.63 mg/dL (ref 0.57–1.00)
Chloride: 102 mmol/L (ref 96–106)
GFR, EST AFRICAN AMERICAN: 131 mL/min/{1.73_m2} (ref >59)
GFR, EST NON AFRICAN AMERICAN: 113 mL/min/{1.73_m2} (ref >59)
Glucose: 70 mg/dL (ref 65–99)
POTASSIUM: 4.4 mmol/L (ref 3.5–5.2)
Sodium: 140 mmol/L (ref 134–144)

## 2017-05-24 LAB — THYROID PANEL WITH TSH
Free Thyroxine Index: 1.4 (ref 1.2–4.9)
T3 UPTAKE RATIO: 24 % (ref 24–39)
T4, Total: 5.9 ug/dL (ref 4.5–12.0)
TSH: 1.42 u[IU]/mL (ref 0.450–4.500)

## 2017-05-24 LAB — VITAMIN D 25 HYDROXY (VIT D DEFICIENCY, FRACTURES): VIT D 25 HYDROXY: 26.1 ng/mL — AB (ref 30.0–100.0)

## 2017-05-28 ENCOUNTER — Telehealth: Payer: Self-pay

## 2017-05-28 NOTE — Telephone Encounter (Signed)
Contacted pt to go over lab results left a detailed message regarding results and if she has any questions or concerns to give [place] a call  If pt calls back and want results again please give results: Thyroid function is normal for now. Lets keep a close check on this and recheck it in 2 months. Your vitamin D is still a little low. You can take Over the counter vitamin D 2000 units daily for this. There is no evidence of diabetes. Your kidney function and electrolytes are all normal. Follow up in 2-3 months-sooner if any problems

## 2017-06-20 ENCOUNTER — Encounter: Payer: Self-pay | Admitting: Internal Medicine

## 2017-06-20 ENCOUNTER — Ambulatory Visit: Payer: Medicaid Other | Attending: Internal Medicine | Admitting: Internal Medicine

## 2017-06-20 VITALS — BP 108/68 | HR 67 | Temp 97.6°F | Resp 16 | Ht 70.0 in | Wt 211.6 lb

## 2017-06-20 DIAGNOSIS — K029 Dental caries, unspecified: Secondary | ICD-10-CM | POA: Insufficient documentation

## 2017-06-20 DIAGNOSIS — Z880 Allergy status to penicillin: Secondary | ICD-10-CM | POA: Diagnosis not present

## 2017-06-20 DIAGNOSIS — J069 Acute upper respiratory infection, unspecified: Secondary | ICD-10-CM | POA: Diagnosis not present

## 2017-06-20 DIAGNOSIS — Z87891 Personal history of nicotine dependence: Secondary | ICD-10-CM | POA: Diagnosis not present

## 2017-06-20 DIAGNOSIS — Z9889 Other specified postprocedural states: Secondary | ICD-10-CM | POA: Diagnosis not present

## 2017-06-20 DIAGNOSIS — H8110 Benign paroxysmal vertigo, unspecified ear: Secondary | ICD-10-CM | POA: Diagnosis not present

## 2017-06-20 DIAGNOSIS — J45909 Unspecified asthma, uncomplicated: Secondary | ICD-10-CM | POA: Insufficient documentation

## 2017-06-20 DIAGNOSIS — Z90710 Acquired absence of both cervix and uterus: Secondary | ICD-10-CM | POA: Insufficient documentation

## 2017-06-20 DIAGNOSIS — E0590 Thyrotoxicosis, unspecified without thyrotoxic crisis or storm: Secondary | ICD-10-CM | POA: Diagnosis not present

## 2017-06-20 DIAGNOSIS — Z79899 Other long term (current) drug therapy: Secondary | ICD-10-CM | POA: Diagnosis not present

## 2017-06-20 DIAGNOSIS — Z6830 Body mass index (BMI) 30.0-30.9, adult: Secondary | ICD-10-CM | POA: Diagnosis not present

## 2017-06-20 DIAGNOSIS — E559 Vitamin D deficiency, unspecified: Secondary | ICD-10-CM | POA: Diagnosis not present

## 2017-06-20 DIAGNOSIS — Z881 Allergy status to other antibiotic agents status: Secondary | ICD-10-CM | POA: Diagnosis not present

## 2017-06-20 DIAGNOSIS — Z90722 Acquired absence of ovaries, bilateral: Secondary | ICD-10-CM | POA: Insufficient documentation

## 2017-06-20 DIAGNOSIS — E7800 Pure hypercholesterolemia, unspecified: Secondary | ICD-10-CM | POA: Diagnosis not present

## 2017-06-20 DIAGNOSIS — E669 Obesity, unspecified: Secondary | ICD-10-CM | POA: Diagnosis not present

## 2017-06-20 MED ORDER — GUAIFENESIN-DM 100-10 MG/5ML PO SYRP: 10.0000 mL | ORAL_SOLUTION | ORAL | 0 refills | Status: DC | PRN

## 2017-06-20 MED ORDER — DOXYCYCLINE HYCLATE 100 MG PO TABS: 100.0000 mg | ORAL_TABLET | Freq: Two times a day (BID) | ORAL | 0 refills | Status: AC

## 2017-06-20 NOTE — Patient Instructions (Signed)
Upper Respiratory Infection, Adult Most upper respiratory infections (URIs) are caused by a virus. A URI affects the nose, throat, and upper air passages. The most common type of URI is often called "the common cold." Follow these instructions at home:  Take medicines only as told by your doctor.  Gargle warm saltwater or take cough drops to comfort your throat as told by your doctor.  Use a warm mist humidifier or inhale steam from a shower to increase air moisture. This may make it easier to breathe.  Drink enough fluid to keep your pee (urine) clear or pale yellow.  Eat soups and other clear broths.  Have a healthy diet.  Rest as needed.  Go back to work when your fever is gone or your doctor says it is okay. ? You may need to stay home longer to avoid giving your URI to others. ? You can also wear a face mask and wash your hands often to prevent spread of the virus.  Use your inhaler more if you have asthma.  Do not use any tobacco products, including cigarettes, chewing tobacco, or electronic cigarettes. If you need help quitting, ask your doctor. Contact a doctor if:  You are getting worse, not better.  Your symptoms are not helped by medicine.  You have chills.  You are getting more short of breath.  You have brown or red mucus.  You have yellow or brown discharge from your nose.  You have pain in your face, especially when you bend forward.  You have a fever.  You have puffy (swollen) neck glands.  You have pain while swallowing.  You have white areas in the back of your throat. Get help right away if:  You have very bad or constant: ? Headache. ? Ear pain. ? Pain in your forehead, behind your eyes, and over your cheekbones (sinus pain). ? Chest pain.  You have long-lasting (chronic) lung disease and any of the following: ? Wheezing. ? Long-lasting cough. ? Coughing up blood. ? A change in your usual mucus.  You have a stiff neck.  You have  changes in your: ? Vision. ? Hearing. ? Thinking. ? Mood. This information is not intended to replace advice given to you by your health care provider. Make sure you discuss any questions you have with your health care provider. Document Released: 11/01/2007 Document Revised: 01/16/2016 Document Reviewed: 08/20/2013 Elsevier Interactive Patient Education  2018 [employer] Inc.  

## 2017-06-20 NOTE — Progress Notes (Signed)
Concerns for sinus inf/ URI

## 2017-06-20 NOTE — Progress Notes (Signed)
Subjective:  Patient ID: Monica Chase, female    DOB: May 30, 1975  Age: 42 y.o. MRN: [id number]  CC: Upper Respiratory Infection  HPI Monica Chase is a 42 y/o [place] American female with medical history of hyperthyroidism, intermittent asthma, PTSD, depression, and recurrent maxillary sinusitis. She presents today with c/o cold-like symptoms.  URI: Patient c/o cough with yellow sputum, runny nose, sinus pressure, headache, and increased fatigue for the duration of 2 weeks. Reports intermittent dizziness from sinus pressure and nasal congestion. Reports slight puffiness under her eyes and dry skin under her eyes, bilaterally. Denies night sweats or fevers at home; however, reports intermittent chills for the last 3-4 days. Reports resolved sore throat from over the counter Bayer Aspirin 325 mg. Patient has had recent sick contact and flu contact. Denies chest pain, sob, or chest tightness. Denies loss of appetite.   Past Medical History:  Diagnosis Date  . ANXIETY 07/26/2006  . ASTHMA, INTERMITTENT 07/26/2006  . CONSTIPATION, CHRONIC 06/22/2010  . DEPRESSION, MAJOR, RECURRENT 07/26/2006  . Dizziness 11/09/2010  . Fibroid   . HYPERGLYCEMIA 09/28/2009  . HYPERTHYROIDISM 07/20/2009  . OBESITY 01/31/2008  . PTSD (post-traumatic stress disorder)    Patient Active Problem List   Diagnosis Date Noted  . Acute recurrent maxillary sinusitis 08/23/2016  . Eye discharge 08/23/2016  . 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 Muscle cramps 01/20/2016  . Vitamin D deficiency 08/27/2015  . Chronic midline low back pain without sciatica 08/26/2015  . Orthostatic hypotension 02/16/2015  . Near syncope 02/05/2015  . Headache 02/05/2015  . Atypical chest pain 08/05/2014  . Herpes zoster 12/01/2013  . Benign paroxysmal positional vertigo 11/13/2013  . Hemorrhoid 04/16/2013  . Hypercholesterolemia 08/05/2012  . Dental caries 03/19/2012  . Dizziness 11/09/2010  . CONSTIPATION, CHRONIC 06/22/2010    . Hyperthyroidism 07/20/2009  . Obesity 01/31/2008  . Episodic mood disorder ([place]) 07/26/2006  . Anxiety state 07/26/2006  . ASTHMA, INTERMITTENT 07/26/2006   Past Surgical History:  Procedure Laterality Date  . BILATERAL SALPINGECTOMY Bilateral 07/07/2013   Procedure: BILATERAL SALPINGECTOMY;  Surgeon: Donnamae Jude, MD;  Location: [facility];  Service: Gynecology;  Laterality: Bilateral;  . IUD REMOVAL  10/07   Mirena removed 05/10/2004  . PILONIDAL CYST EXCISION    . TUBAL LIGATION  2008  . VAGINAL DELIVERY     X 4  . VAGINAL HYSTERECTOMY N/A 07/07/2013   Procedure: HYSTERECTOMY VAGINAL;  Surgeon: Donnamae Jude, MD;  Location: [facility];  Service: Gynecology;  Laterality: N/A;   Social History   Socioeconomic History  . Marital status: Divorced    Spouse name: Not on file  . Number of children: 4  . Years of education: some coll.  . Highest education level: Not on file  Social Needs  . Financial resource strain: Not on file  . Food insecurity - worry: Not on file  . Food insecurity - inability: Not on file  . Transportation needs - medical: Not on file  . Transportation needs - non-medical: Not on file  Occupational History  . Occupation: N/A  Tobacco Use  . Smoking status: Former Smoker    Last attempt to quit: 11/08/2005    Years since quitting: 11.6  . Smokeless tobacco: Never Used  Substance and Sexual Activity  . Alcohol use: No    Comment: Quit 2009  . Drug use: No    Comment: Quit 2009  . Sexual activity: Not on file  Other Topics Concern  . Not on  file  Social History Narrative   Single, lives at home w/ daughter   Patient drinks caffeine rarely.   Patient is right handed.    Outpatient Medications Prior to Visit  Medication Sig Dispense Refill  . aspirin EC 81 MG tablet Take 81 mg by mouth daily.    . calamine lotion Apply 1 application topically as needed for itching. (Patient not taking: Reported on 05/23/2017) 120 mL 0  . cetirizine (ZYRTEC) 10 MG tablet  Take 1 tablet (10 mg total) by mouth daily. (Patient not taking: Reported on 05/23/2017) 30 tablet 11  . Cholecalciferol (VITAMIN D PO) Take by mouth.    . colloidal oatmeal PACK Apply 1 each topically daily. For 14 days. (Patient not taking: Reported on 05/23/2017)    . diphenhydrAMINE (BENADRYL) 25 mg capsule Take 1 capsule (25 mg total) by mouth every 6 (six) hours as needed. (Patient not taking: Reported on 05/23/2017) 30 capsule 0  . ferrous sulfate 325 (65 FE) MG tablet Take 325 mg by mouth daily with breakfast.    . fexofenadine-pseudoephedrine (ALLEGRA-D ALLERGY & CONGESTION) 180-240 MG 24 hr tablet Take 1 tablet by mouth daily. (Patient not taking: Reported on 05/23/2017) 24 tablet 0  . fluticasone (FLONASE) 50 MCG/ACT nasal spray Place 2 sprays into both nostrils daily. (Patient not taking: Reported on 05/23/2017) 16 g 6  . midodrine (PROAMATINE) 5 MG tablet TAKE 1 TABLET BY MOUTH THREE TIMES A DAY WITH MEALS 90 tablet 1  . Olopatadine HCl (PAZEO) 0.7 % SOLN Apply 1 drop to eye daily. (Patient not taking: Reported on 05/23/2017) 5 mL 2  . phenylephrine (NEO-SYNEPHRINE) 1 % nasal spray Place 1 drop into both nostrils every 6 (six) hours as needed for congestion (Do not use more than 5 days). (Patient not taking: Reported on 05/23/2017) 30 mL 0  . PROAIR HFA 108 (90 Base) MCG/ACT inhaler INHALE TWO PUFFS BY MOUTH EVERY 4 HOURS AS NEEDED FOR WHEEZING OR SHORTNESS OF BREATHING 8.5 Inhaler 2  . propylthiouracil (PTU) 50 MG tablet Take 1 tablet (50 mg total) by mouth daily. (Patient not taking: Reported on 05/23/2017) 90 tablet 3  . sodium chloride (OCEAN) 0.65 % SOLN nasal spray Place 1 spray into both nostrils as needed for congestion. (Patient not taking: Reported on 05/23/2017) 1 Bottle 0  . triamcinolone (NASACORT AQ) 55 MCG/ACT AERO nasal inhaler Place 2 sprays into the nose daily. (Patient not taking: Reported on 04/18/2016) 1 Inhaler 0  . triamcinolone cream (KENALOG) 0.1 % APPLY 1  APPLICATION TOPICALLY TWO TIMES DAILY. (Patient not taking: Reported on 05/23/2017) 30 g 0   No facility-administered medications prior to visit.    Allergies  Allergen Reactions  . Penicillins Other (See Comments)    Reaction unknown  . Azithromycin Rash  . Bacitracin-Lidocaine [Lidocaine-Bacitracin] Rash   ROS Review of Systems  Constitutional: Positive for activity change, chills and fatigue. Negative for appetite change, diaphoresis and fever.  HENT: Positive for congestion, facial swelling, postnasal drip, rhinorrhea, sinus pressure, sinus pain and sore throat. Negative for ear discharge, ear pain, trouble swallowing and voice change.   Eyes: Positive for discharge. Negative for photophobia, pain, itching and visual disturbance.  Respiratory: Positive for cough. Negative for chest tightness, shortness of breath and wheezing.   Cardiovascular: Negative for chest pain and palpitations.  Gastrointestinal: Negative for abdominal pain, nausea and vomiting.  Musculoskeletal: Positive for myalgias. Negative for arthralgias, back pain and neck pain.  Skin: Negative for color change and rash.  Neurological:  Positive for dizziness and headaches. Negative for syncope, weakness, light-headedness and numbness.  Psychiatric/Behavioral: Negative for dysphoric mood and sleep disturbance.  All other systems reviewed and are negative.  Objective:  BP 108/68 (BP Location: Right Arm, Patient Position: Sitting, Cuff Size: Large)   Pulse 67   Temp 97.6 F (36.4 C) (Oral)   Resp 16   Ht 5\' 10"  (1.778 m)   Wt 211 lb 9.6 oz (96 kg)   LMP 06/18/2013   SpO2 98%   BMI 30.36 kg/m   BP/Weight 06/20/2017 24/01/7352 07/07/9240  Systolic BP 683 419 622  Diastolic BP 68 67 77  Wt. (Lbs) 211.6 211.8 200  BMI 30.36 29.54 27.89   Physical Exam  Constitutional: She is oriented to person, place, and time. She appears well-developed and well-nourished. No distress.  HENT:  Head: Normocephalic and  atraumatic.  Right Ear: Tympanic membrane, external ear and ear canal normal.  Left Ear: Tympanic membrane, external ear and ear canal normal.  Nose: Rhinorrhea present. No mucosal edema.  Mouth/Throat: Oropharynx is clear and moist and mucous membranes are normal. No oropharyngeal exudate, posterior oropharyngeal edema or posterior oropharyngeal erythema.  Eyes: Conjunctivae, EOM and lids are normal. Lids are everted and swept, no foreign bodies found. Right eye exhibits no discharge. Left eye exhibits no discharge.  Neck: Neck supple.  Cardiovascular: S1 normal and S2 normal. Exam reveals no gallop.  No murmur heard. Pulses:      Radial pulses are 2+ on the right side, and 2+ on the left side.  Pulmonary/Chest: Effort normal and breath sounds normal. No respiratory distress. She has no wheezes. She exhibits no tenderness.  Negative bronchophony and egophony  Abdominal: Soft. Bowel sounds are normal. She exhibits no distension. There is no tenderness.  Musculoskeletal: Normal range of motion. She exhibits no edema or tenderness.  Lymphadenopathy:    She has no cervical adenopathy.  Negative head adenopathy   Neurological: She is alert and oriented to person, place, and time.  Skin: Skin is warm and dry. No rash noted. No erythema.  Psychiatric: She has a normal mood and affect. Her behavior is normal. Judgment and thought content normal.  Nursing note and vitals reviewed.  Assessment & Plan:   1. Acute upper respiratory infection  - Patient with continued symptoms past 14 days.   - Prescribed Doxycyline 100 mg and Guaifenesin.  - Instructed to follow-up if symptoms do not improve.   Meds ordered this encounter  Medications  . doxycycline (VIBRA-TABS) 100 MG tablet    Sig: Take 1 tablet (100 mg total) by mouth 2 (two) times daily for 7 days.    Dispense:  14 tablet    Refill:  0  . guaiFENesin-dextromethorphan (ROBITUSSIN DM) 100-10 MG/5ML syrup    Sig: Take 10 mLs by mouth  every 4 (four) hours as needed for cough.    Dispense:  118 mL    Refill:  0   Follow-up: Return in about 6 months (around 12/18/2017) for Routine Follow Up.   Krystle H. Hulan Fray, DNP student   Evaluation and management procedures were performed by me with DNP Student in attendance, note written by DNP student under my supervision and collaboration. I have reviewed the note and I agree with the management and plan.   Angelica Chessman, MD, [facility], [place], [facility] and [facility] [place], [place]   06/26/2017, 6:22 PM

## 2017-07-20 ENCOUNTER — Other Ambulatory Visit: Payer: Self-pay | Admitting: Internal Medicine

## 2017-08-16 ENCOUNTER — Other Ambulatory Visit: Payer: Self-pay | Admitting: Internal Medicine

## 2017-08-16 DIAGNOSIS — I951 Orthostatic hypotension: Secondary | ICD-10-CM

## 2017-12-12 ENCOUNTER — Ambulatory Visit: Payer: Medicaid Other | Attending: Family Medicine | Admitting: Physician Assistant

## 2017-12-12 VITALS — BP 106/70 | HR 66 | Temp 98.1°F | Resp 18 | Ht 71.0 in | Wt 225.0 lb

## 2017-12-12 DIAGNOSIS — J0100 Acute maxillary sinusitis, unspecified: Secondary | ICD-10-CM | POA: Diagnosis not present

## 2017-12-12 DIAGNOSIS — Z880 Allergy status to penicillin: Secondary | ICD-10-CM | POA: Diagnosis not present

## 2017-12-12 DIAGNOSIS — Z881 Allergy status to other antibiotic agents status: Secondary | ICD-10-CM | POA: Insufficient documentation

## 2017-12-12 DIAGNOSIS — E669 Obesity, unspecified: Secondary | ICD-10-CM | POA: Diagnosis not present

## 2017-12-12 DIAGNOSIS — F329 Major depressive disorder, single episode, unspecified: Secondary | ICD-10-CM | POA: Insufficient documentation

## 2017-12-12 DIAGNOSIS — J45909 Unspecified asthma, uncomplicated: Secondary | ICD-10-CM | POA: Diagnosis not present

## 2017-12-12 DIAGNOSIS — F4310 Post-traumatic stress disorder, unspecified: Secondary | ICD-10-CM | POA: Insufficient documentation

## 2017-12-12 DIAGNOSIS — Z6831 Body mass index (BMI) 31.0-31.9, adult: Secondary | ICD-10-CM | POA: Diagnosis not present

## 2017-12-12 DIAGNOSIS — Z7982 Long term (current) use of aspirin: Secondary | ICD-10-CM | POA: Insufficient documentation

## 2017-12-12 DIAGNOSIS — J320 Chronic maxillary sinusitis: Secondary | ICD-10-CM | POA: Insufficient documentation

## 2017-12-12 DIAGNOSIS — Z79899 Other long term (current) drug therapy: Secondary | ICD-10-CM | POA: Diagnosis not present

## 2017-12-12 MED ORDER — AZELASTINE HCL 0.1 % NA SOLN: 2.0000 | Freq: Two times a day (BID) | NASAL | 12 refills | Status: AC

## 2017-12-12 MED ORDER — SULFAMETHOXAZOLE-TRIMETHOPRIM 800-160 MG PO TABS: 1.0000 | ORAL_TABLET | Freq: Two times a day (BID) | ORAL | 0 refills | Status: AC

## 2017-12-12 NOTE — Progress Notes (Signed)
Patient ID: Monica Chase, female   DOB: 02/16/1976, 42 y.o.   MRN: [id number]     Zoe Creasman, is a 42 y.o. female  HFW:[id number]  OYD:[id number]  DOB - 04-17-1976  Subjective:  Chief Complaint and HPI: Monica Chase is a 42 y.o. female here today with 2 month h/o sinus congestion, occasional cough, sometimes eyes crusting, yellow mucus.  Zyrtec, benadryl, and mucinex not helping.  No f/c.  +sinus pain and pressure.  flonase and nasacort have caused HA.    ROS:   Constitutional:  No f/c, No night sweats, No unexplained weight loss. EENT:  No vision changes, No blurry vision, No hearing changes. No other mouth, throat, or ear problems.  Respiratory: No cough, No SOB Cardiac: No CP, no palpitations GI:  No abd pain, No N/V/D. GU: No Urinary s/sx Musculoskeletal: No joint pain Neuro: +sinus headache, no dizziness, no motor weakness.  Skin: No rash Endocrine:  No polydipsia. No polyuria.  Psych: Denies SI/HI  No problems updated.  ALLERGIES: Allergies  Allergen Reactions  . Penicillins Other (See Comments)    Reaction unknown  . Azithromycin Rash  . Bacitracin-Lidocaine [Lidocaine-Bacitracin] Rash    PAST MEDICAL HISTORY: Past Medical History:  Diagnosis Date  . ANXIETY 07/26/2006  . ASTHMA, INTERMITTENT 07/26/2006  . CONSTIPATION, CHRONIC 06/22/2010  . DEPRESSION, MAJOR, RECURRENT 07/26/2006  . Dizziness 11/09/2010  . Fibroid   . HYPERGLYCEMIA 09/28/2009  . HYPERTHYROIDISM 07/20/2009  . OBESITY 01/31/2008  . PTSD (post-traumatic stress disorder)     MEDICATIONS AT HOME: Prior to Admission medications   Medication Sig Start Date End Date Taking? Authorizing Provider  aspirin EC 81 MG tablet Take 81 mg by mouth daily.   Yes [provider]  Cholecalciferol (VITAMIN D PO) Take by mouth.   Yes [provider]  ferrous sulfate 325 (65 FE) MG tablet Take 325 mg by mouth daily with breakfast.   Yes [provider]  midodrine  (PROAMATINE) 5 MG tablet TAKE 1 TABLET BY MOUTH THREE TIMES A DAY WITH MEALS 08/17/17  Yes Jegede, Olugbemiga E, MD  PROAIR HFA 108 (90 Base) MCG/ACT inhaler INHALE TWO PUFFS BY MOUTH EVERY 4 HOURS AS NEEDED FOR WHEEZING OR SHORTNESS OF BREATHING 02/20/17  Yes Jegede, Olugbemiga E, MD  propylthiouracil (PTU) 50 MG tablet TAKE ONE TABLET BY MOUTH DAILY 07/20/17  Yes Jegede, Olugbemiga E, MD  azelastine (ASTELIN) 0.1 % nasal spray Place 2 sprays into both nostrils 2 (two) times daily. Use in each nostril as directed 12/12/17   Argentina Donovan, PA-C  calamine lotion Apply 1 application topically as needed for itching. Patient not taking: Reported on 05/23/2017 10/10/16   Alfonse Spruce, FNP  cetirizine (ZYRTEC) 10 MG tablet Take 1 tablet (10 mg total) by mouth daily. Patient not taking: Reported on 05/23/2017 01/02/17   Argentina Donovan, PA-C  colloidal oatmeal PACK Apply 1 each topically daily. For 14 days. Patient not taking: Reported on 05/23/2017 10/10/16   Alfonse Spruce, FNP  Olopatadine HCl (PAZEO) 0.7 % SOLN Apply 1 drop to eye daily. Patient not taking: Reported on 05/23/2017 08/23/16   Tresa Garter, MD  sodium chloride (OCEAN) 0.65 % SOLN nasal spray Place 1 spray into both nostrils as needed for congestion. Patient not taking: Reported on 05/23/2017 07/07/16   Clent Demark, PA-C  sulfamethoxazole-trimethoprim (BACTRIM DS,SEPTRA DS) 800-160 MG tablet Take 1 tablet by mouth 2 (two) times daily. 12/12/17   Argentina Donovan, PA-C  triamcinolone cream (  KENALOG) 0.1 % APPLY 1 APPLICATION TOPICALLY TWO TIMES DAILY. Patient not taking: Reported on 05/23/2017 02/20/17   Tresa Garter, MD  levocetirizine (XYZAL) 5 MG tablet Take 1 tablet (5 mg total) by mouth every evening. Patient not taking: Reported on 10/13/2014 10/10/14 11/06/14  Lutricia Feil, PA     Objective:  EXAM:   Vitals:   12/12/17 0917  BP: 106/70  Pulse: 66  Resp: 18  Temp: 98.1 F (36.7 C)    TempSrc: Oral  SpO2: 99%  Weight: 225 lb (102.1 kg)  Height: 5\' 11"  (1.803 m)    General appearance : A&OX3. NAD. Non-toxic-appearing HEENT: Atraumatic and Normocephalic.  PERRLA. EOM intact.  TM clear B. Mouth-MMM, post pharynx WNL w/o erythema, + PND.  Turbinates inflamed and enlarged.  +maxillary TTP Neck: supple, no JVD. No cervical lymphadenopathy. No thyromegaly Chest/Lungs:  Breathing-non-labored, Good air entry bilaterally, breath sounds normal without rales, rhonchi, or wheezing  CVS: S1 S2 regular, no murmurs, gallops, rubs  Extremities: Bilateral Lower Ext shows no edema, both legs are warm to touch with = pulse throughout Neurology:  CN II-XII grossly intact, Non focal.   Psych:  TP linear. J/I WNL. Normal speech. Appropriate eye contact and affect.  Skin:  No Rash  Data Review Lab Results  Component Value Date   HGBA1C 4.90 07/12/2015   HGBA1C 5.0 11/09/2010     Assessment & Plan   1. Subacute maxillary sinusitis Fluids, rest - azelastine (ASTELIN) 0.1 % nasal spray; Place 2 sprays into both nostrils 2 (two) times daily. Use in each nostril as directed  Dispense: 30 mL; Refill: 12 - sulfamethoxazole-trimethoprim (BACTRIM DS,SEPTRA DS) 800-160 MG tablet; Take 1 tablet by mouth 2 (two) times daily.  Dispense: 20 tablet; Refill: 0   Patient have been counseled extensively about nutrition and exercise  Return in about 3 months (around 03/14/2018) for assign PCP.  The patient was given clear instructions to go to ER or return to medical center if symptoms don't improve, worsen or new problems develop. The patient verbalized understanding. The patient was told to call to get lab results if they haven't heard anything in the next week.     Freeman Caldron, PA-C [facility] and [facility] [place], [place]   12/12/2017, 9:33 AM

## 2017-12-12 NOTE — Patient Instructions (Signed)
mucinex D for 2 weeks for congestion   Sinusitis, Adult Sinusitis is soreness and inflammation of your sinuses. Sinuses are hollow spaces in the bones around your face. They are located:  Around your eyes.  In the middle of your forehead.  Behind your nose.  In your cheekbones.  Your sinuses and nasal passages are lined with a stringy fluid (mucus). Mucus normally drains out of your sinuses. When your nasal tissues get inflamed or swollen, the mucus can get trapped or blocked so air cannot flow through your sinuses. This lets bacteria, viruses, and funguses grow, and that leads to infection. Follow these instructions at home: Medicines  Take, use, or apply over-the-counter and prescription medicines only as told by your doctor. These may include nasal sprays.  If you were prescribed an antibiotic medicine, take it as told by your doctor. Do not stop taking the antibiotic even if you start to feel better. Hydrate and Humidify  Drink enough water to keep your pee (urine) clear or pale yellow.  Use a cool mist humidifier to keep the humidity level in your home above 50%.  Breathe in steam for 10-15 minutes, 3-4 times a day or as told by your doctor. You can do this in the bathroom while a hot shower is running.  Try not to spend time in cool or dry air. Rest  Rest as much as possible.  Sleep with your head raised (elevated).  Make sure to get enough sleep each night. General instructions  Put a warm, moist washcloth on your face 3-4 times a day or as told by your doctor. This will help with discomfort.  Wash your hands often with soap and water. If there is no soap and water, use hand sanitizer.  Do not smoke. Avoid being around people who are smoking (secondhand smoke).  Keep all follow-up visits as told by your doctor. This is important. Contact a doctor if:  You have a fever.  Your symptoms get worse.  Your symptoms do not get better within 10 days. Get help right  away if:  You have a very bad headache.  You cannot stop throwing up (vomiting).  You have pain or swelling around your face or eyes.  You have trouble seeing.  You feel confused.  Your neck is stiff.  You have trouble breathing. This information is not intended to replace advice given to you by your health care provider. Make sure you discuss any questions you have with your health care provider. Document Released: 11/01/2007 Document Revised: 01/09/2016 Document Reviewed: 03/10/2015 Elsevier Interactive Patient Education  [employer].

## 2018-04-19 IMAGING — MR MR LUMBAR SPINE W/O CM
4 of 5 series · 19 of 48 positions shown · non-contrast
Comparison: 09/06/2015

CLINICAL DATA: Low back pain that radiates down the right leg over
the past 3 years.

EXAM:
MRI LUMBAR SPINE WITHOUT CONTRAST
TECHNIQUE: Multiplanar, multisequence MR imaging of the lumbar spine was
performed. No intravenous contrast was administered.

[Series 2: T2 · sagittal · 4.0mm · 0.55mm/px · 6 of 12 slices shown (1 of 2)]
[im 1/12]
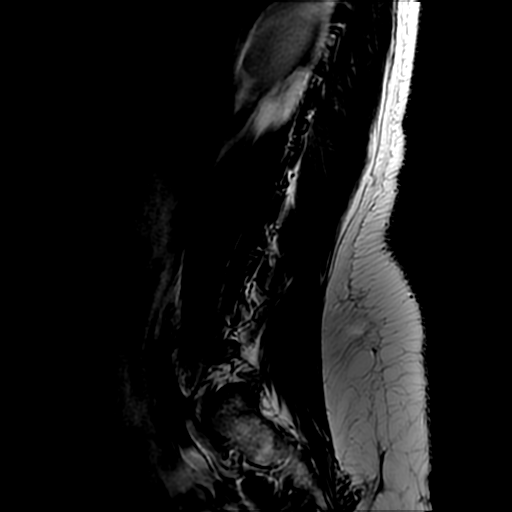
[im 3/12]
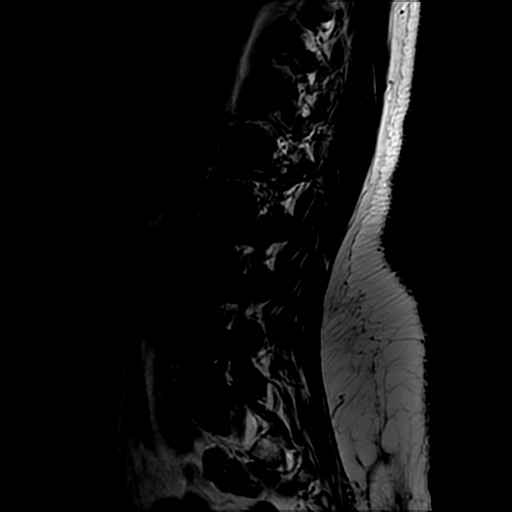
[im 5/12]
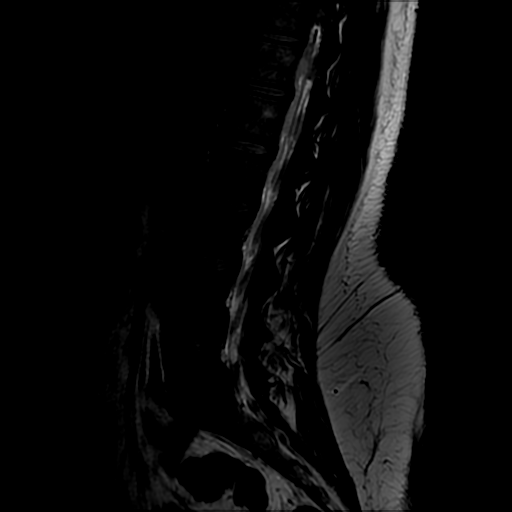
[im 7/12]
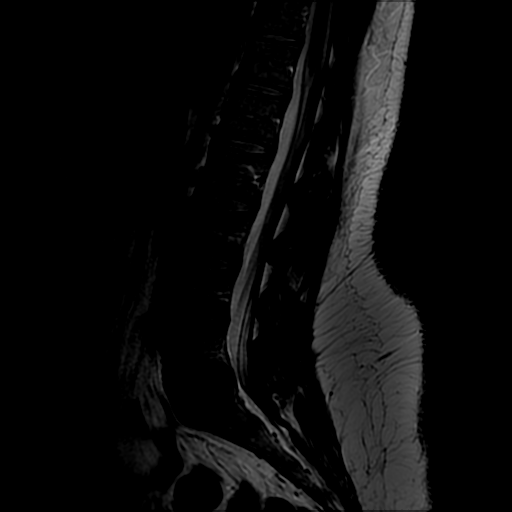
[im 9/12]
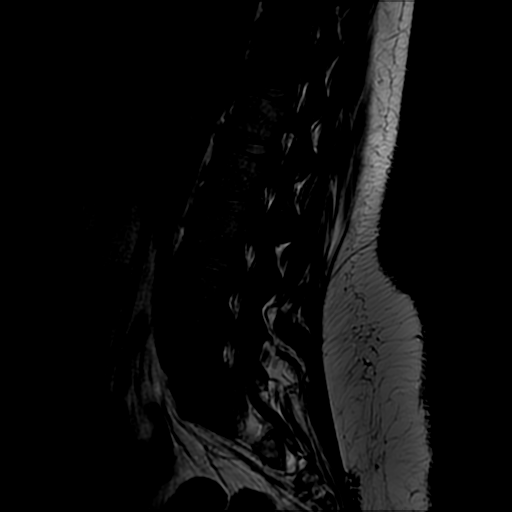
[im 12/12]
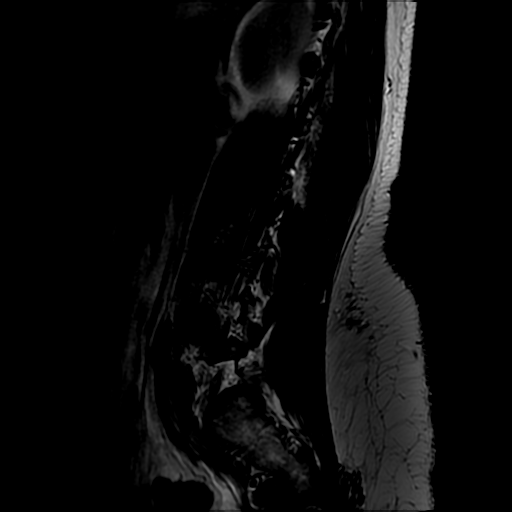

[Series 4: T1 · sagittal · 4.0mm · 0.55mm/px · 3 of 12 slices shown (1 of 2)]
[im 3/12]
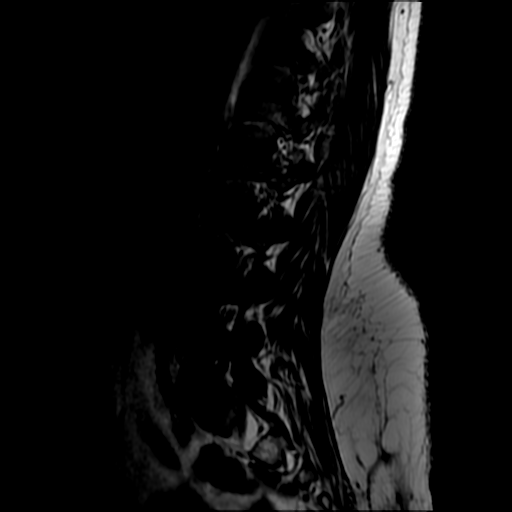
[im 7/12]
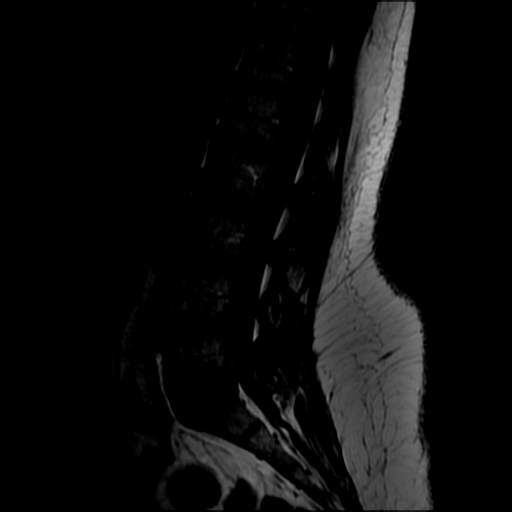
[im 12/12]
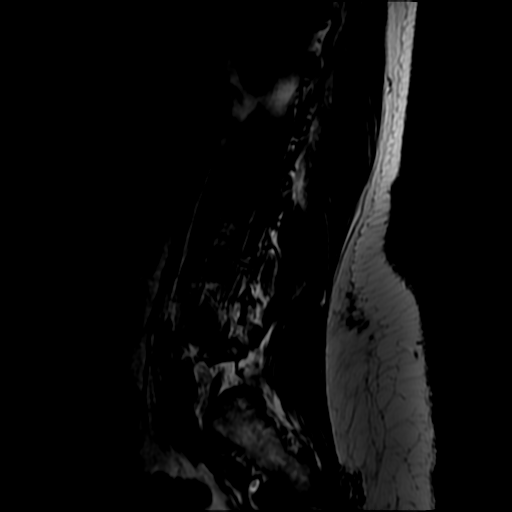

[Series 5: T2 · axial · 4.0mm · 0.39mm/px · z∈[-53,+98]mm · 7 of 31 slices shown (2 of 2)]
[im 1/31]
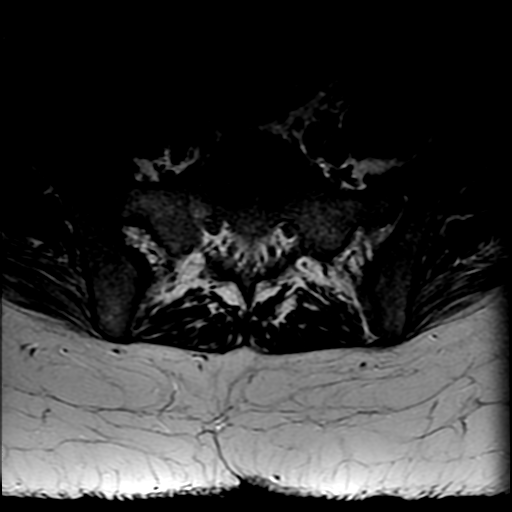
[im 5/31]
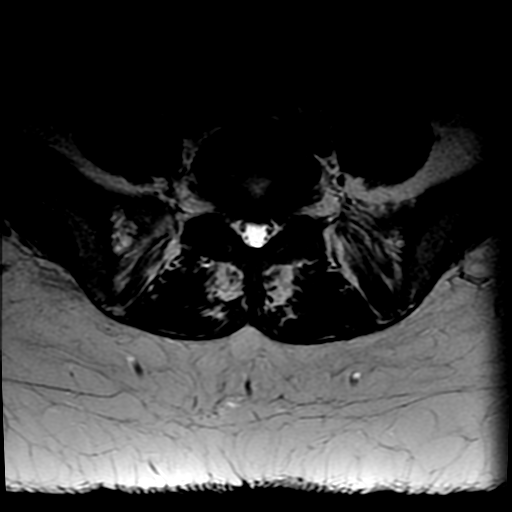
[im 9/31]
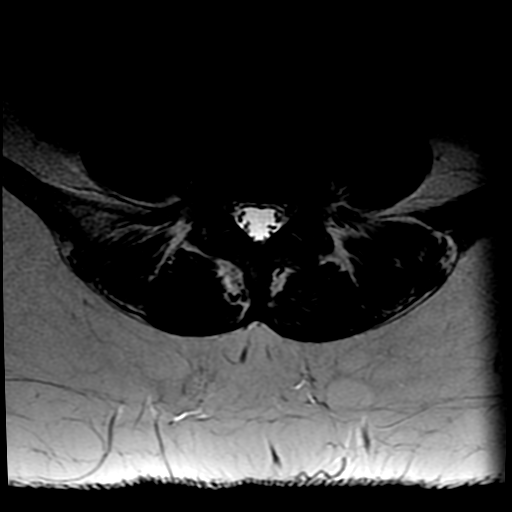
[im 13/31]
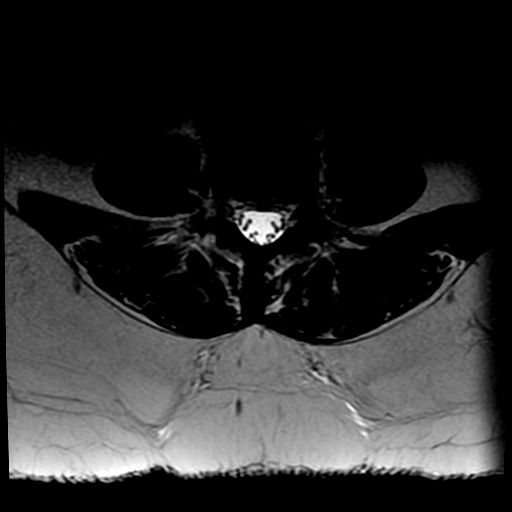
[im 16/31]
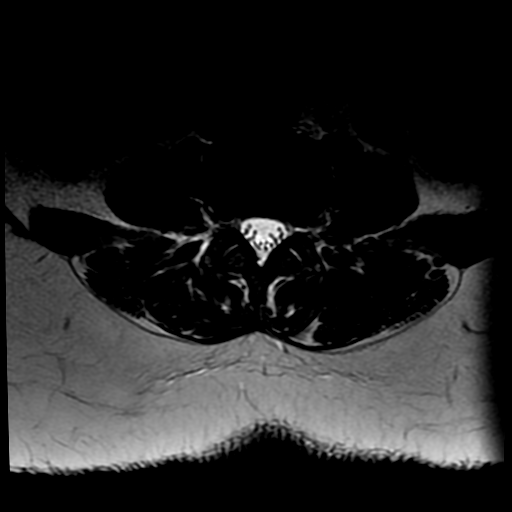
[im 18/31]
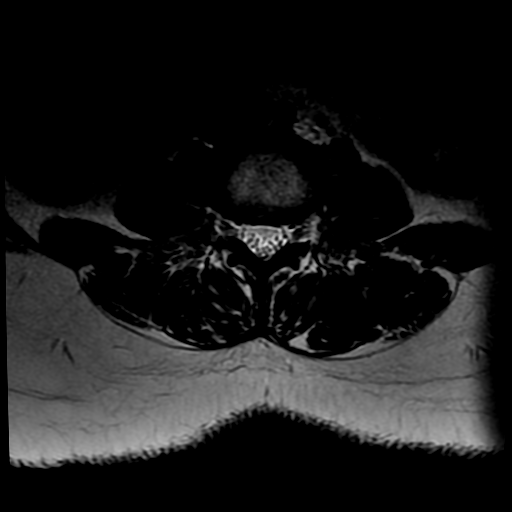
[im 26/31]
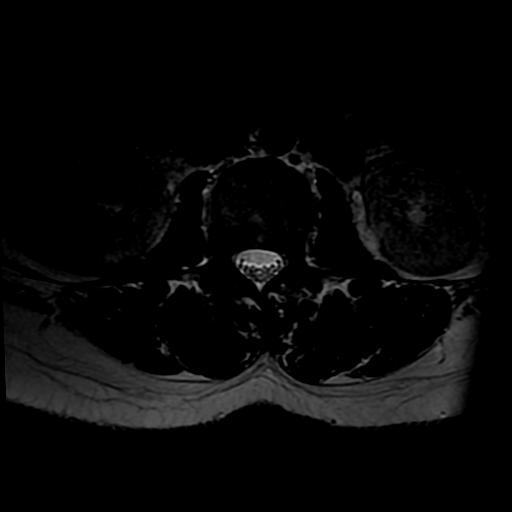

[Series 6: T1 · axial · 4.0mm · 0.39mm/px · z∈[-33,+98]mm · 3 of 31 slices shown (2 of 2)]
[im 5/31]
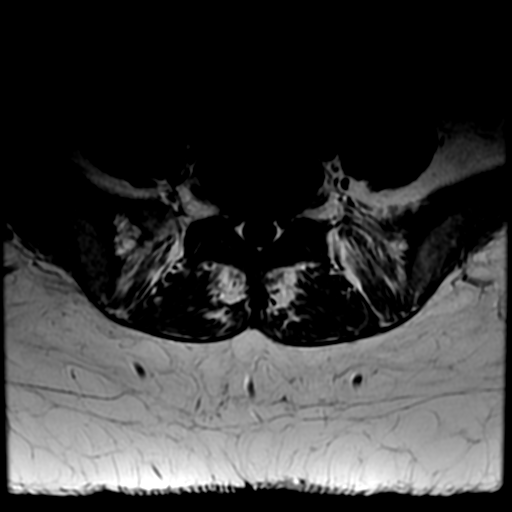
[im 16/31]
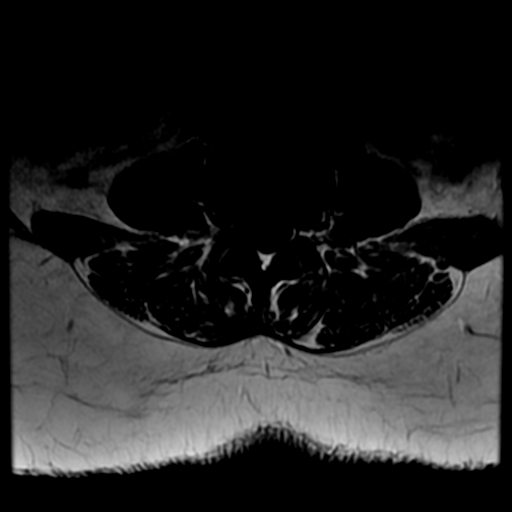
[im 26/31]
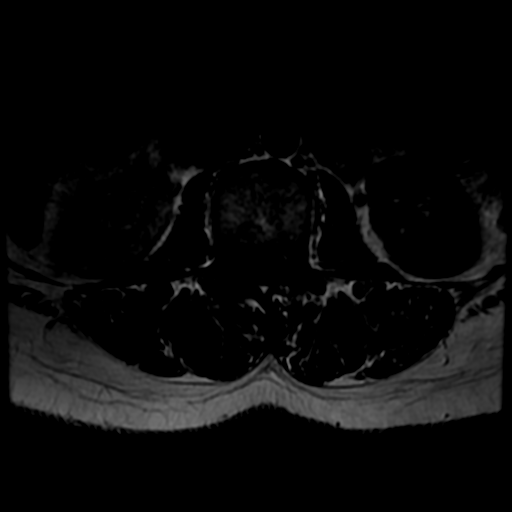

[19 of 48 positions shown; findings below may reference images not displayed]

FINDINGS: Segmentation: The lowest lumbar type non-rib-bearing vertebra is
labeled as L5.

Alignment:  No vertebral subluxation is observed.

Vertebrae: Eccentric to the left in the T12 vertebral there is a
by 1.8 cm lesion with low T1 and low T2 signal characteristics
(image [DATE]) without associated edema on the inversion recovery
weighted images. No definite differential sclerosis in this region
on conventional radiographs.

Conus medullaris: Extends to the T12 level and appears normal.

Paraspinal and other soft tissues: Unremarkable

Disc levels:

L1-2: No impingement. Spina bifida occulta suspected at L1,
eccentric to the left. On conventional radiographs, there is also
suspicion for spina bifida occulta at T12.

L2-3:  Unremarkable.

L3-4:  Unremarkable.

L4-5:  No impingement.  Minimal left eccentric disc bulge.

L5-S1: Mild right foraminal stenosis along with mild bilateral
subarticular lateral recess stenosis due to disc bulge and mild
intervertebral spurring.
IMPRESSION: 1. Degenerative disc disease causing mild right eccentric
impingement at L5-S 1.
2. Spina bifida occulta is suspected at the T12 and L1 levels.
3. Low T1 and low T2 signal lesion eccentric to the left in the T12
vertebral body. Based on appearance I suspect this is much more
likely to represent either a fibrous lesion or red marrow
reconversion rather than osteoblastic metastatic lesion. If the
patient has a history of malignancy, consider whole-body bone scan.
